# Patient Record
Sex: Male | Born: 1940 | Race: Black or African American | Hispanic: No | Marital: Single | Smoking: Former smoker
Health system: Southern US, Community
[De-identification: ages and names within clinical notes are randomized; demographics above are authoritative.]

## PROBLEM LIST (undated history)

## (undated) ENCOUNTER — Emergency Department (HOSPITAL_COMMUNITY): Disposition: A | Discharge status: Home / Self Care | Payer: Self-pay

## (undated) ENCOUNTER — Ambulatory Visit (HOSPITAL_COMMUNITY): Admission: EM | Payer: Medicare Other

## (undated) DIAGNOSIS — C61 Malignant neoplasm of prostate: Secondary | ICD-10-CM

## (undated) DIAGNOSIS — M1990 Unspecified osteoarthritis, unspecified site: Secondary | ICD-10-CM

## (undated) DIAGNOSIS — R3129 Other microscopic hematuria: Secondary | ICD-10-CM

## (undated) DIAGNOSIS — N182 Chronic kidney disease, stage 2 (mild): Secondary | ICD-10-CM

## (undated) DIAGNOSIS — K219 Gastro-esophageal reflux disease without esophagitis: Secondary | ICD-10-CM

## (undated) DIAGNOSIS — N400 Enlarged prostate without lower urinary tract symptoms: Secondary | ICD-10-CM

## (undated) DIAGNOSIS — I10 Essential (primary) hypertension: Secondary | ICD-10-CM

## (undated) DIAGNOSIS — J349 Unspecified disorder of nose and nasal sinuses: Secondary | ICD-10-CM

## (undated) DIAGNOSIS — E079 Disorder of thyroid, unspecified: Secondary | ICD-10-CM

## (undated) DIAGNOSIS — R399 Unspecified symptoms and signs involving the genitourinary system: Secondary | ICD-10-CM

## (undated) DIAGNOSIS — E039 Hypothyroidism, unspecified: Secondary | ICD-10-CM

## (undated) HISTORY — PX: CATARACT EXTRACTION W/ INTRAOCULAR LENS  IMPLANT, BILATERAL: SHX1307

---
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
## 2002-05-19 HISTORY — PX: COLONOSCOPY: SHX174

## 2003-02-06 ENCOUNTER — Ambulatory Visit (HOSPITAL_COMMUNITY): Admission: RE | Admit: 2003-02-06 | Discharge: 2003-02-06 | Payer: Self-pay | Admitting: Gastroenterology

## 2003-10-18 ENCOUNTER — Encounter: Admission: RE | Admit: 2003-10-18 | Discharge: 2003-10-18 | Payer: Self-pay | Admitting: Cardiology

## 2003-10-19 ENCOUNTER — Ambulatory Visit (HOSPITAL_COMMUNITY): Admission: RE | Admit: 2003-10-19 | Discharge: 2003-10-19 | Payer: Self-pay | Admitting: Cardiology

## 2003-12-31 ENCOUNTER — Emergency Department (HOSPITAL_COMMUNITY): Admission: EM | Admit: 2003-12-31 | Discharge: 2003-12-31 | Payer: Self-pay | Admitting: Emergency Medicine

## 2004-07-18 ENCOUNTER — Encounter: Admission: RE | Admit: 2004-07-18 | Discharge: 2004-07-18 | Payer: Self-pay | Admitting: Cardiology

## 2004-11-07 ENCOUNTER — Ambulatory Visit (HOSPITAL_COMMUNITY): Admission: RE | Admit: 2004-11-07 | Discharge: 2004-11-07 | Payer: Self-pay | Admitting: Cardiology

## 2006-05-19 ENCOUNTER — Emergency Department (HOSPITAL_COMMUNITY): Admission: EM | Admit: 2006-05-19 | Discharge: 2006-05-19 | Payer: Self-pay | Admitting: Emergency Medicine

## 2009-05-31 ENCOUNTER — Ambulatory Visit (HOSPITAL_BASED_OUTPATIENT_CLINIC_OR_DEPARTMENT_OTHER): Admission: RE | Admit: 2009-05-31 | Discharge: 2009-05-31 | Payer: Self-pay | Admitting: Cardiology

## 2009-06-09 ENCOUNTER — Ambulatory Visit: Payer: Self-pay | Admitting: Internal Medicine

## 2010-10-04 NOTE — Op Note (Signed)
NAME:  Adrian Ellis, Adrian Ellis                          ACCOUNT NO.:  [id number]   MEDICAL RECORD NO.:  [id number]                   PATIENT TYPE:  AMB   LOCATION:  ENDO                                 FACILITY:  [facility]   PHYSICIAN:  Anselmo Rod, M.D.               DATE OF BIRTH:  08/30/1940   DATE OF PROCEDURE:  02/06/2003  DATE OF DISCHARGE:                                 OPERATIVE REPORT   PROCEDURE PERFORMED:  Screening colonoscopy.   ENDOSCOPIST:  Anselmo Rod, M.D.   INSTRUMENT USED:  Olympus video colonoscope.   INDICATION FOR PROCEDURE:  A 70 year old African-American male undergoing  screening colonoscopy.  Rule out colonic polyps, masses, etc.   PREPROCEDURE PREPARATION:  Informed consent was procured from the patient.  The patient was fasted for eight hours prior to the procedure and prepped  with a bottle of magnesium citrate and a gallon of GoLYTELY the night prior  to the procedure.   PREPROCEDURE PHYSICAL:  VITAL SIGNS:  The patient had stable vital signs.  NECK:  Supple.  CHEST:  Clear to auscultation.  S1, S2 regular.  ABDOMEN:  Soft with normal bowel sounds.   DESCRIPTION OF PROCEDURE:  The patient was placed in the left lateral  decubitus position and sedated with 50 mg of Demerol and 7 mg of Versed  intravenously.  Once the patient was adequately sedate and maintained on low-  flow oxygen and continuous cardiac monitoring, the Olympus video colonoscope  was advanced from the rectum to the cecum with difficulty.  There was a  large amount of residual stool especially in  the cecum and right colon.  The cecal base was not adequately visualized; however, multiple washes were  done, the patient's position was changed from the left lateral to the supine  position, and gentle application of abdominal pressure to reach the cecal  base.  No masses, polyps, erosions, or diverticula were seen.  Small  internal hemorrhoids were seen on retroflexion in the rectum.  The  patient  tolerated the procedure well without complication.   IMPRESSION:  1. Essentially unrevealing colonoscopy except for small internal     hemorrhoids.  2. No masses, polyps, or diverticulosis noted.  3. Large amount of residual stool in the colon, small lesions could have     been missed.    RECOMMENDATIONS:  1. Repeat CRC screening is recommended in the next five years unless the     patient develops any abnormal symptoms in the interim.  2. Outpatient follow-up as need arises in the future.                                               Anselmo Rod, M.D.    JNM/MEDQ  D:  02/06/2003  T:  02/07/2003  Job:  [id number]   cc:   Gabriel Earing, M.D.  [address]  [place]  [place] [address]  Fax: [phone]   Osvaldo Shipper. Spruill, M.D.  [address] [address]  [place]  [place] [address]  Fax: [phone]

## 2012-01-17 ENCOUNTER — Emergency Department (HOSPITAL_COMMUNITY)
Admission: EM | Admit: 2012-01-17 | Discharge: 2012-01-17 | Disposition: A | Discharge status: Home / Self Care | Payer: Medicare Other | Source: Home / Self Care | Attending: Family Medicine | Admitting: Family Medicine

## 2012-01-17 ENCOUNTER — Encounter (HOSPITAL_COMMUNITY): Payer: Self-pay

## 2012-01-17 DIAGNOSIS — J309 Allergic rhinitis, unspecified: Secondary | ICD-10-CM

## 2012-01-17 DIAGNOSIS — N400 Enlarged prostate without lower urinary tract symptoms: Secondary | ICD-10-CM | POA: Insufficient documentation

## 2012-01-17 DIAGNOSIS — J302 Other seasonal allergic rhinitis: Secondary | ICD-10-CM

## 2012-01-17 HISTORY — DX: Disorder of thyroid, unspecified: E07.9

## 2012-01-17 HISTORY — DX: Essential (primary) hypertension: I10

## 2012-01-17 HISTORY — DX: Benign prostatic hyperplasia without lower urinary tract symptoms: N40.0

## 2012-01-17 MED ORDER — FEXOFENADINE HCL 180 MG PO TABS: 180.0000 mg | ORAL_TABLET | Freq: Every day | ORAL | Status: DC

## 2012-01-17 MED ORDER — AZELASTINE HCL 0.1 % NA SOLN: 1.0000 | Freq: Two times a day (BID) | NASAL | Status: DC

## 2012-01-17 NOTE — ED Provider Notes (Signed)
History     CSN: [id number]  Arrival date & time 01/17/12  1128   First MD Initiated Contact with Patient 01/17/12 1220      Chief Complaint  Patient presents with  . Nasal Congestion    nasal congestion started two days ago    (Consider location/radiation/quality/duration/timing/severity/associated sxs/prior treatment) Patient is a 71 y.o. male presenting with URI. The history is provided by the patient.  URI The primary symptoms include sore throat and cough. Primary symptoms do not include fever, nausea, vomiting, myalgias or rash. The current episode started 2 days ago. This is a new problem. The problem has not changed since onset. Symptoms associated with the illness include congestion and rhinorrhea. The illness is not associated with chills.    Past Medical History  Diagnosis Date  . Hypertension   . Thyroid disease   . Prostate enlargement     Past Surgical History  Procedure Date  . Tonsillectomy     No family history on file.  History  Substance Use Topics  . Smoking status: Never Smoker   . Smokeless tobacco: Not on file  . Alcohol Use: No      Review of Systems  Constitutional: Negative for fever and chills.  HENT: Positive for congestion, sore throat, rhinorrhea and postnasal drip.   Respiratory: Positive for cough.   Gastrointestinal: Negative for nausea and vomiting.  Musculoskeletal: Negative for myalgias.  Skin: Negative for rash.    Allergies  Review of patient's allergies indicates no known allergies.  Home Medications   Current Outpatient Rx  Name Route Sig Dispense Refill  . AMLODIPINE-OLMESARTAN 10-20 MG PO TABS Oral Take 1 tablet by mouth daily.    . ASPIRIN 81 MG PO TABS Oral Take 81 mg by mouth daily.    . DUTASTERIDE 0.5 MG PO CAPS Oral Take 0.5 mg by mouth daily.    Marland Kitchen LEVOTHYROXINE SODIUM 100 MCG PO TABS Oral Take 100 mcg by mouth daily.    . CENTRUM PO CHEW Oral Chew 1 tablet by mouth daily.    . AZELASTINE HCL 137  MCG/SPRAY NA SOLN Nasal Place 1 spray into the nose 2 (two) times daily. Use in each nostril as directed 30 mL 1  . FEXOFENADINE HCL 180 MG PO TABS Oral Take 1 tablet (180 mg total) by mouth daily. 30 tablet 1    BP 133/63  Pulse 70  Temp 98.1 F (36.7 C) (Oral)  Resp 20  SpO2 99%  Physical Exam  Nursing note and vitals reviewed. Constitutional: He is oriented to person, place, and time. He appears well-developed and well-nourished.  HENT:  Head: Normocephalic.  Right Ear: External ear normal.  Left Ear: External ear normal.  Nose: Mucosal edema and rhinorrhea present.  Mouth/Throat: Oropharynx is clear and moist.  Eyes: Pupils are equal, round, and reactive to light.  Neck: Normal range of motion. Neck supple.  Cardiovascular: Normal rate, regular rhythm, normal heart sounds and intact distal pulses.   Pulmonary/Chest: Effort normal and breath sounds normal.  Lymphadenopathy:    He has no cervical adenopathy.  Neurological: He is alert and oriented to person, place, and time.  Skin: Skin is warm and dry.  Psychiatric: He has a normal mood and affect.    ED Course  Procedures (including critical care time)  Labs Reviewed - No data to display No results found.   1. Seasonal allergies       MDM          Fayrene Fearing  Sallyanne Kuster, MD 01/17/12 1256

## 2012-01-17 NOTE — ED Notes (Signed)
Pt states he has a "stuffy"nose, with drainage which started a couple of days ago, denies fever/n/v. States throat was sore earlier.

## 2012-05-16 ENCOUNTER — Encounter (HOSPITAL_COMMUNITY): Payer: Self-pay | Admitting: Family Medicine

## 2012-05-16 ENCOUNTER — Emergency Department (HOSPITAL_COMMUNITY)
Admission: EM | Admit: 2012-05-16 | Discharge: 2012-05-16 | Disposition: A | Discharge status: Home / Self Care | Payer: Medicare Other | Attending: Emergency Medicine | Admitting: Emergency Medicine

## 2012-05-16 DIAGNOSIS — R059 Cough, unspecified: Secondary | ICD-10-CM | POA: Insufficient documentation

## 2012-05-16 DIAGNOSIS — I10 Essential (primary) hypertension: Secondary | ICD-10-CM | POA: Insufficient documentation

## 2012-05-16 DIAGNOSIS — Z79899 Other long term (current) drug therapy: Secondary | ICD-10-CM | POA: Insufficient documentation

## 2012-05-16 DIAGNOSIS — Z7982 Long term (current) use of aspirin: Secondary | ICD-10-CM | POA: Insufficient documentation

## 2012-05-16 DIAGNOSIS — E079 Disorder of thyroid, unspecified: Secondary | ICD-10-CM | POA: Insufficient documentation

## 2012-05-16 DIAGNOSIS — J069 Acute upper respiratory infection, unspecified: Secondary | ICD-10-CM | POA: Insufficient documentation

## 2012-05-16 DIAGNOSIS — R6883 Chills (without fever): Secondary | ICD-10-CM | POA: Insufficient documentation

## 2012-05-16 DIAGNOSIS — R05 Cough: Secondary | ICD-10-CM | POA: Insufficient documentation

## 2012-05-16 MED ORDER — GUAIFENESIN 100 MG/5ML PO SYRP: 100.0000 mg | ORAL_SOLUTION | ORAL | Status: DC | PRN

## 2012-05-16 NOTE — ED Provider Notes (Signed)
History    This chart was scribed for [employer]. Rubin Payor, MD, MD by Smitty Pluck, ED Scribe. The patient was seen in room TR06C/TR06C and the patient's care was started at 11:03AM.   CSN: [id number]  Arrival date & time 05/16/12  1308       Chief Complaint  Patient presents with  . Nasal Congestion    (Consider location/radiation/quality/duration/timing/severity/associated sxs/prior treatment) The history is provided by the patient. No language interpreter was used.   Adrian Ellis is a 70 y.o. male who presents to the Emergency Department complaining of constant, moderate nasal congestion onset 1 week ago. He reports having non-productive cough.  Pt denies fevers, SOB, nausea, vomiting, hx of COPD, hx of emphysema and any other symptoms.   Past Medical History  Diagnosis Date  . Hypertension   . Thyroid disease   . Prostate enlargement     Past Surgical History  Procedure Date  . Tonsillectomy     History reviewed. No pertinent family history.  History  Substance Use Topics  . Smoking status: Never Smoker   . Smokeless tobacco: Not on file  . Alcohol Use: No      Review of Systems  Constitutional: Positive for chills. Negative for fever.  HENT: Positive for congestion.   Respiratory: Positive for cough. Negative for shortness of breath.   Gastrointestinal: Negative for nausea and vomiting.  Neurological: Negative for weakness.  All other systems reviewed and are negative.    Allergies  Review of patient's allergies indicates no known allergies.  Home Medications   Current Outpatient Rx  Name  Route  Sig  Dispense  Refill  . AMLODIPINE-OLMESARTAN 10-20 MG PO TABS   Oral   Take 1 tablet by mouth daily.         . ASPIRIN 81 MG PO CHEW   Oral   Chew 81 mg by mouth daily.         . DUTASTERIDE 0.5 MG PO CAPS   Oral   Take 0.5 mg by mouth daily.         Marland Kitchen LEVOTHYROXINE SODIUM 112 MCG PO TABS   Oral   Take 112 mcg by mouth daily.         . ADULT MULTIVITAMIN W/MINERALS CH   Oral   Take 1 tablet by mouth daily.           BP 124/53  Pulse 80  Temp 98.3 F (36.8 C)  Resp 18  SpO2 100%  Physical Exam  Nursing note and vitals reviewed. Constitutional: He is oriented to person, place, and time. He appears well-developed and well-nourished. No distress.  HENT:  Head: Normocephalic and atraumatic.  Mouth/Throat: Oropharynx is clear and moist.  Eyes: EOM are normal.  Neck: Neck supple. No tracheal deviation present.  Cardiovascular: Normal rate.   Pulmonary/Chest: Effort normal. No respiratory distress. He has no wheezes. He has no rales.       Harsh breath sounds  Musculoskeletal: Normal range of motion.  Neurological: He is alert and oriented to person, place, and time.  Skin: Skin is warm and dry.  Psychiatric: He has a normal mood and affect. His behavior is normal.    ED Course  Procedures (including critical care time)   COORDINATION OF CARE: 11:05 AM Discussed ED treatment with pt     Labs Reviewed - No data to display No results found.   No diagnosis found.    MDM  URI symptoms. No focal findings on exam. Do  not believe it strep or influenza. Will discharge home with cough medicine  I personally performed the services described in this documentation, which was scribed in my presence. The recorded information has been reviewed and is accurate.         Juliet Rude. Rubin Payor, MD 05/16/12 [phone]

## 2012-05-16 NOTE — ED Notes (Signed)
Per pt nasal and chest congestion. sts dry cough

## 2012-09-04 ENCOUNTER — Emergency Department (HOSPITAL_COMMUNITY)
Admission: EM | Admit: 2012-09-04 | Discharge: 2012-09-04 | Disposition: A | Discharge status: Home / Self Care | Payer: Medicare Other | Attending: Emergency Medicine | Admitting: Emergency Medicine

## 2012-09-04 ENCOUNTER — Encounter (HOSPITAL_COMMUNITY): Payer: Self-pay | Admitting: Neurology

## 2012-09-04 DIAGNOSIS — Z79899 Other long term (current) drug therapy: Secondary | ICD-10-CM | POA: Insufficient documentation

## 2012-09-04 DIAGNOSIS — I10 Essential (primary) hypertension: Secondary | ICD-10-CM | POA: Insufficient documentation

## 2012-09-04 DIAGNOSIS — Z7982 Long term (current) use of aspirin: Secondary | ICD-10-CM | POA: Insufficient documentation

## 2012-09-04 DIAGNOSIS — E079 Disorder of thyroid, unspecified: Secondary | ICD-10-CM | POA: Insufficient documentation

## 2012-09-04 DIAGNOSIS — Z87448 Personal history of other diseases of urinary system: Secondary | ICD-10-CM | POA: Insufficient documentation

## 2012-09-04 DIAGNOSIS — L989 Disorder of the skin and subcutaneous tissue, unspecified: Secondary | ICD-10-CM

## 2012-09-04 NOTE — ED Notes (Signed)
Pt has small sore to right inner aspect of heel. States yesterday in shower started bleeding. Covered with band aid. A x 4.

## 2012-09-04 NOTE — ED Provider Notes (Signed)
History     CSN: [id number]  Arrival date & time 09/04/12  0917   First MD Initiated Contact with Patient 09/04/12 0930      Chief Complaint  Patient presents with  . Foot Pain    (Consider location/radiation/quality/duration/timing/severity/associated sxs/prior treatment) HPI 72 year old male presents with two skin lesion concerns. For several months if not years he has had a skin tag in his right axilla and for several months if not longer a small lesion that he does not remember what looks like it was on his right medial foot heal and fell off yesterday while he was in the shower and now he just has a small crater in his skin where the lesion was, he has no pain no surrounding redness no pus drainage no weakness no numbness no fever no other concerns whatsoever.No treatment PTA. Past Medical History  Diagnosis Date  . Hypertension   . Thyroid disease   . Prostate enlargement     Past Surgical History  Procedure Laterality Date  . Tonsillectomy      No family history on file.  History  Substance Use Topics  . Smoking status: Never Smoker   . Smokeless tobacco: Not on file  . Alcohol Use: No      Review of Systems See HPI. Allergies  Review of patient's allergies indicates no known allergies.  Home Medications   Current Outpatient Rx  Name  Route  Sig  Dispense  Refill  . amlodipine-olmesartan (AZOR) 10-20 MG per tablet   Oral   Take 1 tablet by mouth daily.         Marland Kitchen aspirin EC 81 MG tablet   Oral   Take 81 mg by mouth daily.         Marland Kitchen dutasteride (AVODART) 0.5 MG capsule   Oral   Take 0.5 mg by mouth daily.         Marland Kitchen levothyroxine (SYNTHROID, LEVOTHROID) 112 MCG tablet   Oral   Take 112 mcg by mouth daily.         . Multiple Vitamin (MULTIVITAMIN WITH MINERALS) TABS   Oral   Take 1 tablet by mouth daily.           BP 144/65  Pulse 80  Temp(Src) 98.2 F (36.8 C) (Oral)  Resp 20  SpO2 98%  Physical Exam  Nursing note and vitals  reviewed. Constitutional:  Awake, alert, nontoxic appearance.  HENT:  Head: Atraumatic.  Eyes: Right eye exhibits no discharge. Left eye exhibits no discharge.  Neck: Neck supple.  Cardiovascular: Normal rate and regular rhythm.   No murmur heard. Pulmonary/Chest: Effort normal and breath sounds normal. No respiratory distress. He has no wheezes. He has no rales. He exhibits no tenderness.  Abdominal: Soft. There is no tenderness. There is no rebound.  Musculoskeletal: He exhibits no tenderness.  Baseline ROM, no obvious new focal weakness.  Neurological: He is alert.  Mental status and motor strength appears baseline for patient and situation.  Skin: No rash noted.  Right axilla has a 1 cm diameter pedunculated well-rounded brown skin tag without erythema tenderness fluctuance or purulent drainage which appears to be low likelihood of a cancerous lesion; his right medial foot at the heel shows a 5 mm diameter well rounded circumscribed shallow ulcer with no erythema no purulence no fluctuance or skin lesion had sloughed spontaneously with residual lesion of uncertain etiology with dorsalis pedis pulse intact normal light touch Refill less than 2 seconds good movement of  his foot no tenderness and also I suspect low likelihood of malignancy however recommend patient follow up with a dermatologist of his choice  Psychiatric: He has a normal mood and affect.    ED Course  Procedures (including critical care time)  Labs Reviewed - No data to display No results found.   1. Foot lesion       MDM  Patient / Family / Caregiver informed of clinical course, understand medical decision-making process, and agree with plan.  I doubt any other EMC precluding discharge at this time including, but not necessarily limited to the following:SBI.         Hurman Horn, MD 09/10/12 [phone]

## 2013-04-23 ENCOUNTER — Observation Stay (HOSPITAL_COMMUNITY)
Admission: EM | Admit: 2013-04-23 | Discharge: 2013-04-24 | Disposition: A | Discharge status: Home / Self Care | Payer: Medicare Other | Attending: Internal Medicine | Admitting: Internal Medicine

## 2013-04-23 ENCOUNTER — Other Ambulatory Visit: Payer: Self-pay

## 2013-04-23 ENCOUNTER — Encounter (HOSPITAL_COMMUNITY): Payer: Self-pay | Admitting: Emergency Medicine

## 2013-04-23 DIAGNOSIS — Z87891 Personal history of nicotine dependence: Secondary | ICD-10-CM | POA: Insufficient documentation

## 2013-04-23 DIAGNOSIS — Z79899 Other long term (current) drug therapy: Secondary | ICD-10-CM | POA: Insufficient documentation

## 2013-04-23 DIAGNOSIS — N400 Enlarged prostate without lower urinary tract symptoms: Secondary | ICD-10-CM

## 2013-04-23 DIAGNOSIS — R55 Syncope and collapse: Principal | ICD-10-CM | POA: Insufficient documentation

## 2013-04-23 DIAGNOSIS — I10 Essential (primary) hypertension: Secondary | ICD-10-CM | POA: Insufficient documentation

## 2013-04-23 DIAGNOSIS — J329 Chronic sinusitis, unspecified: Secondary | ICD-10-CM | POA: Insufficient documentation

## 2013-04-23 DIAGNOSIS — R42 Dizziness and giddiness: Secondary | ICD-10-CM | POA: Insufficient documentation

## 2013-04-23 DIAGNOSIS — Z7982 Long term (current) use of aspirin: Secondary | ICD-10-CM | POA: Insufficient documentation

## 2013-04-23 DIAGNOSIS — E079 Disorder of thyroid, unspecified: Secondary | ICD-10-CM | POA: Insufficient documentation

## 2013-04-23 HISTORY — DX: Unspecified disorder of nose and nasal sinuses: J34.9

## 2013-04-23 LAB — CBC WITH DIFFERENTIAL/PLATELET
Basophils Relative: 0 % (ref 0–1)
Eosinophils Absolute: 0.2 10*3/uL (ref 0.0–0.7)
HCT: 40 % (ref 39.0–52.0)
Lymphocytes Relative: 15 % (ref 12–46)
MCV: 92 fL (ref 78.0–100.0)
Neutro Abs: 8.2 10*3/uL — ABNORMAL HIGH (ref 1.7–7.7)
Platelets: 216 10*3/uL (ref 150–400)

## 2013-04-23 LAB — CBC
HCT: 39.6 % (ref 39.0–52.0)
Hemoglobin: 13.4 g/dL (ref 13.0–17.0)
MCHC: 33.8 g/dL (ref 30.0–36.0)
RBC: 4.31 MIL/uL (ref 4.22–5.81)
WBC: 10 10*3/uL (ref 4.0–10.5)

## 2013-04-23 LAB — GLUCOSE, CAPILLARY: Glucose-Capillary: 136 mg/dL — ABNORMAL HIGH (ref 70–99)

## 2013-04-23 LAB — CREATININE, SERUM
Creatinine, Ser: 1.14 mg/dL (ref 0.50–1.35)
GFR calc Af Amer: 72 mL/min — ABNORMAL LOW (ref >90)
GFR calc non Af Amer: 62 mL/min — ABNORMAL LOW (ref >90)

## 2013-04-23 LAB — POCT I-STAT, CHEM 8
Calcium, Ion: 1.21 mmol/L (ref 1.13–1.30)
Chloride: 101 mEq/L (ref 96–112)
Creatinine, Ser: 1.4 mg/dL — ABNORMAL HIGH (ref 0.50–1.35)
Glucose, Bld: 124 mg/dL — ABNORMAL HIGH (ref 70–99)
HCT: 44 % (ref 39.0–52.0)
Hemoglobin: 15 g/dL (ref 13.0–17.0)
Potassium: 4.6 mEq/L (ref 3.5–5.1)
TCO2: 24 mmol/L (ref 0–100)

## 2013-04-23 LAB — TROPONIN I
Troponin I: <0.3 ng/mL (ref <0.30)
Troponin I: <0.3 ng/mL (ref <0.30)

## 2013-04-23 MED ORDER — ENOXAPARIN SODIUM 40 MG/0.4ML ~~LOC~~ SOLN
40.0000 mg | SUBCUTANEOUS | Status: DC
  Administered 2013-04-23: 40 mg via SUBCUTANEOUS
  Filled 2013-04-23 (×2): qty 0.4

## 2013-04-23 MED ORDER — TAMSULOSIN HCL 0.4 MG PO CAPS
0.4000 mg | ORAL_CAPSULE | Freq: Every day | ORAL | Status: DC
  Administered 2013-04-23 – 2013-04-24 (×2): 0.4 mg via ORAL
  Filled 2013-04-23 (×2): qty 1

## 2013-04-23 MED ORDER — IRBESARTAN 150 MG PO TABS
150.0000 mg | ORAL_TABLET | Freq: Every day | ORAL | Status: DC
  Administered 2013-04-24: 150 mg via ORAL
  Filled 2013-04-23: qty 1

## 2013-04-23 MED ORDER — SODIUM CHLORIDE 0.9 % IV SOLN: 250.0000 mL | INTRAVENOUS | Status: DC | PRN

## 2013-04-23 MED ORDER — ACETAMINOPHEN 325 MG PO TABS: 650.0000 mg | ORAL_TABLET | Freq: Four times a day (QID) | ORAL | Status: DC | PRN

## 2013-04-23 MED ORDER — AMLODIPINE-OLMESARTAN 10-20 MG PO TABS: 1.0000 | ORAL_TABLET | Freq: Every day | ORAL | Status: DC

## 2013-04-23 MED ORDER — AMLODIPINE BESYLATE 10 MG PO TABS
10.0000 mg | ORAL_TABLET | Freq: Every day | ORAL | Status: DC
  Administered 2013-04-24: 10 mg via ORAL
  Filled 2013-04-23: qty 1

## 2013-04-23 MED ORDER — ONDANSETRON HCL 4 MG/2ML IJ SOLN: 4.0000 mg | Freq: Four times a day (QID) | INTRAMUSCULAR | Status: DC | PRN

## 2013-04-23 MED ORDER — LEVOTHYROXINE SODIUM 112 MCG PO TABS
112.0000 ug | ORAL_TABLET | Freq: Every day | ORAL | Status: DC
  Administered 2013-04-24: 112 ug via ORAL
  Filled 2013-04-23 (×2): qty 1

## 2013-04-23 MED ORDER — SODIUM CHLORIDE 0.9 % IJ SOLN
3.0000 mL | Freq: Two times a day (BID) | INTRAMUSCULAR | Status: DC
  Administered 2013-04-23 – 2013-04-24 (×3): 3 mL via INTRAVENOUS

## 2013-04-23 MED ORDER — DUTASTERIDE 0.5 MG PO CAPS
0.5000 mg | ORAL_CAPSULE | Freq: Every day | ORAL | Status: DC
  Administered 2013-04-24: 0.5 mg via ORAL
  Filled 2013-04-23: qty 1

## 2013-04-23 MED ORDER — ONDANSETRON HCL 4 MG PO TABS: 4.0000 mg | ORAL_TABLET | Freq: Four times a day (QID) | ORAL | Status: DC | PRN

## 2013-04-23 MED ORDER — ACETAMINOPHEN 650 MG RE SUPP: 650.0000 mg | Freq: Four times a day (QID) | RECTAL | Status: DC | PRN

## 2013-04-23 MED ORDER — ASPIRIN EC 81 MG PO TBEC
81.0000 mg | DELAYED_RELEASE_TABLET | Freq: Every day | ORAL | Status: DC
  Administered 2013-04-24: 81 mg via ORAL
  Filled 2013-04-23: qty 1

## 2013-04-23 MED ORDER — SODIUM CHLORIDE 0.9 % IJ SOLN: 3.0000 mL | INTRAMUSCULAR | Status: DC | PRN

## 2013-04-23 NOTE — H&P (Addendum)
PCP:   Evlyn Courier, MD   Chief Complaint:  Passed out  HPI: 72 year old male with history of hypertension him a BPH who was [place]. Patient had become lightheaded immediately prior to passing out and lost consciousness for brief time. He denies seizure like activity. Does not have any cardiac history denies palpitations. He denies chest pain or shortness of breath. Patient also denies nausea vomiting or diarrhea. Patient has a history of BPH and was started on Flomax yesterday by his urologist,  he took first dose of Flomax last night.  Allergies:  No Known Allergies    Past Medical History  Diagnosis Date  . Hypertension   . Thyroid disease   . Prostate enlargement   . Sinus problem     Past Surgical History  Procedure Laterality Date  . Tonsillectomy      Prior to Admission medications   Medication Sig Start Date End Date Taking? Authorizing Provider  amlodipine-olmesartan (AZOR) 10-20 MG per tablet Take 1 tablet by mouth daily.   Yes Historical Provider, MD  aspirin EC 81 MG tablet Take 81 mg by mouth daily.   Yes Historical Provider, MD  dutasteride (AVODART) 0.5 MG capsule Take 0.5 mg by mouth daily.   Yes Historical Provider, MD  levothyroxine (SYNTHROID, LEVOTHROID) 112 MCG tablet Take 112 mcg by mouth daily.   Yes Historical Provider, MD  Multiple Vitamin (MULTIVITAMIN WITH MINERALS) TABS Take 1 tablet by mouth daily.   Yes Historical Provider, MD  tamsulosin (FLOMAX) 0.4 MG CAPS capsule Take 0.4 mg by mouth daily.   Yes Historical Provider, MD    Social History:  reports that he has quit smoking. He does not have any smokeless tobacco history on file. He reports that he does not drink alcohol or use illicit drugs.  No family history on file.   All the positives are listed in BOLD  Review of Systems:  HEENT: Headache, blurred vision, runny nose, sore throat Neck: Hypothyroidism,  hyperthyroidism,,lymphadenopathy Chest : Shortness of breath, history of COPD, Asthma Heart : Chest pain, history of coronary arterey disease GI:  Nausea, vomiting, diarrhea, constipation, GERD GU: Dysuria, urgency, frequency of urination, hematuria, BPH Neuro: Stroke, seizures, syncope Psych: Depression, anxiety, hallucinations   Physical Exam: Blood pressure 142/60, pulse 75, temperature 97.8 F (36.6 C), temperature source Oral, resp. rate 17, SpO2 98.00%. Constitutional:   Patient is a well-developed and well-nourished male in no acute distress and cooperative with exam. Head: Normocephalic and atraumatic Mouth: Mucus membranes moist Eyes: PERRL, EOMI, conjunctivae normal Neck: Supple, No Thyromegaly Cardiovascular: RRR, S1 normal, S2 normal Pulmonary/Chest: CTAB, no wheezes, rales, or rhonchi Abdominal: Soft. Non-tender, non-distended, bowel sounds are normal, no masses, organomegaly, or guarding present.  Neurological: A&O x3, Strenght is normal and symmetric bilaterally, cranial nerve II-XII are grossly intact, no focal motor deficit, sensory intact to light touch bilaterally.  Extremities : No Cyanosis, Clubbing or Edema   Labs on Admission:  Results for orders placed during the hospital encounter of 04/23/13 (from the past 48 hour(s))  GLUCOSE, CAPILLARY     Status: Abnormal   Collection Time    04/23/13  9:08 AM      Result Value Range   Glucose-Capillary 136 (*) 70 - 99 mg/dL  CBC WITH DIFFERENTIAL     Status: Abnormal   Collection Time    04/23/13 10:10 AM      Result Value Range  WBC 10.3  4.0 - 10.5 K/uL   RBC 4.35  4.22 - 5.81 MIL/uL   Hemoglobin 13.7  13.0 - 17.0 g/dL   HCT 16.1  09.6 - 04.5 %   MCV 92.0  78.0 - 100.0 fL   MCH 31.5  26.0 - 34.0 pg   MCHC 34.3  30.0 - 36.0 g/dL   RDW 40.9  81.1 - 91.4 %   Platelets 216  150 - 400 K/uL   Neutrophils Relative % 79 (*) 43 - 77 %   Neutro Abs 8.2 (*) 1.7 - 7.7 K/uL   Lymphocytes Relative 15  12 - 46 %    Lymphs Abs 1.5  0.7 - 4.0 K/uL   Monocytes Relative 4  3 - 12 %   Monocytes Absolute 0.4  0.1 - 1.0 K/uL   Eosinophils Relative 2  0 - 5 %   Eosinophils Absolute 0.2  0.0 - 0.7 K/uL   Basophils Relative 0  0 - 1 %   Basophils Absolute 0.0  0.0 - 0.1 K/uL  POCT I-STAT TROPONIN I     Status: None   Collection Time    04/23/13 10:20 AM      Result Value Range   Troponin i, poc 0.01  0.00 - 0.08 ng/mL   Comment 3            Comment: Due to the release kinetics of cTnI,     a negative result within the first hours     of the onset of symptoms does not rule out     myocardial infarction with certainty.     If myocardial infarction is still suspected,     repeat the test at appropriate intervals.  POCT I-STAT, CHEM 8     Status: Abnormal   Collection Time    04/23/13 10:23 AM      Result Value Range   Sodium 139  135 - 145 mEq/L   Potassium 4.6  3.5 - 5.1 mEq/L   Chloride 101  96 - 112 mEq/L   BUN 17  6 - 23 mg/dL   Creatinine, Ser 7.82 (*) 0.50 - 1.35 mg/dL   Glucose, Bld 956 (*) 70 - 99 mg/dL   Calcium, Ion 2.13  0.86 - 1.30 mmol/L   TCO2 24  0 - 100 mmol/L   Hemoglobin 15.0  13.0 - 17.0 g/dL   HCT 57.8  46.9 - 62.9 %    Radiological Exams on Admission: No results found.  Assessment/Plan Active Problems:   Syncope Hypertension BPH Hypothyroidism  72 year old male with history of hypertension, BPH will be admitted to telemetry for syncope. We'll obtain 3 sets of cardiac enzymes, 2-D echocardiogram. Patient may have had postural hypotension as he was started on Flomax last night. I will continue the Flomax at this time and also obtain orthostatics every shift. If patient is found to be orthostatic, Flomax should be discontinued. I will also continue antihypertensive regimen including amlodipine and Olmesartan. We'll continue Synthroid for hypothyroidism Will also continue Flomax and Avodart.  EKG: NSR, poor R wave progression  Code status: Presumed full code  Family  discussion: No family at bedside   Time Spent on Admission: 60 min  LAMA,GAGAN S [facility] Pager: [phone] 04/23/2013, 12:45 PM  If 7PM-7AM, please contact night-coverage  [email or web address]  Password [email or web address]

## 2013-04-23 NOTE — ED Notes (Signed)
Admitting MD at bedside.

## 2013-04-23 NOTE — ED Provider Notes (Signed)
CSN: [id number]     Arrival date & time 04/23/13  1308 History   First MD Initiated Contact with Patient 04/23/13 0902     Chief Complaint  Patient presents with  . Loss of Consciousness   (Consider location/radiation/quality/duration/timing/severity/associated sxs/prior Treatment) HPI Patient had syncopal event this morning. Lungs the costs unknown duration. Brought by EMS. He became lightheaded immediately prior to his syncopal event sat down in a chair and then had loss of consciousness. Patient started Flomax last night. He is presently asymptomatic. Denies having had any chest pain abdominal pain no diarrhea no other complaint. Past Medical History  Diagnosis Date  . Hypertension   . Thyroid disease   . Prostate enlargement   . Sinus problem    Past Surgical History  Procedure Laterality Date  . Tonsillectomy     No family history on file. History  Substance Use Topics  . Smoking status: Former Games developer  . Smokeless tobacco: Not on file  . Alcohol Use: No    Review of Systems  Constitutional: Negative.   HENT: Negative.   Respiratory: Negative.   Cardiovascular: Negative.   Gastrointestinal: Negative.   Musculoskeletal: Negative.   Skin: Negative.   Neurological: Positive for light-headedness.  Psychiatric/Behavioral: Negative.   All other systems reviewed and are negative.    Allergies  Review of patient's allergies indicates no known allergies.  Home Medications   Current Outpatient Rx  Name  Route  Sig  Dispense  Refill  . amlodipine-olmesartan (AZOR) 10-20 MG per tablet   Oral   Take 1 tablet by mouth daily.         Marland Kitchen aspirin EC 81 MG tablet   Oral   Take 81 mg by mouth daily.         Marland Kitchen dutasteride (AVODART) 0.5 MG capsule   Oral   Take 0.5 mg by mouth daily.         Marland Kitchen levothyroxine (SYNTHROID, LEVOTHROID) 112 MCG tablet   Oral   Take 112 mcg by mouth daily.         . Multiple Vitamin (MULTIVITAMIN WITH MINERALS) TABS   Oral   Take 1  tablet by mouth daily.         . tamsulosin (FLOMAX) 0.4 MG CAPS capsule   Oral   Take 0.4 mg by mouth daily.          BP 154/46  Pulse 72  Temp(Src) 97.8 F (36.6 C) (Oral)  Resp 16  SpO2 98% Physical Exam  Nursing note and vitals reviewed. Constitutional: He appears well-developed and well-nourished.  HENT:  Head: Normocephalic and atraumatic.  Eyes: Conjunctivae are normal. Pupils are equal, round, and reactive to light.  Neck: Neck supple. No tracheal deviation present. No thyromegaly present.  Cardiovascular: Normal rate and regular rhythm.   No murmur heard. Pulmonary/Chest: Effort normal and breath sounds normal.  Abdominal: Soft. Bowel sounds are normal. He exhibits no distension. There is no tenderness.  Musculoskeletal: Normal range of motion. He exhibits no edema and no tenderness.  Neurological: He is alert. Coordination normal.  Skin: Skin is warm and dry. No rash noted.  Psychiatric: He has a normal mood and affect.    ED Course  Procedures (including critical care time) Labs Review Labs Reviewed  GLUCOSE, CAPILLARY - Abnormal; Notable for the following:    Glucose-Capillary 136 (*)    All other components within normal limits  CBC WITH DIFFERENTIAL   Imaging Review No results found.  EKG Interpretation  None       Date: 04/23/2013  Rate: 80  Rhythm: normal sinus rhythm  QRS Axis: normal  Intervals: normal  ST/T Wave abnormalities: normal  Conduction Disutrbances:none  Narrative Interpretation:   Old EKG Reviewed: none available  Results for orders placed during the hospital encounter of 04/23/13  GLUCOSE, CAPILLARY      Result Value Range   Glucose-Capillary 136 (*) 70 - 99 mg/dL  CBC WITH DIFFERENTIAL      Result Value Range   WBC 10.3  4.0 - 10.5 K/uL   RBC 4.35  4.22 - 5.81 MIL/uL   Hemoglobin 13.7  13.0 - 17.0 g/dL   HCT 16.1  09.6 - 04.5 %   MCV 92.0  78.0 - 100.0 fL   MCH 31.5  26.0 - 34.0 pg   MCHC 34.3  30.0 - 36.0 g/dL    RDW 40.9  81.1 - 91.4 %   Platelets 216  150 - 400 K/uL   Neutrophils Relative % 79 (*) 43 - 77 %   Neutro Abs 8.2 (*) 1.7 - 7.7 K/uL   Lymphocytes Relative 15  12 - 46 %   Lymphs Abs 1.5  0.7 - 4.0 K/uL   Monocytes Relative 4  3 - 12 %   Monocytes Absolute 0.4  0.1 - 1.0 K/uL   Eosinophils Relative 2  0 - 5 %   Eosinophils Absolute 0.2  0.0 - 0.7 K/uL   Basophils Relative 0  0 - 1 %   Basophils Absolute 0.0  0.0 - 0.1 K/uL  POCT I-STAT, CHEM 8      Result Value Range   Sodium 139  135 - 145 mEq/L   Potassium 4.6  3.5 - 5.1 mEq/L   Chloride 101  96 - 112 mEq/L   BUN 17  6 - 23 mg/dL   Creatinine, Ser 7.82 (*) 0.50 - 1.35 mg/dL   Glucose, Bld 956 (*) 70 - 99 mg/dL   Calcium, Ion 2.13  0.86 - 1.30 mmol/L   TCO2 24  0 - 100 mmol/L   Hemoglobin 15.0  13.0 - 17.0 g/dL   HCT 57.8  46.9 - 62.9 %  POCT I-STAT TROPONIN I      Result Value Range   Troponin i, poc 0.01  0.00 - 0.08 ng/mL   Comment 3            No results found.  11:45 AM he is resting comfortably. Asymptomatic  MDM  No diagnosis found. Flomax may be a culprit as a cause of syncope. Spoke with Dr.Lama plan 23 hour observation telemetry Diagnosis #1syncope #2 renal insufficiency    Doug Sou, MD 04/23/13 1149

## 2013-04-23 NOTE — ED Notes (Signed)
Pt arrived from church and was helping cook when bystanders found pt slumped over on sink while standing. They put pt in chair and got pt awake. When they put pt in chair pt was diaphoretic. Pt stated that he started a new medication that makes him pee a lot. Pt currently A&Ox4. Denies any pain. BP-148/72 HR-75 CBG-162 O2sat-98%ra.

## 2013-04-24 DIAGNOSIS — I517 Cardiomegaly: Secondary | ICD-10-CM

## 2013-04-24 LAB — COMPREHENSIVE METABOLIC PANEL
ALT: 24 U/L (ref 0–53)
AST: 17 U/L (ref 0–37)
Albumin: 3.6 g/dL (ref 3.5–5.2)
Calcium: 8.9 mg/dL (ref 8.4–10.5)
Chloride: 102 mEq/L (ref 96–112)
GFR calc Af Amer: 73 mL/min — ABNORMAL LOW (ref >90)
Glucose, Bld: 126 mg/dL — ABNORMAL HIGH (ref 70–99)
Sodium: 139 mEq/L (ref 135–145)
Total Bilirubin: 0.3 mg/dL (ref 0.3–1.2)
Total Protein: 6.6 g/dL (ref 6.0–8.3)

## 2013-04-24 LAB — CBC
Hemoglobin: 13.2 g/dL (ref 13.0–17.0)
MCH: 30.8 pg (ref 26.0–34.0)
MCHC: 33.3 g/dL (ref 30.0–36.0)
Platelets: 229 10*3/uL (ref 150–400)
RDW: 13.6 % (ref 11.5–15.5)

## 2013-04-24 NOTE — Discharge Summary (Signed)
Physician Discharge Summary  Patient ID: Adrian Ellis MRN: [id number] DOB/AGE: 73/07/1940 72 y.o.  Admit date: 04/23/2013 Discharge date: 04/24/2013  Primary Care Physician:  Evlyn Courier, MD  Discharge Diagnoses:    . Syncope . Prostate enlargement Hypertension  Consults: None   Recommendations for Outpatient Follow-up:  If patient continues to have any further symptoms outpatient, Flomax can be discontinued and assess for improvement in his symptoms  Allergies:  No Known Allergies   Discharge Medications:   Medication List         amlodipine-olmesartan 10-20 MG per tablet  Commonly known as:  AZOR  Take 1 tablet by mouth daily.     aspirin EC 81 MG tablet  Take 81 mg by mouth daily.     dutasteride 0.5 MG capsule  Commonly known as:  AVODART  Take 0.5 mg by mouth daily.     levothyroxine 112 MCG tablet  Commonly known as:  SYNTHROID, LEVOTHROID  Take 112 mcg by mouth daily.     multivitamin with minerals Tabs tablet  Take 1 tablet by mouth daily.     tamsulosin 0.4 MG Caps capsule  Commonly known as:  FLOMAX  Take 0.4 mg by mouth daily.         Brief H and P: For complete details please refer to admission H and P, but in brief72 year old male with history of hypertension him a BPH who was brought by EMS who passed out at the church while he was sitting in the chair. Patient had become lightheaded immediately prior to passing out and lost consciousness for brief time. He denies seizure like activity. Does not have any cardiac history denies palpitations. He denied chest pain or shortness of breath. Patient also denied nausea vomiting or diarrhea. Patient has a history of BPH and was started on Flomax yesterday by his urologist, he took first dose of Flomax at night before admission.   Hospital Course:     Syncope possibly vasovagal versus orthostasis secondary to Flomax..  patient was admitted to telemetry floor, ruled out for acute ACS. Cardiac enzymes  remain negative, EKG showed no acute ST-T wave changes suggestive of  ischemia. Patient may have had postural hypotension as he was just started on Flomax the night before. He is already on Avodart, at this point will continue Flomax and follow up outpatient with urology if he continues to have any symptoms. Patient currently feels at baseline. 2-D echo was done which showed EF of 55-60%, normal wall motion, no aortic stenosis or regurgitation.     Prostate enlargement: For now continue Flomax and Avodart, followup with outpatient urology     Hypertension - Currently stable   Day of Discharge BP 138/60  Pulse 64  Temp(Src) 98.1 F (36.7 C) (Oral)  Resp 18  Ht 6' (1.829 m)  Wt 95.573 kg (210 lb 11.2 oz)  BMI 28.57 kg/m2  SpO2 97%  Physical Exam: General: Alert and awake oriented x3 not in any acute distress. CVS: S1-S2 clear no murmur rubs or gallops Chest: clear to auscultation bilaterally, no wheezing rales or rhonchi Abdomen: soft nontender, nondistended, normal bowel sounds Extremities: no cyanosis, clubbing or edema noted bilaterally Neuro: Cranial nerves II-XII intact, no focal neurological deficits   The results of significant diagnostics from this hospitalization (including imaging, microbiology, ancillary and laboratory) are listed below for reference.    LAB RESULTS: Basic Metabolic Panel:  Recent Labs Lab 04/23/13 1023 04/23/13 1523 04/24/13 0130  NA 139  --  139  K 4.6  --  3.9  CL 101  --  102  CO2  --   --  27  GLUCOSE 124*  --  126*  BUN 17  --  15  CREATININE 1.40* 1.14 1.13  CALCIUM  --   --  8.9   Liver Function Tests:  Recent Labs Lab 04/24/13 0130  AST 17  ALT 24  ALKPHOS 64  BILITOT 0.3  PROT 6.6  ALBUMIN 3.6   No results found for this basename: LIPASE, AMYLASE,  in the last 168 hours No results found for this basename: AMMONIA,  in the last 168 hours CBC:  Recent Labs Lab 04/23/13 1010  04/23/13 1523 04/24/13 0130  WBC 10.3  --   10.0 9.2  NEUTROABS 8.2*  --   --   --   HGB 13.7  < > 13.4 13.2  HCT 40.0  < > 39.6 39.6  MCV 92.0  --  91.9 92.3  PLT 216  --  217 229  < > = values in this interval not displayed. Cardiac Enzymes:  Recent Labs Lab 04/23/13 1938 04/24/13 0130  TROPONINI <0.30 <0.30   BNP: No components found with this basename: POCBNP,  CBG:  Recent Labs Lab 04/23/13 0908  GLUCAP 136*    Significant Diagnostic Studies:  No results found.  2D ECHO: Study Conclusions  Left ventricle: The cavity size was normal. Wall thickness was increased in a pattern of mild LVH. Systolic function was normal. The estimated ejection fraction was in the range of 55% to 60%. Wall motion was normal; there were no regional wall motion abnormalities. There was an increased relative contribution of atrial contraction to ventricular filling.    Disposition and Follow-up: Discharge Orders   Future Orders Complete By Expires   Diet - low sodium heart healthy  As directed    Increase activity slowly  As directed        DISPOSITION: Home  DIET:Heart healthy diet    DISCHARGE FOLLOW-UP Follow-up Information   Follow up with HILL,GERALD K, MD. Schedule an appointment as soon as possible for a visit in 10 days. (For hospital followup)    Specialty:  Family Medicine   Contact information:   [address] [place] [address] [phone]       Time spent on Discharge: 35 mins  Signed:   RAI,RIPUDEEP M.D. [facility] 04/24/2013, 11:32 AM Pager: [phone]

## 2013-04-24 NOTE — Progress Notes (Signed)
Pt provided with dc instructions and edcuation. Pt verbalized understanding. Pt has no needs at this time. VI removed with tip intact. Heart monitor cleaned and returned to front. Levonne Spiller, RN

## 2013-04-24 NOTE — Progress Notes (Signed)
Utilization Review Completed.Adrian Ellis T12/11/2012  

## 2013-04-24 NOTE — Progress Notes (Signed)
  Echocardiogram 2D Echocardiogram has been performed.  Adrian Ellis 04/24/2013, 9:55 AM

## 2014-02-23 DIAGNOSIS — C61 Malignant neoplasm of prostate: Secondary | ICD-10-CM

## 2014-02-23 HISTORY — PX: PROSTATE BIOPSY: SHX241

## 2014-02-23 HISTORY — DX: Malignant neoplasm of prostate: C61

## 2014-03-08 ENCOUNTER — Encounter: Payer: Self-pay | Admitting: Radiation Oncology

## 2014-03-08 NOTE — Progress Notes (Signed)
GU Location of Tumor / Histology: prostate adenocarcinoma  If Prostate Cancer, Gleason Score is (3 + 4) and PSA is (6.89 on 01/26/14) 2013 PSA 3.1  Adrian Ellis presented 1 month ago with signs/symptoms of: increasing PSA, urinary urgency, nocturia, weak urinary stream  Biopsies of prostate revealed:  02/23/14  Volume 25.98 cc   Past/Anticipated interventions by urology, if any: surveillance, biopsy  Past/Anticipated interventions by medical oncology, if any: no  Weight changes, if any: no  Bowel/Bladder complaints, if any:  IPSS 22, incomplete emptying, intermittency, urgency, weak stream, straining, nocturia x 3  Nausea/Vomiting, if any: no  Pain issues, if any:  no  SAFETY ISSUES:  Prior radiation? no  Pacemaker/ICD? no  Possible current pregnancy? na  Is the patient on methotrexate? no  Current Complaints / other details:  Widowed, works part time as Sports coach at [employer], lives alone, niece with him today Dr Janice Norrie does not think he is candidate for active surveillance or radical prostatectomy.

## 2014-03-09 ENCOUNTER — Ambulatory Visit
Admission: RE | Admit: 2014-03-09 | Discharge: 2014-03-09 | Disposition: A | Discharge status: Home / Self Care | Payer: Medicare Other | Source: Ambulatory Visit | Attending: Radiation Oncology | Admitting: Radiation Oncology

## 2014-03-09 ENCOUNTER — Encounter: Payer: Self-pay | Admitting: Radiation Oncology

## 2014-03-09 VITALS — BP 152/62 | HR 68 | Temp 97.6°F | Resp 20 | Ht 72.0 in | Wt 218.0 lb

## 2014-03-09 DIAGNOSIS — N529 Male erectile dysfunction, unspecified: Secondary | ICD-10-CM | POA: Insufficient documentation

## 2014-03-09 DIAGNOSIS — I10 Essential (primary) hypertension: Secondary | ICD-10-CM | POA: Insufficient documentation

## 2014-03-09 DIAGNOSIS — Z7982 Long term (current) use of aspirin: Secondary | ICD-10-CM | POA: Insufficient documentation

## 2014-03-09 DIAGNOSIS — E079 Disorder of thyroid, unspecified: Secondary | ICD-10-CM | POA: Insufficient documentation

## 2014-03-09 DIAGNOSIS — C61 Malignant neoplasm of prostate: Secondary | ICD-10-CM | POA: Diagnosis present

## 2014-03-09 DIAGNOSIS — Z87891 Personal history of nicotine dependence: Secondary | ICD-10-CM | POA: Diagnosis not present

## 2014-03-09 DIAGNOSIS — Z791 Long term (current) use of non-steroidal anti-inflammatories (NSAID): Secondary | ICD-10-CM | POA: Diagnosis not present

## 2014-03-09 HISTORY — DX: Other microscopic hematuria: R31.29

## 2014-03-09 HISTORY — DX: Unspecified symptoms and signs involving the genitourinary system: R39.9

## 2014-03-09 HISTORY — DX: Malignant neoplasm of prostate: C61

## 2014-03-09 NOTE — Progress Notes (Signed)
Please see the Nurse Progress Note in the MD Initial Consult Encounter for this patient. 

## 2014-03-09 NOTE — Progress Notes (Signed)
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 male who is seen today through the courtesy of Dr. Janice Norrie for consideration of radiation therapy in the management of his stageT1c intermediate to high-risk adenocarcinoma prostate. He presented with a PSA of 6.89. Dr. Janice Norrie performed ultrasound-guided biopsies on 02/23/2014. He was found to have Gleason 7 (3+4) in 7 of 12 biopsies. He had 5% involvement of one core from right lateral mid gland, 90% of one core from right lateral apex, 30% of one core from the right apex, 40% of one core from the left lateral base, 5% of one core from the left base, 5% of one core from left lateral apex and 10% of one core from left apex. His gland volume was approximately 26 cc. He does have moderate to severe obstructive symptomatology with an I PSS score of 22. He is been on Avodart continuously since 2008. He is on tamsulosin, but takes this intermittently. He has not taken tamsulosin for the past one to 2 weeks. He has erectile dysfunction. No GI difficulties. He seen today with his niece.   PREVIOUS RADIATION THERAPY: No   PAST MEDICAL HISTORY:  has a past medical history of Hypertension; Thyroid disease; Prostate enlargement; Sinus problem; Prostate cancer (02/23/14); Microhematuria; and Lower urinary tract symptoms (LUTS).     PAST SURGICAL HISTORY:  Past Surgical History  Procedure Laterality Date  . Tonsillectomy      age 35  . Prostate biopsy  02/23/14    Gleason 7, volume 25.98 cc     FAMILY HISTORY: family history includes Aneurysm in his mother; Cancer in his brother. Is father died from old age at 93 and his  mother died from a brain aneurysm and 16.   SOCIAL HISTORY:  reports that he has quit smoking. He does not have any smokeless tobacco history on file. He reports that he does not drink alcohol or use illicit drugs. widower for the past 12 years, no children. He worked as a Glass blower/designer for [employer]: Review of patient's allergies indicates no known allergies.   MEDICATIONS:  Current Outpatient Prescriptions  Medication Sig Dispense Refill  . amlodipine-olmesartan (AZOR) 10-20 MG per tablet Take 1 tablet by mouth daily.      Marland Kitchen aspirin EC 81 MG tablet Take 81 mg by mouth daily.      Marland Kitchen dutasteride (AVODART) 0.5 MG capsule Take 0.5 mg by mouth daily.      Marland Kitchen levothyroxine (SYNTHROID, LEVOTHROID) 112 MCG tablet Take 112 mcg by mouth daily.      . Multiple Vitamin (MULTIVITAMIN WITH MINERALS) TABS Take 1 tablet by mouth daily.      . naproxen sodium (ANAPROX) 220 MG tablet Take 220 mg by mouth 2 (two) times daily with a meal.      . tamsulosin (FLOMAX) 0.4 MG CAPS capsule Take 0.4 mg by mouth daily.       No current facility-administered medications for this encounter.     REVIEW OF SYSTEMS:  Pertinent items are noted in HPI.    PHYSICAL EXAM:  height is  6' (1.829 m) and weight is 218 lb (98.884 kg). His oral temperature is 97.6 F (36.4 C). His blood pressure is 152/62 and his pulse is 68. His respiration is 20.   Alert and oriented 73 year old African American male appearing his stated age. Head and neck examination: Grossly unremarkable. Nodes: Without palpable cervical or supraclavicular lymphadenopathy. Chest: Lungs clear. Back: Without spinal or CVA tenderness. Abdomen: Without masses organomegaly. Genitalia: Unremarkable to inspection. Rectal: The prostate gland is normal in size and is without focal induration or nodularity. Extremities: Trace ankle edema.   LABORATORY DATA:  Lab Results  Component Value Date   WBC 9.2 04/24/2013   HGB 13.2 04/24/2013    HCT 39.6 04/24/2013   MCV 92.3 04/24/2013   PLT 229 04/24/2013   Lab Results  Component Value Date   NA 139 04/24/2013   K 3.9 04/24/2013   CL 102 04/24/2013   CO2 27 04/24/2013   Lab Results  Component Value Date   ALT 24 04/24/2013   AST 17 04/24/2013   ALKPHOS 64 04/24/2013   BILITOT 0.3 04/24/2013   PSA 6.89 from 01/26/2014 (corrected 13.8)   IMPRESSION: Stage T1c intermediate to high-risk adenocarcinoma prostate. I explained to the patient and his niece that his prognosis is related to his stage, PSA level, and Gleason score. His stage is favorable. His corrected PSA is 13.8 which is of intermediate favorability and his Gleason score of 7 is also of intermediate favorability. Combined, he can be considered as having intermediate to high-risk disease. Management options include surgery versus close surveillance versus radiation therapy. Radiation therapy options include 5 weeks of external beam followed by seed implantation or 8 weeks of external beam/IMRT. He is not a candidate for seed implantation boost in view of his severe obstructive symptomatology. By default his best option is external beam/IMRT. I do recommend androgen deprivation therapy for his intermediate to high-risk disease. Although 2 years is felt to be the standard duration of androgen deprivation therapy, he can probably do well with 9 months to one year. We discussed the potential acute and late toxicities of radiation therapy and also androgen deprivation therapy. We discussed comfortable bladder filling to reduce urinary toxicity. I spoke with Dr. Janice Norrie to get him set up for his androgen deprivation therapy and also complete his staging workup with a CT scan of his abdomen/pelvis and bone scan. We will schedule him for placement of 3 gold seed markers through Dr. Janice Norrie within the next 2 months and then have him return to see me for a followup visit in 2 months. All of his questions were answered. PLAN: As discussed above.  I spent  60 minutes minutes face to face with the patient and more than 50% of that time was spent in counseling and/or coordination of care.

## 2014-03-10 ENCOUNTER — Other Ambulatory Visit (HOSPITAL_COMMUNITY): Payer: Self-pay | Admitting: Urology

## 2014-03-10 DIAGNOSIS — C61 Malignant neoplasm of prostate: Secondary | ICD-10-CM

## 2014-03-21 ENCOUNTER — Telehealth: Payer: Self-pay | Admitting: *Deleted

## 2014-03-21 ENCOUNTER — Encounter: Payer: Self-pay | Admitting: Radiation Oncology

## 2014-03-21 ENCOUNTER — Encounter (HOSPITAL_COMMUNITY)
Admission: RE | Admit: 2014-03-21 | Discharge: 2014-03-21 | Disposition: A | Discharge status: Home / Self Care | Payer: Medicare Other | Source: Ambulatory Visit | Attending: Urology | Admitting: Urology

## 2014-03-21 ENCOUNTER — Encounter (HOSPITAL_COMMUNITY): Payer: Medicare Other

## 2014-03-21 ENCOUNTER — Other Ambulatory Visit (HOSPITAL_COMMUNITY): Payer: Self-pay | Admitting: Urology

## 2014-03-21 DIAGNOSIS — C61 Malignant neoplasm of prostate: Secondary | ICD-10-CM | POA: Diagnosis not present

## 2014-03-21 MED ORDER — TECHNETIUM TC 99M MEDRONATE IV KIT
26.0000 | PACK | Freq: Once | INTRAVENOUS | Status: AC | PRN
  Administered 2014-03-21: 26 via INTRAVENOUS

## 2014-03-21 NOTE — Telephone Encounter (Signed)
CALLED PATIENT TO INFORM OF GOLD SEED PLACEMENT ON 04-28-14 - ARRIVAL TIME - 3:15 PM @ DR. Janice Norrie' OFFICE AND HIS [place] VISIT WITH DR. Valere Dross ON 05-02-14, LVM FOR A RETURN CALL

## 2014-03-22 ENCOUNTER — Encounter: Payer: Self-pay | Admitting: Radiation Oncology

## 2014-03-22 NOTE — Progress Notes (Signed)
Mailed [facility] letter and other paperwork requested by Dr. Valere Dross on 03/22/14

## 2014-05-01 NOTE — Progress Notes (Addendum)
GU Location of Tumor / Histology: prostate adenocarcinoma  If Prostate Cancer, Gleason Score is (3 + 4) and PSA is (6.89 on 01/26/14) 2013 PSA 3.1  Martina Sinner presented 1 month ago with signs/symptoms of: increasing PSA, urinary urgency, nocturia, weak urinary stream  Biopsies of prostate revealed:  02/23/14 Volume 25.98 cc   Past/Anticipated interventions by urology, if any: surveillance, biopsy  Past/Anticipated interventions by medical oncology, if any: no  Weight changes, if any: no  Bowel/Bladder complaints, if any: IPSS 10, nocturia x 3-4, intermittent stream, urgency Nausea/Vomiting, if any: no  Pain issues, if any: no  SAFETY ISSUES:  Prior radiation? no  Pacemaker/ICD? no  Possible current pregnancy? na  Is the patient on methotrexate? no  Current Complaints / other details: Widowed, works part time as Sports coach at [employer], lives alone Dr Janice Norrie does not think he is candidate for active surveillance or radical prostatectomy.  04/18/14 Received Mills Koller 03/14/14, received Lupron injection 30 mg, next dose in 4 mos per Dr Sammie Bench RN 04/28/14 placement of 3 gold seed fiducial markers, Dr Janice Norrie  Patient states ever since his seed marker placement he has had dark stools. He denies frank bleeding/blood and history of dark stools, last colonoscopy in 2003.

## 2014-05-02 ENCOUNTER — Ambulatory Visit
Admission: RE | Admit: 2014-05-02 | Discharge: 2014-05-02 | Disposition: A | Discharge status: Home / Self Care | Payer: Medicare Other | Source: Ambulatory Visit | Attending: Radiation Oncology | Admitting: Radiation Oncology

## 2014-05-02 ENCOUNTER — Telehealth: Payer: Self-pay | Admitting: *Deleted

## 2014-05-02 ENCOUNTER — Encounter: Payer: Self-pay | Admitting: Radiation Oncology

## 2014-05-02 VITALS — BP 150/65 | HR 70 | Temp 97.6°F | Resp 20 | Wt 220.2 lb

## 2014-05-02 DIAGNOSIS — C61 Malignant neoplasm of prostate: Secondary | ICD-10-CM

## 2014-05-02 NOTE — Progress Notes (Signed)
CC: Dr. Lowella Bandy, Dr. Iona Beard  Follow-up note:  Diagnosis: Stage TIc intermediate to high risk adenocarcinoma prostate  History: Mr. Cabello returns today for review and scheduling of his radiation therapy in the management of his stage TIc intermediate to high risk adenocarcinoma prostate.  He presented with a PSA of 6.89. Dr. Janice Norrie performed ultrasound-guided biopsies on 02/23/2014. He was found to have Gleason 7 (3+4) in 7 of 12 biopsies. He had 5% involvement of one core from right lateral mid gland, 90% of one core from right lateral apex, 30% of one core from the right apex, 40% of one core from the left lateral base, 5% of one core from the left base, 5% of one core from left lateral apex and 10% of one core from left apex. His gland volume was approximately 26 cc. He has moderate to severe obstructive symptomatology with a presenting I PSS score of 22.  He elected for androgen deprivation therapy for one to 2 years with external beam/IMRT.  He was started on androgen deprivation therapy with Firmagon on 03/14/2014.  Dr. Janice Norrie was kind enough to place gold markers for image guidance on December 11.  After placement of his gold markers, he tells me that he needs to sit down when urinating because of a sense of rectal urgency.  He tells me that he has been having dark stools for the past month which predated placement of his gold seed markers.  He tells me that his last colonoscopy was in 2003.  He does admit to hot flashes as expected.  He tells me he will see Dr. Berdine Addison for a follow-up visit next week.  Physical examination: Alert and oriented. Filed Vitals:   05/02/14 0832  BP: 150/65  Pulse: 70  Temp: 97.6 F (36.4 C)  Resp: 20   Rectal examination: The prostate gland is normal in size and is without focal induration or nodularity.  Impression: Stage TIc intermediate to high risk adenocarcinoma prostate.  I reviewed with Mr. Veiga once again the potential acute and late toxicities of  radiation therapy.  We also discussed bladder filling to minimize urinary toxicity.  Consent is signed today.  We will have him return for CT simulation/treatment planning in mid January.  Knowing that he has difficulty reading, I made diagrams to explain his radiation therapy, treatment planning process, and bladder filling instructions.  I told Mr. Belue up with copy my note to Dr. Berdine Addison to see if he needs to undergo GI evaluation if he indeed has GI bleeding.  The start of radiation therapy can certainly be delayed.  Plan: Visit with Dr. Berdine Addison next week, and if everything is satisfactory, Mr. Tigert to return here for CT simulation in mid January.  30 minutes was spent face-to-face with the patient, primarily counseling patient and coordinating his care.

## 2014-05-02 NOTE — Telephone Encounter (Signed)
Returned vm from patient. He questioned if this nurse called him. This RN assured pt she did not call him. Patient had recently called, spoke with Verdell Carmine, medical secretary and was transferred to ct sim to speak with Alm Bustard, RT. He states [place] said "she was busy and would call him back". Assured Mr Longton that Creed Copper will call him back. He verbalized understanding.

## 2014-05-22 DIAGNOSIS — K921 Melena: Secondary | ICD-10-CM | POA: Diagnosis not present

## 2014-05-29 ENCOUNTER — Ambulatory Visit
Admission: RE | Admit: 2014-05-29 | Discharge: 2014-05-29 | Disposition: A | Discharge status: Home / Self Care | Payer: Medicare Other | Source: Ambulatory Visit | Attending: Radiation Oncology | Admitting: Radiation Oncology

## 2014-05-29 DIAGNOSIS — C61 Malignant neoplasm of prostate: Secondary | ICD-10-CM | POA: Insufficient documentation

## 2014-05-29 DIAGNOSIS — Z510 Encounter for antineoplastic radiation therapy: Secondary | ICD-10-CM | POA: Insufficient documentation

## 2014-05-29 NOTE — Progress Notes (Signed)
Complex simulation/treatment planning note: The patient was taken to the CT simulator.  A Vac lock immobilization device was constructed.  A red rubber tube was placed within the rectal vault.  He was then catheterized and contrast instilled into the bladder/urethra.  The CT data set was sent to the  MIM system where I contoured his prostate, proximal seminal vesicles, bladder, rectum, and rectosigmoid colon.  I'm prescribing 7800 cGy in 40 sessions to his prostate PTV which represents the prostate +0.8 cm except for 0.5 cm along the rectum.  I prescribing 5600 cGy in 40 sessions to his seminal vesicle PTV which represents the seminal vesicles +0.5 cm.  He is now ready for IMRT simulation/treatment planning.

## 2014-05-30 ENCOUNTER — Encounter: Payer: Self-pay | Admitting: Radiation Oncology

## 2014-05-30 DIAGNOSIS — C61 Malignant neoplasm of prostate: Secondary | ICD-10-CM | POA: Diagnosis not present

## 2014-05-30 DIAGNOSIS — Z510 Encounter for antineoplastic radiation therapy: Secondary | ICD-10-CM | POA: Diagnosis not present

## 2014-05-30 NOTE — Progress Notes (Signed)
MRT simulation/treatment planning note: The patient completed IMRT simulation/treatment planning in the management of his carcinoma the prostate.  IMRT was chosen to decrease the risk for both acute and late bladder and rectal toxicity compared to 3-D conformal or conventional radiation therapy.  Dose volume histograms were obtained for the target structures including the prostate PTV which represents the prostate +0.8 cm except for 0.5 cm along the rectum.  We met our departmental goals.  Dose volume histograms were also obtained for the avoidance structures including the bladder, rectum, and femoral heads.  We also met our departmental goals.  I prescribing 7800 cGy in 40 sessions to his prostate PTV and 5600 cGy in 40 sessions to his seminal vesicle PTV which represents the seminal vesicles +0.5 cm.

## 2014-05-31 DIAGNOSIS — C61 Malignant neoplasm of prostate: Secondary | ICD-10-CM | POA: Diagnosis not present

## 2014-05-31 DIAGNOSIS — Z510 Encounter for antineoplastic radiation therapy: Secondary | ICD-10-CM | POA: Diagnosis not present

## 2014-06-07 ENCOUNTER — Ambulatory Visit
Admission: RE | Admit: 2014-06-07 | Discharge: 2014-06-07 | Disposition: A | Discharge status: Home / Self Care | Payer: Medicare Other | Source: Ambulatory Visit | Attending: Radiation Oncology | Admitting: Radiation Oncology

## 2014-06-07 DIAGNOSIS — C61 Malignant neoplasm of prostate: Secondary | ICD-10-CM

## 2014-06-07 DIAGNOSIS — Z510 Encounter for antineoplastic radiation therapy: Secondary | ICD-10-CM | POA: Diagnosis not present

## 2014-06-07 NOTE — Progress Notes (Signed)
Chart note: The patient began his VMAT IMRT today in the management of his carcinoma the prostate.  He is being treated with dual ARC VMAT with 2 dynamic sets of MLCs corresponding to one IMRT treatment devices (90903).

## 2014-06-08 ENCOUNTER — Ambulatory Visit
Admission: RE | Admit: 2014-06-08 | Discharge: 2014-06-08 | Disposition: A | Discharge status: Home / Self Care | Payer: Medicare Other | Source: Ambulatory Visit | Attending: Radiation Oncology | Admitting: Radiation Oncology

## 2014-06-08 DIAGNOSIS — C61 Malignant neoplasm of prostate: Secondary | ICD-10-CM | POA: Diagnosis not present

## 2014-06-08 DIAGNOSIS — Z510 Encounter for antineoplastic radiation therapy: Secondary | ICD-10-CM | POA: Diagnosis not present

## 2014-06-09 ENCOUNTER — Ambulatory Visit: Payer: Medicare Other

## 2014-06-12 ENCOUNTER — Encounter: Payer: Self-pay | Admitting: Radiation Oncology

## 2014-06-12 ENCOUNTER — Ambulatory Visit
Admission: RE | Admit: 2014-06-12 | Discharge: 2014-06-12 | Disposition: A | Discharge status: Home / Self Care | Payer: Medicare Other | Source: Ambulatory Visit | Attending: Radiation Oncology | Admitting: Radiation Oncology

## 2014-06-12 VITALS — BP 132/62 | HR 73 | Temp 97.5°F | Resp 12 | Wt 224.0 lb

## 2014-06-12 DIAGNOSIS — C61 Malignant neoplasm of prostate: Secondary | ICD-10-CM | POA: Diagnosis not present

## 2014-06-12 DIAGNOSIS — Z510 Encounter for antineoplastic radiation therapy: Secondary | ICD-10-CM | POA: Diagnosis not present

## 2014-06-12 NOTE — Progress Notes (Signed)
He is currently in no pain. Pt complains of loss of sleep Pt reports urinary frequency, urgency and hot flashes. Pt states they urinate 2 - 3 times per night.  Pt reports Diarrhea over the weekend. BP 132/62 mmHg  Pulse 73  Temp(Src) 97.5 F (36.4 C) (Oral)  Resp 12  Wt 224 lb (101.606 kg)  SpO2 100%

## 2014-06-12 NOTE — Progress Notes (Signed)
Weekly Management Note:  Site: Prostate Current Dose:  585  cGy Projected Dose: 7800  cGy  Narrative: The patient is seen today for routine under treatment assessment. CBCT/MVCT images/port films were reviewed. The chart was reviewed.   Bladder filling is satisfactory.  No new GU or GI difficulties.  Physical Examination:  Filed Vitals:   06/12/14 1501  BP: 132/62  Pulse: 73  Temp: 97.5 F (36.4 C)  Resp: 12  .  Weight: 224 lb (101.606 kg).  No change.  Impression: Tolerating radiation therapy well.  Plan: Continue radiation therapy as planned.

## 2014-06-13 ENCOUNTER — Ambulatory Visit
Admission: RE | Admit: 2014-06-13 | Discharge: 2014-06-13 | Disposition: A | Discharge status: Home / Self Care | Payer: Medicare Other | Source: Ambulatory Visit | Attending: Radiation Oncology | Admitting: Radiation Oncology

## 2014-06-13 DIAGNOSIS — C61 Malignant neoplasm of prostate: Secondary | ICD-10-CM | POA: Diagnosis not present

## 2014-06-13 DIAGNOSIS — Z510 Encounter for antineoplastic radiation therapy: Secondary | ICD-10-CM | POA: Diagnosis not present

## 2014-06-14 ENCOUNTER — Ambulatory Visit
Admission: RE | Admit: 2014-06-14 | Discharge: 2014-06-14 | Disposition: A | Discharge status: Home / Self Care | Payer: Medicare Other | Source: Ambulatory Visit | Attending: Radiation Oncology | Admitting: Radiation Oncology

## 2014-06-14 DIAGNOSIS — Z510 Encounter for antineoplastic radiation therapy: Secondary | ICD-10-CM | POA: Diagnosis not present

## 2014-06-14 DIAGNOSIS — C61 Malignant neoplasm of prostate: Secondary | ICD-10-CM | POA: Diagnosis not present

## 2014-06-15 ENCOUNTER — Ambulatory Visit
Admission: RE | Admit: 2014-06-15 | Discharge: 2014-06-15 | Disposition: A | Discharge status: Home / Self Care | Payer: Medicare Other | Source: Ambulatory Visit | Attending: Radiation Oncology | Admitting: Radiation Oncology

## 2014-06-15 DIAGNOSIS — Z510 Encounter for antineoplastic radiation therapy: Secondary | ICD-10-CM | POA: Diagnosis not present

## 2014-06-15 DIAGNOSIS — C61 Malignant neoplasm of prostate: Secondary | ICD-10-CM | POA: Diagnosis not present

## 2014-06-16 ENCOUNTER — Ambulatory Visit
Admission: RE | Admit: 2014-06-16 | Discharge: 2014-06-16 | Disposition: A | Discharge status: Home / Self Care | Payer: Medicare Other | Source: Ambulatory Visit | Attending: Radiation Oncology | Admitting: Radiation Oncology

## 2014-06-16 DIAGNOSIS — Z510 Encounter for antineoplastic radiation therapy: Secondary | ICD-10-CM | POA: Diagnosis not present

## 2014-06-16 DIAGNOSIS — C61 Malignant neoplasm of prostate: Secondary | ICD-10-CM | POA: Diagnosis not present

## 2014-06-19 ENCOUNTER — Encounter: Payer: Self-pay | Admitting: Radiation Oncology

## 2014-06-19 ENCOUNTER — Ambulatory Visit
Admission: RE | Admit: 2014-06-19 | Discharge: 2014-06-19 | Disposition: A | Discharge status: Home / Self Care | Payer: Medicare Other | Source: Ambulatory Visit | Attending: Radiation Oncology | Admitting: Radiation Oncology

## 2014-06-19 VITALS — BP 143/67 | HR 73 | Temp 97.7°F | Resp 12 | Wt 227.7 lb

## 2014-06-19 DIAGNOSIS — Z510 Encounter for antineoplastic radiation therapy: Secondary | ICD-10-CM | POA: Diagnosis not present

## 2014-06-19 DIAGNOSIS — C61 Malignant neoplasm of prostate: Secondary | ICD-10-CM | POA: Diagnosis not present

## 2014-06-19 NOTE — Progress Notes (Signed)
Weekly Management Note:  Site: Prostate Current Dose:  1560  cGy Projected Dose: 7800  cGy  Narrative: The patient is seen today for routine under treatment assessment. CBCT/MVCT images/port films were reviewed. The chart was reviewed.   Bladder filling is excellent.  No new GU or GI difficulties.  He tells that he does have nocturia 3-4 which is not unusual.  He is on tamsulosin.  He decreased to my attention a localized erythematous rash along his right calf.  This is not pruritic.  The rash does not extend up his leg.  Physical Examination:  Filed Vitals:   06/19/14 1553  BP: 143/67  Pulse: 73  Temp: 97.7 F (36.5 C)  Resp: 12  .  Weight: 227 lb 11.2 oz (103.284 kg).  No change.  There is a 4-5 cm erythematous rash along his right calf.  I'm not sure what this is.  There is no dermatome distribution, and it does not appear to be shingles.  Impression: Tolerating radiation therapy well.  He will see Dr. Berdine Addison later this week for evaluation of his rash at the time of a routine office follow-up visit.  Plan: Continue radiation therapy as planned.

## 2014-06-19 NOTE — Progress Notes (Signed)
He is currently in no pain.  Pt complains of fatigue Pt reports urinary frequency, urgency and hot flashes. Pt states they urinate 3 - 4 times per night.  Pt reports soft bowel movement everyday BP 143/67 mmHg  Pulse 73  Temp(Src) 97.7 F (36.5 C) (Oral)  Resp 12  Wt 227 lb 11.2 oz (103.284 kg)  SpO2 100%

## 2014-06-20 ENCOUNTER — Ambulatory Visit
Admission: RE | Admit: 2014-06-20 | Discharge: 2014-06-20 | Disposition: A | Discharge status: Home / Self Care | Payer: Medicare Other | Source: Ambulatory Visit | Attending: Radiation Oncology | Admitting: Radiation Oncology

## 2014-06-20 DIAGNOSIS — C61 Malignant neoplasm of prostate: Secondary | ICD-10-CM | POA: Diagnosis not present

## 2014-06-20 DIAGNOSIS — Z510 Encounter for antineoplastic radiation therapy: Secondary | ICD-10-CM | POA: Diagnosis not present

## 2014-06-21 ENCOUNTER — Ambulatory Visit
Admission: RE | Admit: 2014-06-21 | Discharge: 2014-06-21 | Disposition: A | Discharge status: Home / Self Care | Payer: Medicare Other | Source: Ambulatory Visit | Attending: Radiation Oncology | Admitting: Radiation Oncology

## 2014-06-21 DIAGNOSIS — C61 Malignant neoplasm of prostate: Secondary | ICD-10-CM | POA: Diagnosis not present

## 2014-06-21 DIAGNOSIS — Z0000 Encounter for general adult medical examination without abnormal findings: Secondary | ICD-10-CM | POA: Diagnosis not present

## 2014-06-21 DIAGNOSIS — Z510 Encounter for antineoplastic radiation therapy: Secondary | ICD-10-CM | POA: Diagnosis not present

## 2014-06-22 ENCOUNTER — Ambulatory Visit
Admission: RE | Admit: 2014-06-22 | Discharge: 2014-06-22 | Disposition: A | Discharge status: Home / Self Care | Payer: Medicare Other | Source: Ambulatory Visit | Attending: Radiation Oncology | Admitting: Radiation Oncology

## 2014-06-22 DIAGNOSIS — C61 Malignant neoplasm of prostate: Secondary | ICD-10-CM | POA: Diagnosis not present

## 2014-06-22 DIAGNOSIS — Z510 Encounter for antineoplastic radiation therapy: Secondary | ICD-10-CM | POA: Diagnosis not present

## 2014-06-23 ENCOUNTER — Ambulatory Visit: Admission: RE | Admit: 2014-06-23 | Payer: Medicare Other | Source: Ambulatory Visit

## 2014-06-23 ENCOUNTER — Ambulatory Visit
Admission: RE | Admit: 2014-06-23 | Discharge: 2014-06-23 | Disposition: A | Discharge status: Home / Self Care | Payer: Medicare Other | Source: Ambulatory Visit | Attending: Radiation Oncology | Admitting: Radiation Oncology

## 2014-06-23 DIAGNOSIS — C61 Malignant neoplasm of prostate: Secondary | ICD-10-CM | POA: Diagnosis not present

## 2014-06-26 ENCOUNTER — Ambulatory Visit
Admission: RE | Admit: 2014-06-26 | Discharge: 2014-06-26 | Disposition: A | Discharge status: Home / Self Care | Payer: Medicare Other | Source: Ambulatory Visit | Attending: Radiation Oncology | Admitting: Radiation Oncology

## 2014-06-26 ENCOUNTER — Encounter: Payer: Self-pay | Admitting: Radiation Oncology

## 2014-06-26 VITALS — BP 144/69 | HR 76 | Temp 98.2°F | Ht 72.0 in | Wt 222.6 lb

## 2014-06-26 DIAGNOSIS — C61 Malignant neoplasm of prostate: Secondary | ICD-10-CM

## 2014-06-26 DIAGNOSIS — Z510 Encounter for antineoplastic radiation therapy: Secondary | ICD-10-CM | POA: Diagnosis not present

## 2014-06-26 NOTE — Progress Notes (Signed)
Weekly Management Note:  Site: Prostate Current Dose:  2535  cGy Projected Dose: 7800  cGy  Narrative: The patient is seen today for routine under treatment assessment. CBCT/MVCT images/port films were reviewed. The chart was reviewed.   Bladder filling is satisfactory.  His most posterior seed along the rectum appears to have migrated somewhat.  No new GU or GI difficulties.  His weight is down 7 pounds over the past week, but his weight last week may have been an error.  We reviewed his diet.  Physical Examination:  Filed Vitals:   06/26/14 1529  BP: 144/69  Pulse: 76  Temp: 98.2 F (36.8 C)  .  Weight: 220 lb 11.2 oz (100.109 kg).  No change.  Impression: Tolerating radiation therapy well.  Plan: Continue radiation therapy as planned.

## 2014-06-26 NOTE — Progress Notes (Signed)
Adrian Ellis has received 13 fractions to his pelvis for prostate cancer. He reports that he voids 3-4 times nightly which is his norm and that , at times, he dribbles and has urgency.  He has lost 7 lbs since 06/19/14 because he is eating mostly vegetables.  Explained the need to increase protein in his diet to decrease weight loss and to prevent fatigue.   Given suggestions on protein rich foods. Family member present.

## 2014-06-27 ENCOUNTER — Ambulatory Visit
Admission: RE | Admit: 2014-06-27 | Discharge: 2014-06-27 | Disposition: A | Discharge status: Home / Self Care | Payer: Medicare Other | Source: Ambulatory Visit | Attending: Radiation Oncology | Admitting: Radiation Oncology

## 2014-06-27 DIAGNOSIS — Z510 Encounter for antineoplastic radiation therapy: Secondary | ICD-10-CM | POA: Diagnosis not present

## 2014-06-27 DIAGNOSIS — C61 Malignant neoplasm of prostate: Secondary | ICD-10-CM | POA: Diagnosis not present

## 2014-06-28 ENCOUNTER — Ambulatory Visit
Admission: RE | Admit: 2014-06-28 | Discharge: 2014-06-28 | Disposition: A | Discharge status: Home / Self Care | Payer: Medicare Other | Source: Ambulatory Visit | Attending: Radiation Oncology | Admitting: Radiation Oncology

## 2014-06-28 DIAGNOSIS — C61 Malignant neoplasm of prostate: Secondary | ICD-10-CM | POA: Diagnosis not present

## 2014-06-28 DIAGNOSIS — Z510 Encounter for antineoplastic radiation therapy: Secondary | ICD-10-CM | POA: Diagnosis not present

## 2014-06-29 ENCOUNTER — Ambulatory Visit
Admission: RE | Admit: 2014-06-29 | Discharge: 2014-06-29 | Disposition: A | Discharge status: Home / Self Care | Payer: Medicare Other | Source: Ambulatory Visit | Attending: Radiation Oncology | Admitting: Radiation Oncology

## 2014-06-29 DIAGNOSIS — Z510 Encounter for antineoplastic radiation therapy: Secondary | ICD-10-CM | POA: Diagnosis not present

## 2014-06-29 DIAGNOSIS — C61 Malignant neoplasm of prostate: Secondary | ICD-10-CM | POA: Diagnosis not present

## 2014-06-29 NOTE — Addendum Note (Signed)
Encounter addended by: Jenene Slicker, RN on: 06/29/2014  3:34 PM<BR>     Documentation filed: Vitals Section

## 2014-06-30 ENCOUNTER — Ambulatory Visit
Admission: RE | Admit: 2014-06-30 | Discharge: 2014-06-30 | Disposition: A | Discharge status: Home / Self Care | Payer: Medicare Other | Source: Ambulatory Visit | Attending: Radiation Oncology | Admitting: Radiation Oncology

## 2014-06-30 DIAGNOSIS — Z510 Encounter for antineoplastic radiation therapy: Secondary | ICD-10-CM | POA: Diagnosis not present

## 2014-06-30 DIAGNOSIS — C61 Malignant neoplasm of prostate: Secondary | ICD-10-CM | POA: Diagnosis not present

## 2014-07-03 ENCOUNTER — Ambulatory Visit
Admission: RE | Admit: 2014-07-03 | Discharge: 2014-07-03 | Disposition: A | Discharge status: Home / Self Care | Payer: Medicare Other | Source: Ambulatory Visit | Attending: Radiation Oncology | Admitting: Radiation Oncology

## 2014-07-03 ENCOUNTER — Encounter: Payer: Self-pay | Admitting: Radiation Oncology

## 2014-07-03 VITALS — BP 137/58 | HR 73 | Temp 98.8°F | Resp 12 | Wt 222.5 lb

## 2014-07-03 DIAGNOSIS — Z510 Encounter for antineoplastic radiation therapy: Secondary | ICD-10-CM | POA: Diagnosis not present

## 2014-07-03 DIAGNOSIS — C61 Malignant neoplasm of prostate: Secondary | ICD-10-CM

## 2014-07-03 NOTE — Progress Notes (Signed)
He is currently in no pain.  Pt complains of fatigue Pt reports urinary frequency, hot flashes and dribbing. Pt states they urinate 2 - 3 times per night.  Pt reports a soft bowel movement everyday/everyother day. BP 137/58 mmHg  Pulse 73  Temp(Src) 98.8 F (37.1 C) (Oral)  Resp 12  Wt 222 lb 8 oz (100.925 kg)  SpO2 100%

## 2014-07-03 NOTE — Progress Notes (Signed)
Weekly Management Note:  Site: Prostate Current Dose:  3315  cGy Projected Dose: 7800  cGy  Narrative: The patient is seen today for routine under treatment assessment. CBCT/MVCT images/port films were reviewed. The chart was reviewed.   Bladder filling is satisfactory.  No new GU or GI difficulties.  He does have nocturia 2-3.  Physical Examination:  Filed Vitals:   07/03/14 1505  BP: 137/58  Pulse: 73  Temp: 98.8 F (37.1 C)  Resp: 12  .  Weight: 222 lb 8 oz (100.925 kg).  No change.  Impression: Tolerating radiation therapy well.  Plan: Continue radiation therapy as planned.

## 2014-07-04 ENCOUNTER — Ambulatory Visit
Admission: RE | Admit: 2014-07-04 | Discharge: 2014-07-04 | Disposition: A | Discharge status: Home / Self Care | Payer: Medicare Other | Source: Ambulatory Visit | Attending: Radiation Oncology | Admitting: Radiation Oncology

## 2014-07-04 DIAGNOSIS — C61 Malignant neoplasm of prostate: Secondary | ICD-10-CM | POA: Diagnosis not present

## 2014-07-04 DIAGNOSIS — Z510 Encounter for antineoplastic radiation therapy: Secondary | ICD-10-CM | POA: Diagnosis not present

## 2014-07-05 ENCOUNTER — Ambulatory Visit
Admission: RE | Admit: 2014-07-05 | Discharge: 2014-07-05 | Disposition: A | Discharge status: Home / Self Care | Payer: Medicare Other | Source: Ambulatory Visit | Attending: Radiation Oncology | Admitting: Radiation Oncology

## 2014-07-05 DIAGNOSIS — Z510 Encounter for antineoplastic radiation therapy: Secondary | ICD-10-CM | POA: Diagnosis not present

## 2014-07-05 DIAGNOSIS — C61 Malignant neoplasm of prostate: Secondary | ICD-10-CM | POA: Diagnosis not present

## 2014-07-06 ENCOUNTER — Ambulatory Visit
Admission: RE | Admit: 2014-07-06 | Discharge: 2014-07-06 | Disposition: A | Discharge status: Home / Self Care | Payer: Medicare Other | Source: Ambulatory Visit | Attending: Radiation Oncology | Admitting: Radiation Oncology

## 2014-07-06 DIAGNOSIS — Z510 Encounter for antineoplastic radiation therapy: Secondary | ICD-10-CM | POA: Diagnosis not present

## 2014-07-06 DIAGNOSIS — C61 Malignant neoplasm of prostate: Secondary | ICD-10-CM | POA: Diagnosis not present

## 2014-07-07 ENCOUNTER — Ambulatory Visit
Admission: RE | Admit: 2014-07-07 | Discharge: 2014-07-07 | Disposition: A | Discharge status: Home / Self Care | Payer: Medicare Other | Source: Ambulatory Visit | Attending: Radiation Oncology | Admitting: Radiation Oncology

## 2014-07-07 DIAGNOSIS — C61 Malignant neoplasm of prostate: Secondary | ICD-10-CM | POA: Diagnosis not present

## 2014-07-07 DIAGNOSIS — Z510 Encounter for antineoplastic radiation therapy: Secondary | ICD-10-CM | POA: Diagnosis not present

## 2014-07-10 ENCOUNTER — Encounter: Payer: Self-pay | Admitting: Radiation Oncology

## 2014-07-10 ENCOUNTER — Ambulatory Visit
Admission: RE | Admit: 2014-07-10 | Discharge: 2014-07-10 | Disposition: A | Discharge status: Home / Self Care | Payer: Medicare Other | Source: Ambulatory Visit | Attending: Radiation Oncology | Admitting: Radiation Oncology

## 2014-07-10 VITALS — BP 125/59 | HR 73 | Temp 98.0°F | Ht 72.0 in | Wt 223.4 lb

## 2014-07-10 DIAGNOSIS — C61 Malignant neoplasm of prostate: Secondary | ICD-10-CM | POA: Diagnosis not present

## 2014-07-10 DIAGNOSIS — Z510 Encounter for antineoplastic radiation therapy: Secondary | ICD-10-CM | POA: Diagnosis not present

## 2014-07-10 NOTE — Progress Notes (Signed)
Weekly Management Note:  Site: Prostate Current Dose:  4290  cGy Projected Dose: 7800  cGy  Narrative: The patient is seen today for routine under treatment assessment. CBCT/MVCT images/port films were reviewed. The chart was reviewed.   Bladder filling is satisfactory, but he still has a fair amount of rectosigmoid gas.  We try to extract some gas on the treatment unit today but we were unsuccessful.  No GI complaints.  His wife tells me that he eats lots of beans.  Physical Examination:  Filed Vitals:   07/10/14 1604  BP: 125/59  Pulse: 73  Temp: 98 F (36.7 C)  .  Weight: 223 lb 6.4 oz (101.334 kg).  No change.  Impression: Tolerating radiation therapy well.  We talked about modifying his diet to control his gas.  I also suggested he take simethicone.  Plan: Continue radiation therapy as planned.

## 2014-07-10 NOTE — Progress Notes (Signed)
Adrian Ellis has received 22 fractions to his pelvis. He states he voids 1-2 times nightly and he reports that he empties completely. He denies any loose or diarrheal stools and no rectal irritation.

## 2014-07-11 ENCOUNTER — Ambulatory Visit
Admission: RE | Admit: 2014-07-11 | Discharge: 2014-07-11 | Disposition: A | Discharge status: Home / Self Care | Payer: Medicare Other | Source: Ambulatory Visit | Attending: Radiation Oncology | Admitting: Radiation Oncology

## 2014-07-11 DIAGNOSIS — C61 Malignant neoplasm of prostate: Secondary | ICD-10-CM | POA: Diagnosis not present

## 2014-07-11 DIAGNOSIS — Z510 Encounter for antineoplastic radiation therapy: Secondary | ICD-10-CM | POA: Diagnosis not present

## 2014-07-12 ENCOUNTER — Ambulatory Visit
Admission: RE | Admit: 2014-07-12 | Discharge: 2014-07-12 | Disposition: A | Discharge status: Home / Self Care | Payer: Medicare Other | Source: Ambulatory Visit | Attending: Radiation Oncology | Admitting: Radiation Oncology

## 2014-07-12 DIAGNOSIS — Z510 Encounter for antineoplastic radiation therapy: Secondary | ICD-10-CM | POA: Diagnosis not present

## 2014-07-12 DIAGNOSIS — C61 Malignant neoplasm of prostate: Secondary | ICD-10-CM | POA: Diagnosis not present

## 2014-07-13 ENCOUNTER — Ambulatory Visit
Admission: RE | Admit: 2014-07-13 | Discharge: 2014-07-13 | Disposition: A | Discharge status: Home / Self Care | Payer: Medicare Other | Source: Ambulatory Visit | Attending: Radiation Oncology | Admitting: Radiation Oncology

## 2014-07-13 DIAGNOSIS — C61 Malignant neoplasm of prostate: Secondary | ICD-10-CM | POA: Diagnosis not present

## 2014-07-13 DIAGNOSIS — Z510 Encounter for antineoplastic radiation therapy: Secondary | ICD-10-CM | POA: Diagnosis not present

## 2014-07-14 ENCOUNTER — Ambulatory Visit
Admission: RE | Admit: 2014-07-14 | Discharge: 2014-07-14 | Disposition: A | Discharge status: Home / Self Care | Payer: Medicare Other | Source: Ambulatory Visit | Attending: Radiation Oncology | Admitting: Radiation Oncology

## 2014-07-14 DIAGNOSIS — Z510 Encounter for antineoplastic radiation therapy: Secondary | ICD-10-CM | POA: Diagnosis not present

## 2014-07-14 DIAGNOSIS — C61 Malignant neoplasm of prostate: Secondary | ICD-10-CM | POA: Diagnosis not present

## 2014-07-17 ENCOUNTER — Ambulatory Visit
Admission: RE | Admit: 2014-07-17 | Discharge: 2014-07-17 | Disposition: A | Discharge status: Home / Self Care | Payer: Medicare Other | Source: Ambulatory Visit | Attending: Radiation Oncology | Admitting: Radiation Oncology

## 2014-07-17 ENCOUNTER — Encounter: Payer: Self-pay | Admitting: Radiation Oncology

## 2014-07-17 VITALS — BP 119/55 | HR 78 | Temp 98.4°F | Resp 12 | Wt 220.1 lb

## 2014-07-17 DIAGNOSIS — C61 Malignant neoplasm of prostate: Secondary | ICD-10-CM

## 2014-07-17 DIAGNOSIS — Z510 Encounter for antineoplastic radiation therapy: Secondary | ICD-10-CM | POA: Diagnosis not present

## 2014-07-17 NOTE — Progress Notes (Signed)
He rates his pain as a 4 on a scale of 0-10. constant, aching and "swollen" over bilateral groin.  Pt complains of fatigue and poor appetite Pt reports urinary frequency, hesistency, hot flashes and dribbling. Pt states they urinate 2 - 3 times per night.  Pt reports a soft bowel movement everyday/everyother day. Reports taking Simethicone for gas which is helping greatly. BP 119/55 mmHg  Pulse 78  Temp(Src) 98.4 F (36.9 C) (Oral)  Resp 12  Wt 220 lb 1.6 oz (99.837 kg)  SpO2 100%

## 2014-07-17 NOTE — Progress Notes (Signed)
Weekly Management Note:  Site: Prostate Current Dose:  5265  cGy Projected Dose: 7800  cGy  Narrative: The patient is seen today for routine under treatment assessment. CBCT/MVCT images/port films were reviewed. The chart was reviewed.   Bladder filling today is less than ideal is satisfactory.  He does omit to some urinary frequency and hesitancy, but this is unchanged.  He has been taking simethicone for gas which has been beneficial.  He continues with his tamsulosin.  Physical Examination:  Filed Vitals:   07/17/14 1511  BP: 119/55  Pulse: 78  Temp: 98.4 F (36.9 C)  Resp: 12  .  Weight: 220 lb 1.6 oz (99.837 kg).  No change.  Impression: Tolerating radiation therapy well.  I encouraged him to improve his bladder filling.  Plan: Continue radiation therapy as planned.

## 2014-07-18 ENCOUNTER — Ambulatory Visit
Admission: RE | Admit: 2014-07-18 | Discharge: 2014-07-18 | Disposition: A | Discharge status: Home / Self Care | Payer: Medicare Other | Source: Ambulatory Visit | Attending: Radiation Oncology | Admitting: Radiation Oncology

## 2014-07-18 DIAGNOSIS — Z510 Encounter for antineoplastic radiation therapy: Secondary | ICD-10-CM | POA: Diagnosis not present

## 2014-07-18 DIAGNOSIS — C61 Malignant neoplasm of prostate: Secondary | ICD-10-CM | POA: Diagnosis not present

## 2014-07-19 ENCOUNTER — Ambulatory Visit
Admission: RE | Admit: 2014-07-19 | Discharge: 2014-07-19 | Disposition: A | Discharge status: Home / Self Care | Payer: Medicare Other | Source: Ambulatory Visit | Attending: Radiation Oncology | Admitting: Radiation Oncology

## 2014-07-19 DIAGNOSIS — Z510 Encounter for antineoplastic radiation therapy: Secondary | ICD-10-CM | POA: Diagnosis not present

## 2014-07-19 DIAGNOSIS — C61 Malignant neoplasm of prostate: Secondary | ICD-10-CM | POA: Diagnosis not present

## 2014-07-20 ENCOUNTER — Ambulatory Visit
Admission: RE | Admit: 2014-07-20 | Discharge: 2014-07-20 | Disposition: A | Discharge status: Home / Self Care | Payer: Medicare Other | Source: Ambulatory Visit | Attending: Radiation Oncology | Admitting: Radiation Oncology

## 2014-07-20 DIAGNOSIS — C61 Malignant neoplasm of prostate: Secondary | ICD-10-CM | POA: Diagnosis not present

## 2014-07-20 DIAGNOSIS — Z510 Encounter for antineoplastic radiation therapy: Secondary | ICD-10-CM | POA: Diagnosis not present

## 2014-07-21 ENCOUNTER — Ambulatory Visit
Admission: RE | Admit: 2014-07-21 | Discharge: 2014-07-21 | Disposition: A | Discharge status: Home / Self Care | Payer: Medicare Other | Source: Ambulatory Visit | Attending: Radiation Oncology | Admitting: Radiation Oncology

## 2014-07-21 DIAGNOSIS — C61 Malignant neoplasm of prostate: Secondary | ICD-10-CM | POA: Diagnosis not present

## 2014-07-21 DIAGNOSIS — Z510 Encounter for antineoplastic radiation therapy: Secondary | ICD-10-CM | POA: Diagnosis not present

## 2014-07-24 ENCOUNTER — Ambulatory Visit: Admission: RE | Admit: 2014-07-24 | Payer: Medicare Other | Source: Ambulatory Visit

## 2014-07-24 ENCOUNTER — Ambulatory Visit
Admission: RE | Admit: 2014-07-24 | Discharge: 2014-07-24 | Disposition: A | Discharge status: Home / Self Care | Payer: Medicare Other | Source: Ambulatory Visit | Attending: Radiation Oncology | Admitting: Radiation Oncology

## 2014-07-24 ENCOUNTER — Ambulatory Visit: Admission: RE | Admit: 2014-07-24 | Payer: Medicare Other | Source: Ambulatory Visit | Admitting: Radiation Oncology

## 2014-07-25 ENCOUNTER — Ambulatory Visit
Admission: RE | Admit: 2014-07-25 | Discharge: 2014-07-25 | Disposition: A | Discharge status: Home / Self Care | Payer: Medicare Other | Source: Ambulatory Visit | Attending: Radiation Oncology | Admitting: Radiation Oncology

## 2014-07-25 VITALS — BP 129/57 | HR 67 | Temp 98.4°F | Ht 72.0 in | Wt 223.2 lb

## 2014-07-25 DIAGNOSIS — C61 Malignant neoplasm of prostate: Secondary | ICD-10-CM | POA: Diagnosis not present

## 2014-07-25 DIAGNOSIS — Z510 Encounter for antineoplastic radiation therapy: Secondary | ICD-10-CM | POA: Diagnosis not present

## 2014-07-25 NOTE — Progress Notes (Signed)
Weekly Management Note:  Site: Prostate Current Dose:  6240  cGy Projected Dose: 7800  cGy  Narrative: The patient is seen today for routine under treatment assessment. CBCT/MVCT images/port films were reviewed. The chart was reviewed.   Bladder filling is satisfactory.  No new GU or GI difficulties.  He has nocturia 3-4.  He continues with tamsulosin.  Physical Examination:  Filed Vitals:   07/25/14 1552  BP: 129/57  Pulse: 67  Temp: 98.4 F (36.9 C)  .  Weight: 223 lb 3.2 oz (101.243 kg).  No change.  Impression: Tolerating radiation therapy well.  Plan: Continue radiation therapy as planned.

## 2014-07-25 NOTE — Progress Notes (Addendum)
Mr. Adrian Ellis has received 32 fractions to his pelvis for prostate cancer. He reports straining to void, Nocturia 3-4 times, softner, more frequent stools x 2 today and "some fatigue". He denies any dysuria, nor proctitis

## 2014-07-26 ENCOUNTER — Ambulatory Visit
Admission: RE | Admit: 2014-07-26 | Discharge: 2014-07-26 | Disposition: A | Discharge status: Home / Self Care | Payer: Medicare Other | Source: Ambulatory Visit | Attending: Radiation Oncology | Admitting: Radiation Oncology

## 2014-07-26 DIAGNOSIS — Z510 Encounter for antineoplastic radiation therapy: Secondary | ICD-10-CM | POA: Diagnosis not present

## 2014-07-26 DIAGNOSIS — C61 Malignant neoplasm of prostate: Secondary | ICD-10-CM | POA: Diagnosis not present

## 2014-07-27 ENCOUNTER — Ambulatory Visit
Admission: RE | Admit: 2014-07-27 | Discharge: 2014-07-27 | Disposition: A | Discharge status: Home / Self Care | Payer: Medicare Other | Source: Ambulatory Visit | Attending: Radiation Oncology | Admitting: Radiation Oncology

## 2014-07-27 DIAGNOSIS — C61 Malignant neoplasm of prostate: Secondary | ICD-10-CM | POA: Diagnosis not present

## 2014-07-27 DIAGNOSIS — Z510 Encounter for antineoplastic radiation therapy: Secondary | ICD-10-CM | POA: Diagnosis not present

## 2014-07-28 ENCOUNTER — Ambulatory Visit
Admission: RE | Admit: 2014-07-28 | Discharge: 2014-07-28 | Disposition: A | Discharge status: Home / Self Care | Payer: Medicare Other | Source: Ambulatory Visit | Attending: Radiation Oncology | Admitting: Radiation Oncology

## 2014-07-28 DIAGNOSIS — Z510 Encounter for antineoplastic radiation therapy: Secondary | ICD-10-CM | POA: Diagnosis not present

## 2014-07-28 DIAGNOSIS — C61 Malignant neoplasm of prostate: Secondary | ICD-10-CM | POA: Diagnosis not present

## 2014-07-31 ENCOUNTER — Ambulatory Visit
Admission: RE | Admit: 2014-07-31 | Discharge: 2014-07-31 | Disposition: A | Discharge status: Home / Self Care | Payer: Medicare Other | Source: Ambulatory Visit | Attending: Radiation Oncology | Admitting: Radiation Oncology

## 2014-07-31 ENCOUNTER — Encounter: Payer: Self-pay | Admitting: Radiation Oncology

## 2014-07-31 DIAGNOSIS — Z510 Encounter for antineoplastic radiation therapy: Secondary | ICD-10-CM | POA: Diagnosis not present

## 2014-07-31 DIAGNOSIS — C61 Malignant neoplasm of prostate: Secondary | ICD-10-CM | POA: Diagnosis not present

## 2014-08-01 ENCOUNTER — Ambulatory Visit: Payer: Medicare Other

## 2014-08-01 ENCOUNTER — Ambulatory Visit
Admission: RE | Admit: 2014-08-01 | Discharge: 2014-08-01 | Disposition: A | Discharge status: Home / Self Care | Payer: Medicare Other | Source: Ambulatory Visit | Attending: Radiation Oncology | Admitting: Radiation Oncology

## 2014-08-01 ENCOUNTER — Encounter: Payer: Self-pay | Admitting: Radiation Oncology

## 2014-08-01 VITALS — BP 139/58 | HR 67 | Temp 97.5°F | Resp 20 | Wt 221.8 lb

## 2014-08-01 DIAGNOSIS — C61 Malignant neoplasm of prostate: Secondary | ICD-10-CM

## 2014-08-01 DIAGNOSIS — Z510 Encounter for antineoplastic radiation therapy: Secondary | ICD-10-CM | POA: Diagnosis not present

## 2014-08-01 NOTE — Progress Notes (Signed)
Patient reports nocturia x 3, denies other urinary issues, denies bowel issues, loss of appetite. He states he has become somewhat fatigued.  BP 139/58 mmHg  Pulse 67  Temp(Src) 97.5 F (36.4 C) (Oral)  Resp 20  Wt 221 lb 12.8 oz (100.608 kg)

## 2014-08-01 NOTE — Progress Notes (Signed)
Weekly Management Note:  Site: Prostate  Current Dose:  7215  cGy Projected Dose: 7800  cGy  Narrative: The patient is seen today for routine under treatment assessment. CBCT/MVCT images/port films were reviewed. The chart was reviewed.   Bladder filling is satisfactory.  No new GU or GI difficulties.  He finishes his radiation therapy this Friday.  Physical Examination:  Filed Vitals:   08/01/14 1539  BP: 139/58  Pulse: 67  Temp: 97.5 F (36.4 C)  Resp: 20  .  Weight: 221 lb 12.8 oz (100.608 kg).  No change.  Impression: Tolerating radiation therapy well.  Plan: Continue radiation therapy as planned.  One-month follow-up visit after completion of radiation therapy this Friday.

## 2014-08-02 ENCOUNTER — Ambulatory Visit: Payer: Medicare Other

## 2014-08-02 ENCOUNTER — Ambulatory Visit
Admission: RE | Admit: 2014-08-02 | Discharge: 2014-08-02 | Disposition: A | Discharge status: Home / Self Care | Payer: Medicare Other | Source: Ambulatory Visit | Attending: Radiation Oncology | Admitting: Radiation Oncology

## 2014-08-02 DIAGNOSIS — Z510 Encounter for antineoplastic radiation therapy: Secondary | ICD-10-CM | POA: Diagnosis not present

## 2014-08-02 DIAGNOSIS — C61 Malignant neoplasm of prostate: Secondary | ICD-10-CM | POA: Diagnosis not present

## 2014-08-03 ENCOUNTER — Ambulatory Visit
Admission: RE | Admit: 2014-08-03 | Discharge: 2014-08-03 | Disposition: A | Discharge status: Home / Self Care | Payer: Medicare Other | Source: Ambulatory Visit | Attending: Radiation Oncology | Admitting: Radiation Oncology

## 2014-08-03 ENCOUNTER — Ambulatory Visit: Payer: Medicare Other

## 2014-08-03 DIAGNOSIS — Z510 Encounter for antineoplastic radiation therapy: Secondary | ICD-10-CM | POA: Diagnosis not present

## 2014-08-03 DIAGNOSIS — C61 Malignant neoplasm of prostate: Secondary | ICD-10-CM | POA: Diagnosis not present

## 2014-08-04 ENCOUNTER — Ambulatory Visit: Payer: Medicare Other

## 2014-08-04 ENCOUNTER — Ambulatory Visit
Admission: RE | Admit: 2014-08-04 | Discharge: 2014-08-04 | Disposition: A | Discharge status: Home / Self Care | Payer: Medicare Other | Source: Ambulatory Visit | Attending: Radiation Oncology | Admitting: Radiation Oncology

## 2014-08-04 DIAGNOSIS — Z510 Encounter for antineoplastic radiation therapy: Secondary | ICD-10-CM | POA: Diagnosis not present

## 2014-08-04 DIAGNOSIS — C61 Malignant neoplasm of prostate: Secondary | ICD-10-CM | POA: Diagnosis not present

## 2014-08-06 ENCOUNTER — Encounter: Payer: Self-pay | Admitting: Radiation Oncology

## 2014-08-06 NOTE — Progress Notes (Signed)
[facility] End of Treatment Note  Name:Adrian Ellis  Date: 08/06/2014 YBO:[id number] DOB:[birth year]   Status:outpatient    CC: Maggie Font, MD  Dr. Lowella Bandy  REFERRING PHYSICIAN:   Dr. 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 tolerated his treatment well with no significant GU or GI toxicity by completion of therapy.  He remains on tamsulosin.  As we discussed earlier, he should continue with androgen deprivation therapy for a minimum of 9-12 months for his intermediate to high-risk disease.                      PLAN: Routine followup in one month. Patient instructed to call if questions or worsening complaints in interim.

## 2014-08-08 DIAGNOSIS — H25813 Combined forms of age-related cataract, bilateral: Secondary | ICD-10-CM | POA: Diagnosis not present

## 2014-08-10 ENCOUNTER — Ambulatory Visit: Payer: Medicare Other

## 2014-08-15 DIAGNOSIS — I10 Essential (primary) hypertension: Secondary | ICD-10-CM | POA: Diagnosis not present

## 2014-08-15 DIAGNOSIS — E039 Hypothyroidism, unspecified: Secondary | ICD-10-CM | POA: Diagnosis not present

## 2014-08-18 DIAGNOSIS — C61 Malignant neoplasm of prostate: Secondary | ICD-10-CM | POA: Diagnosis not present

## 2014-08-18 DIAGNOSIS — Z5111 Encounter for antineoplastic chemotherapy: Secondary | ICD-10-CM | POA: Diagnosis not present

## 2014-08-19 DIAGNOSIS — R42 Dizziness and giddiness: Secondary | ICD-10-CM | POA: Diagnosis not present

## 2014-09-12 DIAGNOSIS — I10 Essential (primary) hypertension: Secondary | ICD-10-CM | POA: Diagnosis not present

## 2014-10-30 DIAGNOSIS — C61 Malignant neoplasm of prostate: Secondary | ICD-10-CM | POA: Diagnosis not present

## 2014-11-03 DIAGNOSIS — C61 Malignant neoplasm of prostate: Secondary | ICD-10-CM | POA: Diagnosis not present

## 2014-12-05 DIAGNOSIS — I10 Essential (primary) hypertension: Secondary | ICD-10-CM | POA: Diagnosis not present

## 2014-12-05 DIAGNOSIS — E039 Hypothyroidism, unspecified: Secondary | ICD-10-CM | POA: Diagnosis not present

## 2014-12-07 DIAGNOSIS — I10 Essential (primary) hypertension: Secondary | ICD-10-CM | POA: Diagnosis not present

## 2015-01-10 DIAGNOSIS — K625 Hemorrhage of anus and rectum: Secondary | ICD-10-CM | POA: Diagnosis not present

## 2015-01-15 DIAGNOSIS — R195 Other fecal abnormalities: Secondary | ICD-10-CM | POA: Diagnosis not present

## 2015-01-15 DIAGNOSIS — I10 Essential (primary) hypertension: Secondary | ICD-10-CM | POA: Diagnosis not present

## 2015-01-15 DIAGNOSIS — E039 Hypothyroidism, unspecified: Secondary | ICD-10-CM | POA: Diagnosis not present

## 2015-01-16 DIAGNOSIS — E119 Type 2 diabetes mellitus without complications: Secondary | ICD-10-CM | POA: Diagnosis not present

## 2015-01-16 DIAGNOSIS — K625 Hemorrhage of anus and rectum: Secondary | ICD-10-CM | POA: Diagnosis not present

## 2015-01-29 DIAGNOSIS — C61 Malignant neoplasm of prostate: Secondary | ICD-10-CM | POA: Diagnosis not present

## 2015-02-01 DIAGNOSIS — D125 Benign neoplasm of sigmoid colon: Secondary | ICD-10-CM | POA: Diagnosis not present

## 2015-02-01 DIAGNOSIS — D124 Benign neoplasm of descending colon: Secondary | ICD-10-CM | POA: Diagnosis not present

## 2015-02-01 DIAGNOSIS — K635 Polyp of colon: Secondary | ICD-10-CM | POA: Diagnosis not present

## 2015-02-01 DIAGNOSIS — R195 Other fecal abnormalities: Secondary | ICD-10-CM | POA: Diagnosis not present

## 2015-02-05 DIAGNOSIS — C61 Malignant neoplasm of prostate: Secondary | ICD-10-CM | POA: Diagnosis not present

## 2015-02-08 DIAGNOSIS — H25813 Combined forms of age-related cataract, bilateral: Secondary | ICD-10-CM | POA: Diagnosis not present

## 2015-02-14 DIAGNOSIS — K625 Hemorrhage of anus and rectum: Secondary | ICD-10-CM | POA: Diagnosis not present

## 2015-02-21 DIAGNOSIS — K625 Hemorrhage of anus and rectum: Secondary | ICD-10-CM | POA: Diagnosis not present

## 2015-02-25 ENCOUNTER — Inpatient Hospital Stay (HOSPITAL_COMMUNITY)
Admission: EM | Admit: 2015-02-25 | Discharge: 2015-02-27 | DRG: 378 | Disposition: A | Discharge status: Home / Self Care | Payer: Medicare Other | Attending: Internal Medicine | Admitting: Internal Medicine

## 2015-02-25 ENCOUNTER — Encounter (HOSPITAL_COMMUNITY): Payer: Self-pay | Admitting: Emergency Medicine

## 2015-02-25 DIAGNOSIS — E039 Hypothyroidism, unspecified: Secondary | ICD-10-CM | POA: Diagnosis not present

## 2015-02-25 DIAGNOSIS — Z87891 Personal history of nicotine dependence: Secondary | ICD-10-CM | POA: Diagnosis not present

## 2015-02-25 DIAGNOSIS — N189 Chronic kidney disease, unspecified: Secondary | ICD-10-CM

## 2015-02-25 DIAGNOSIS — Z8546 Personal history of malignant neoplasm of prostate: Secondary | ICD-10-CM

## 2015-02-25 DIAGNOSIS — N179 Acute kidney failure, unspecified: Secondary | ICD-10-CM | POA: Diagnosis not present

## 2015-02-25 DIAGNOSIS — E860 Dehydration: Secondary | ICD-10-CM | POA: Diagnosis not present

## 2015-02-25 DIAGNOSIS — Z923 Personal history of irradiation: Secondary | ICD-10-CM | POA: Diagnosis not present

## 2015-02-25 DIAGNOSIS — Z79899 Other long term (current) drug therapy: Secondary | ICD-10-CM

## 2015-02-25 DIAGNOSIS — K922 Gastrointestinal hemorrhage, unspecified: Principal | ICD-10-CM | POA: Diagnosis present

## 2015-02-25 DIAGNOSIS — C61 Malignant neoplasm of prostate: Secondary | ICD-10-CM | POA: Diagnosis present

## 2015-02-25 DIAGNOSIS — K921 Melena: Secondary | ICD-10-CM | POA: Diagnosis present

## 2015-02-25 DIAGNOSIS — N182 Chronic kidney disease, stage 2 (mild): Secondary | ICD-10-CM | POA: Diagnosis present

## 2015-02-25 DIAGNOSIS — F329 Major depressive disorder, single episode, unspecified: Secondary | ICD-10-CM | POA: Diagnosis not present

## 2015-02-25 DIAGNOSIS — I129 Hypertensive chronic kidney disease with stage 1 through stage 4 chronic kidney disease, or unspecified chronic kidney disease: Secondary | ICD-10-CM | POA: Diagnosis present

## 2015-02-25 DIAGNOSIS — K625 Hemorrhage of anus and rectum: Secondary | ICD-10-CM | POA: Diagnosis not present

## 2015-02-25 DIAGNOSIS — I10 Essential (primary) hypertension: Secondary | ICD-10-CM | POA: Diagnosis not present

## 2015-02-25 DIAGNOSIS — K2970 Gastritis, unspecified, without bleeding: Secondary | ICD-10-CM | POA: Diagnosis not present

## 2015-02-25 HISTORY — DX: Hypothyroidism, unspecified: E03.9

## 2015-02-25 HISTORY — DX: Chronic kidney disease, stage 2 (mild): N18.2

## 2015-02-25 LAB — CBC
HCT: 33.5 % — ABNORMAL LOW (ref 39.0–52.0)
HCT: 37.1 % — ABNORMAL LOW (ref 39.0–52.0)
HEMOGLOBIN: 11.2 g/dL — AB (ref 13.0–17.0)
HEMOGLOBIN: 12.3 g/dL — AB (ref 13.0–17.0)
MCH: 30.7 pg (ref 26.0–34.0)
MCH: 30.6 pg (ref 26.0–34.0)
MCHC: 33.4 g/dL (ref 30.0–36.0)
MCHC: 33.2 g/dL (ref 30.0–36.0)
MCV: 91.8 fL (ref 78.0–100.0)
MCV: 92.3 fL (ref 78.0–100.0)
PLATELETS: 246 10*3/uL (ref 150–400)
Platelets: 262 10*3/uL (ref 150–400)
RBC: 3.65 MIL/uL — ABNORMAL LOW (ref 4.22–5.81)
RBC: 4.02 MIL/uL — ABNORMAL LOW (ref 4.22–5.81)
RDW: 13.9 % (ref 11.5–15.5)
RDW: 13.9 % (ref 11.5–15.5)
WBC: 6.6 10*3/uL (ref 4.0–10.5)
WBC: 8.3 10*3/uL (ref 4.0–10.5)

## 2015-02-25 LAB — POC OCCULT BLOOD, ED: Fecal Occult Bld: POSITIVE — AB

## 2015-02-25 LAB — COMPREHENSIVE METABOLIC PANEL
ALBUMIN: 3.7 g/dL (ref 3.5–5.0)
ALK PHOS: 65 U/L (ref 38–126)
ALT: 28 U/L (ref 17–63)
AST: 32 U/L (ref 15–41)
Anion gap: 10 (ref 5–15)
BILIRUBIN TOTAL: 0.5 mg/dL (ref 0.3–1.2)
BUN: 15 mg/dL (ref 6–20)
CALCIUM: 9.5 mg/dL (ref 8.9–10.3)
CO2: 25 mmol/L (ref 22–32)
Chloride: 102 mmol/L (ref 101–111)
Creatinine, Ser: 1.28 mg/dL — ABNORMAL HIGH (ref 0.61–1.24)
GFR calc Af Amer: >60 mL/min (ref >60)
GFR calc non Af Amer: 53 mL/min — ABNORMAL LOW (ref >60)
Glucose, Bld: 158 mg/dL — ABNORMAL HIGH (ref 65–99)
Potassium: 3.9 mmol/L (ref 3.5–5.1)
SODIUM: 137 mmol/L (ref 135–145)
Total Protein: 6.5 g/dL (ref 6.5–8.1)

## 2015-02-25 LAB — PROTIME-INR
INR: 1.03 (ref 0.00–1.49)
PROTHROMBIN TIME: 13.7 s (ref 11.6–15.2)

## 2015-02-25 LAB — TYPE AND SCREEN
ABO/RH(D): B POS
Antibody Screen: NEGATIVE

## 2015-02-25 LAB — APTT: aPTT: 30 seconds (ref 24–37)

## 2015-02-25 LAB — CREATININE, URINE, RANDOM: CREATININE, URINE: 64.58 mg/dL

## 2015-02-25 LAB — ABO/RH: ABO/RH(D): B POS

## 2015-02-25 LAB — SODIUM, URINE, RANDOM: SODIUM UR: 99 mmol/L

## 2015-02-25 MED ORDER — HYDRALAZINE HCL 20 MG/ML IJ SOLN: 5.0000 mg | INTRAMUSCULAR | Status: DC | PRN

## 2015-02-25 MED ORDER — TAMSULOSIN HCL 0.4 MG PO CAPS
0.4000 mg | ORAL_CAPSULE | Freq: Every day | ORAL | Status: DC
  Administered 2015-02-26 – 2015-02-27 (×2): 0.4 mg via ORAL
  Filled 2015-02-25 (×2): qty 1

## 2015-02-25 MED ORDER — ONDANSETRON HCL 4 MG PO TABS: 4.0000 mg | ORAL_TABLET | Freq: Four times a day (QID) | ORAL | Status: DC | PRN

## 2015-02-25 MED ORDER — PANTOPRAZOLE SODIUM 40 MG IV SOLR
40.0000 mg | Freq: Two times a day (BID) | INTRAVENOUS | Status: DC
  Administered 2015-02-25 – 2015-02-27 (×4): 40 mg via INTRAVENOUS
  Filled 2015-02-25 (×4): qty 40

## 2015-02-25 MED ORDER — ADULT MULTIVITAMIN W/MINERALS CH: 1.0000 | ORAL_TABLET | Freq: Every day | ORAL | Status: DC

## 2015-02-25 MED ORDER — SODIUM CHLORIDE 0.9 % IV BOLUS (SEPSIS)
1000.0000 mL | Freq: Once | INTRAVENOUS | Status: AC
  Administered 2015-02-25: 1000 mL via INTRAVENOUS

## 2015-02-25 MED ORDER — ADULT MULTIVITAMIN W/MINERALS CH
1.0000 | ORAL_TABLET | Freq: Every day | ORAL | Status: DC
  Administered 2015-02-26 – 2015-02-27 (×2): 1 via ORAL
  Filled 2015-02-25 (×2): qty 1

## 2015-02-25 MED ORDER — DUTASTERIDE 0.5 MG PO CAPS
0.5000 mg | ORAL_CAPSULE | Freq: Every day | ORAL | Status: DC
  Administered 2015-02-26 – 2015-02-27 (×2): 0.5 mg via ORAL
  Filled 2015-02-25 (×2): qty 1

## 2015-02-25 MED ORDER — ACETAMINOPHEN 325 MG PO TABS: 650.0000 mg | ORAL_TABLET | Freq: Four times a day (QID) | ORAL | Status: DC | PRN

## 2015-02-25 MED ORDER — SODIUM CHLORIDE 0.9 % IJ SOLN
3.0000 mL | Freq: Two times a day (BID) | INTRAMUSCULAR | Status: DC
  Administered 2015-02-26 – 2015-02-27 (×3): 3 mL via INTRAVENOUS

## 2015-02-25 MED ORDER — ONDANSETRON HCL 4 MG/2ML IJ SOLN: 4.0000 mg | Freq: Four times a day (QID) | INTRAMUSCULAR | Status: DC | PRN

## 2015-02-25 MED ORDER — TAMSULOSIN HCL 0.4 MG PO CAPS: 0.4000 mg | ORAL_CAPSULE | Freq: Every day | ORAL | Status: DC

## 2015-02-25 MED ORDER — ACETAMINOPHEN 650 MG RE SUPP: 650.0000 mg | Freq: Four times a day (QID) | RECTAL | Status: DC | PRN

## 2015-02-25 MED ORDER — LEVOTHYROXINE SODIUM 112 MCG PO TABS
112.0000 ug | ORAL_TABLET | Freq: Every day | ORAL | Status: DC
  Administered 2015-02-26 – 2015-02-27 (×2): 112 ug via ORAL
  Filled 2015-02-25 (×2): qty 1

## 2015-02-25 MED ORDER — SODIUM CHLORIDE 0.9 % IV SOLN
INTRAVENOUS | Status: DC
  Administered 2015-02-25 – 2015-02-27 (×3): via INTRAVENOUS

## 2015-02-25 NOTE — ED Provider Notes (Signed)
CSN: [id number]     Arrival date & time 02/25/15  1514 History   First MD Initiated Contact with Patient 02/25/15 1554     Chief Complaint  Patient presents with  . Rectal Bleeding     (Consider location/radiation/quality/duration/timing/severity/associated sxs/prior Treatment) HPI Comments: Patient is a 74 year old male with a past medical history of hypertension, prostate cancer, and hypothyroidism who presents with rectal bleeding that acutely worsened this morning. Patient reports having a 1 month history of scant amount of rectal bleeding with bowel movements but today he had 2 episodes where he "gushed" blood from his rectum. Patient reports he "soiled his underclothes" and had to throw them away. He denies abdominal pain or any other symptoms. No aggravating/alleviaiting factors. Patient reports having a colonoscopy 2 weeks ago by Dr. Benson Norway where they found and removed 4 polyps. He does not take blood thinners.    Past Medical History  Diagnosis Date  . Hypertension   . Thyroid disease   . Prostate enlargement   . Sinus problem   . Prostate cancer ([place]) 02/23/14    gleason 3+4=7, volume 25.98 cc  . Microhematuria     history of, work-up in 2009 negative  . Lower urinary tract symptoms (LUTS)    Past Surgical History  Procedure Laterality Date  . Tonsillectomy      age 88  . Prostate biopsy  02/23/14    Gleason 7, volume 25.98 cc   Family History  Problem Relation Age of Onset  . Aneurysm Mother   . Cancer Brother    Social History  Substance Use Topics  . Smoking status: Former Smoker -- 0.25 packs/day  . Smokeless tobacco: None     Comment: quit many years ago  . Alcohol Use: No    Review of Systems  Gastrointestinal: Positive for rectal pain.  All other systems reviewed and are negative.     Allergies  Review of patient's allergies indicates no known allergies.  Home Medications   Prior to Admission medications   Medication Sig Start Date End Date  Taking? Authorizing Provider  Multiple Vitamins-Minerals (ALIVE MENS ENERGY PO) Take 0.4 mg by mouth.   Yes Historical Provider, MD  amlodipine-olmesartan (AZOR) 10-20 MG per tablet Take 1 tablet by mouth daily.    Historical Provider, MD  aspirin EC 81 MG tablet Take 81 mg by mouth daily.    Historical Provider, MD  dutasteride (AVODART) 0.5 MG capsule Take 0.5 mg by mouth daily.    Historical Provider, MD  levothyroxine (SYNTHROID, LEVOTHROID) 112 MCG tablet Take 112 mcg by mouth daily.    Historical Provider, MD  Multiple Vitamin (MULTIVITAMIN WITH MINERALS) TABS Take 1 tablet by mouth daily.    Historical Provider, MD  naproxen sodium (ANAPROX) 220 MG tablet Take 220 mg by mouth 2 (two) times daily with a meal.    Historical Provider, MD  simethicone (MYLICON) 80 MG chewable tablet Chew 80 mg by mouth once.    Historical Provider, MD  tamsulosin (FLOMAX) 0.4 MG CAPS capsule Take 0.4 mg by mouth daily.    Historical Provider, MD   BP 150/58 mmHg  Pulse 78  Temp(Src) 98.2 F (36.8 C) (Oral)  Resp 20  Ht 6' (1.829 m)  Wt 217 lb 8 oz (98.657 kg)  BMI 29.49 kg/m2  SpO2 98% Physical Exam  Constitutional: He is oriented to person, place, and time. He appears well-developed and well-nourished. No distress.  HENT:  Head: Normocephalic and atraumatic.  Eyes: Conjunctivae  and EOM are normal.  Neck: Normal range of motion.  Cardiovascular: Normal rate and regular rhythm.  Exam reveals no gallop and no friction rub.   No murmur heard. Pulmonary/Chest: Effort normal and breath sounds normal. He has no wheezes. He has no rales. He exhibits no tenderness.  Abdominal: Soft. There is no tenderness.  Genitourinary: Rectum normal.  No frank blood per rectum.   Musculoskeletal: Normal range of motion.  Neurological: He is alert and oriented to person, place, and time. Coordination normal.  Speech is goal-oriented. Moves limbs without ataxia.   Skin: Skin is warm and dry.  Psychiatric: He has a  normal mood and affect. His behavior is normal.  Nursing note and vitals reviewed.   ED Course  Procedures (including critical care time) Labs Review Labs Reviewed  COMPREHENSIVE METABOLIC PANEL - Abnormal; Notable for the following:    Glucose, Bld 158 (*)    Creatinine, Ser 1.28 (*)    GFR calc non Af Amer 53 (*)    All other components within normal limits  CBC - Abnormal; Notable for the following:    RBC 3.65 (*)    Hemoglobin 11.2 (*)    HCT 33.5 (*)    All other components within normal limits  CBC - Abnormal; Notable for the following:    RBC 4.02 (*)    Hemoglobin 12.3 (*)    HCT 37.1 (*)    All other components within normal limits  POC OCCULT BLOOD, ED - Abnormal; Notable for the following:    Fecal Occult Bld POSITIVE (*)    All other components within normal limits  CREATININE, URINE, RANDOM  SODIUM, URINE, RANDOM  PROTIME-INR  APTT  CBC  CBC  BASIC METABOLIC PANEL  TYPE AND SCREEN  ABO/RH    Imaging Review No results found. I have personally reviewed and evaluated these images and lab results as part of my medical decision-making.   EKG Interpretation None      MDM   Final diagnoses:  Gastrointestinal hemorrhage, unspecified gastritis, unspecified gastrointestinal hemorrhage type  AKI (acute kidney injury) ([place])   4:20 PM Labs and occult stool card pending. Vitals stable and patient afebrile.   Occult stool card positive. Hemoglobin mildly decreased. Patient will be admitted for GI bleed workup.   Alvina Chou, PA-C 02/25/15 [place], MD 02/27/15 1530

## 2015-02-25 NOTE — ED Notes (Signed)
Pt states he was seen by his GI MD for a colonoscopy 2 weeks ago. Has had some spotting ever since. Increase in bleeding starting today. States it started "gushing out today."

## 2015-02-25 NOTE — ED Notes (Signed)
Attempted to call report

## 2015-02-25 NOTE — H&P (Signed)
[facility] History and Physical  Rob Mciver CHY:[id number] DOB: 01/11/1941 DOA: 02/25/2015  Referring physician: ED physician PCP: Maggie Font, MD  Specialists:   Chief Complaint: Rectal bleeding  HPI: Adrian Ellis is a 74 y.o. male with PMH of prostate cancer (s/p of radiation therapy), hypertension, hypothyroidism, depression, CKD-II colon polyps, s/p of polypectomy, who presents with rectal bleeding.  Patient reports that he has intermittent rectal bleeding for nearly a month. He was seen by GI, Dr. Benson Norway 2 weeks ago, and had polypectomy. He has been doing well until today when he started having rectal bleeding again. He had 2 episode of rectal bleeding today. Patient does not have dizziness, lightheadedness, abdominal pain, nausea, vomiting, chest pain, shortness of breath. No diarrhea, symptoms of UTI or unilateral weakness..  In ED, patient was found to have positive FOBT, hemoglobin dropped from 13.2 on 04/24/13 to 11.2, WBC 6.6, temperature normal, no tachycardia, worsening renal function. Patient is admitted to inpatient for further evaluation and treatment. GI is consulted by ED.  Where does patient live?   At home  Can patient participate in ADLs?  Yes     Review of Systems:   General: no fevers, chills, no changes in body weight, has fatigue HEENT: no blurry vision, hearing changes or sore throat Pulm: no dyspnea, coughing, wheezing CV: no chest pain, palpitations Abd: no nausea, vomiting, abdominal pain, diarrhea, constipation. Has rectal bleeding. GU: no dysuria, burning on urination, increased urinary frequency, hematuria  Ext: no leg edema Neuro: no unilateral weakness, numbness, or tingling, no vision change or hearing loss Skin: no rash MSK: No muscle spasm, no deformity, no limitation of range of movement in spin Heme: No easy bruising.  Travel history: No recent long distant travel.  Allergy: No Known Allergies  Past Medical History  Diagnosis Date   . Hypertension   . Thyroid disease   . Prostate enlargement   . Sinus problem   . Prostate cancer ([place]) 02/23/14    gleason 3+4=7, volume 25.98 cc  . Microhematuria     history of, work-up in 2009 negative  . Lower urinary tract symptoms (LUTS)   . Hypothyroidism   . CKD (chronic kidney disease), stage II     Past Surgical History  Procedure Laterality Date  . Tonsillectomy      age 52  . Prostate biopsy  02/23/14    Gleason 7, volume 25.98 cc    Social History:  reports that he has quit smoking. He does not have any smokeless tobacco history on file. He reports that he does not drink alcohol or use illicit drugs.  Family History:  Family History  Problem Relation Age of Onset  . Aneurysm Mother   . Cancer Brother      Prior to Admission medications   Medication Sig Start Date End Date Taking? Authorizing Provider  amlodipine-olmesartan (AZOR) 10-20 MG per tablet Take 1 tablet by mouth daily.   Yes Historical Provider, MD  aspirin EC 81 MG tablet Take 81 mg by mouth daily.   Yes Historical Provider, MD  dutasteride (AVODART) 0.5 MG capsule Take 0.5 mg by mouth daily.   Yes Historical Provider, MD  levothyroxine (SYNTHROID, LEVOTHROID) 112 MCG tablet Take 112 mcg by mouth daily.   Yes Historical Provider, MD  Multiple Vitamin (MULTIVITAMIN WITH MINERALS) TABS Take 1 tablet by mouth daily.   Yes Historical Provider, MD  tamsulosin (FLOMAX) 0.4 MG CAPS capsule Take 0.4 mg by mouth daily.   Yes Historical Provider, MD  Physical Exam: Filed Vitals:   02/25/15 1900 02/25/15 1930 02/25/15 2000 02/25/15 2103  BP: 160/65 156/69 156/64 164/80  Pulse: 72 76 72 70  Temp:    98.2 F (36.8 C)  TempSrc:    Oral  Resp: 18 16 15 16   Height:    6' (1.829 m)  Weight:    97.1 kg (214 lb 1.1 oz)  SpO2: 98% 98% 99% 99%   General: Not in acute distress HEENT:       Eyes: PERRL, EOMI, no scleral icterus.       ENT: No discharge from the ears and nose, no pharynx injection, no  tonsillar enlargement.        Neck: No JVD, no bruit, no mass felt. Heme: No neck lymph node enlargement. Cardiac: S1/S2, RRR, No murmurs, No gallops or rubs. Pulm:  No rales, wheezing, rhonchi or rubs. Abd: Soft, nondistended, nontender, no rebound pain, no organomegaly, BS present. Ext: No pitting leg edema bilaterally. 2+DP/PT pulse bilaterally. Musculoskeletal: No joint deformities, No joint redness or warmth, no limitation of ROM in spin. Skin: No rashes.  Neuro: Alert, oriented X3, cranial nerves II-XII grossly intact, muscle strength 5/5 in all extremities, sensation to light touch intact.  Psych: Patient is not psychotic, no suicidal or hemocidal ideation.  Labs on Admission:  Basic Metabolic Panel:  Recent Labs Lab 02/25/15 1612  NA 137  K 3.9  CL 102  CO2 25  GLUCOSE 158*  BUN 15  CREATININE 1.28*  CALCIUM 9.5   Liver Function Tests:  Recent Labs Lab 02/25/15 1612  AST 32  ALT 28  ALKPHOS 65  BILITOT 0.5  PROT 6.5  ALBUMIN 3.7   No results for input(s): LIPASE, AMYLASE in the last 168 hours. No results for input(s): AMMONIA in the last 168 hours. CBC:  Recent Labs Lab 02/25/15 1612 02/25/15 2133 02/26/15 0130  WBC 6.6 8.3 7.2  HGB 11.2* 12.3* 11.8*  HCT 33.5* 37.1* 35.8*  MCV 91.8 92.3 91.3  PLT 246 262 254   Cardiac Enzymes: No results for input(s): CKTOTAL, CKMB, CKMBINDEX, TROPONINI in the last 168 hours.  BNP (last 3 results) No results for input(s): BNP in the last 8760 hours.  ProBNP (last 3 results) No results for input(s): PROBNP in the last 8760 hours.  CBG: No results for input(s): GLUCAP in the last 168 hours.  Radiological Exams on Admission: No results found.  EKG: Not done in ED, will get one.   Assessment/Plan Principal Problem:   Rectal bleeding Active Problems:   Hypertension   Malignant neoplasm of prostate (HCC)   Hypothyroidism   Acute on chronic kidney failure (HCC)  Rectal bleeding: Etiology is not clear,  potential differential diagnoses include bleeding from colon polyps and possible radiation-induced proctitis. GI was consulted by ED. currently patient is hemodynamically stable. Hemoglobin dropped from 13.2-->11.2.  - will admit to tele bed - GI consulted by Ed, will follow up recommendations - NPO for possible EGD - IVF: 1L NS and then 100 mL/hr - Start IV pantoprazole 40 mg bib - Zofran IV for nausea - Avoid NSAIDs and SQ heparin - Monitor closely and follow q6h cbc, transfuse as necessary. - LaB: INR, PTT  Hypertension: - Switch Zaor to amlodipine due to AKI - IV hydralazine when necessary  Malignant neoplasm of prostate [facility]): Has been treated by oncologist, Dr. Percell Miller. Completed radiotherapy on 07/2014. -on Flomax  Acute on chronic kidney failure-II [facility]): Likely due to prerenal secondary to dehydration and continuation  of ARB - IVF as above - Check FeNa - US-renal - Follow up renal function by BMP - Switched Zaor to amlodipine due to AKI  Hypothyroidism: Last TSH was not on record. -Continue home Synthroid -Check TSH  DVT ppx: SCD Code Status: Full code Family Communication:  Yes, patient's two nieces  at bed side Disposition Plan: Admit to inpatient   Date of Service 02/26/2015    Ivor Costa [facility] Pager [phone]  If 7PM-7AM, please contact night-coverage [email or web address] Password [email or web address] 02/26/2015, 2:56 AM

## 2015-02-25 NOTE — ED Notes (Signed)
Pt c/o rectal bleeding onset today. Pt reports that he has been bleeding for some time but increased today.

## 2015-02-26 ENCOUNTER — Encounter (HOSPITAL_COMMUNITY): Payer: Self-pay

## 2015-02-26 ENCOUNTER — Inpatient Hospital Stay (HOSPITAL_COMMUNITY): Payer: Medicare Other

## 2015-02-26 DIAGNOSIS — K625 Hemorrhage of anus and rectum: Secondary | ICD-10-CM

## 2015-02-26 DIAGNOSIS — K922 Gastrointestinal hemorrhage, unspecified: Secondary | ICD-10-CM | POA: Insufficient documentation

## 2015-02-26 DIAGNOSIS — N189 Chronic kidney disease, unspecified: Secondary | ICD-10-CM

## 2015-02-26 DIAGNOSIS — N179 Acute kidney failure, unspecified: Secondary | ICD-10-CM | POA: Insufficient documentation

## 2015-02-26 LAB — CBC
HCT: 35.8 % — ABNORMAL LOW (ref 39.0–52.0)
HCT: 38.7 % — ABNORMAL LOW (ref 39.0–52.0)
HCT: 35.9 % — ABNORMAL LOW (ref 39.0–52.0)
HEMATOCRIT: 35.7 % — AB (ref 39.0–52.0)
HEMOGLOBIN: 11.5 g/dL — AB (ref 13.0–17.0)
HEMOGLOBIN: 11.7 g/dL — AB (ref 13.0–17.0)
Hemoglobin: 11.8 g/dL — ABNORMAL LOW (ref 13.0–17.0)
Hemoglobin: 12.4 g/dL — ABNORMAL LOW (ref 13.0–17.0)
MCH: 30.1 pg (ref 26.0–34.0)
MCH: 29.5 pg (ref 26.0–34.0)
MCH: 29.6 pg (ref 26.0–34.0)
MCH: 30 pg (ref 26.0–34.0)
MCHC: 33 g/dL (ref 30.0–36.0)
MCHC: 32.2 g/dL (ref 30.0–36.0)
MCHC: 32 g/dL (ref 30.0–36.0)
MCHC: 32.6 g/dL (ref 30.0–36.0)
MCV: 91.3 fL (ref 78.0–100.0)
MCV: 91.5 fL (ref 78.0–100.0)
MCV: 92.4 fL (ref 78.0–100.0)
MCV: 92.1 fL (ref 78.0–100.0)
PLATELETS: 254 10*3/uL (ref 150–400)
PLATELETS: 268 10*3/uL (ref 150–400)
PLATELETS: 254 10*3/uL (ref 150–400)
Platelets: 249 10*3/uL (ref 150–400)
RBC: 3.92 MIL/uL — AB (ref 4.22–5.81)
RBC: 3.9 MIL/uL — AB (ref 4.22–5.81)
RBC: 4.19 MIL/uL — ABNORMAL LOW (ref 4.22–5.81)
RBC: 3.9 MIL/uL — AB (ref 4.22–5.81)
RDW: 14 % (ref 11.5–15.5)
RDW: 14.1 % (ref 11.5–15.5)
RDW: 13.9 % (ref 11.5–15.5)
RDW: 14 % (ref 11.5–15.5)
WBC: 7.2 10*3/uL (ref 4.0–10.5)
WBC: 7 10*3/uL (ref 4.0–10.5)
WBC: 9 10*3/uL (ref 4.0–10.5)
WBC: 7.8 10*3/uL (ref 4.0–10.5)

## 2015-02-26 LAB — BASIC METABOLIC PANEL
Anion gap: 6 (ref 5–15)
BUN: 11 mg/dL (ref 6–20)
CALCIUM: 9.4 mg/dL (ref 8.9–10.3)
CO2: 29 mmol/L (ref 22–32)
CREATININE: 1.01 mg/dL (ref 0.61–1.24)
Chloride: 106 mmol/L (ref 101–111)
GFR calc Af Amer: >60 mL/min (ref >60)
GLUCOSE: 124 mg/dL — AB (ref 65–99)
Potassium: 4.1 mmol/L (ref 3.5–5.1)
SODIUM: 141 mmol/L (ref 135–145)

## 2015-02-26 LAB — GLUCOSE, CAPILLARY: GLUCOSE-CAPILLARY: 108 mg/dL — AB (ref 65–99)

## 2015-02-26 MED ORDER — AMLODIPINE BESYLATE 10 MG PO TABS
10.0000 mg | ORAL_TABLET | Freq: Every day | ORAL | Status: DC
  Administered 2015-02-26 – 2015-02-27 (×2): 10 mg via ORAL
  Filled 2015-02-26 (×2): qty 1

## 2015-02-26 MED ORDER — ESOMEPRAZOLE MAGNESIUM 40 MG PO PACK: 40.0000 mg | PACK | Freq: Every day | ORAL | Status: DC

## 2015-02-26 MED ORDER — AMLODIPINE BESYLATE 10 MG PO TABS: 10.0000 mg | ORAL_TABLET | Freq: Every day | ORAL | Status: DC

## 2015-02-26 NOTE — Progress Notes (Signed)
OT Cancellation Note  Patient Details Name: Adrian Ellis MRN: [id number] DOB: 16-Sep-1940   Cancelled Treatment:    Reason Eval/Treat Not Completed: OT screened, no needs identified, will sign off. Pt reports he was independent with ADLs PTA, currently pt is at baseline. Pt was observed walking in the hallway with PT with supervision. Pt reports he has no questions or concerns for OT at this time. Signing off for OT at this time, please re-consult if change in medical status occurs. Thank you for this referral.   Binnie Kand M.S., OTR/L Pager: [phone]  02/26/2015, 10:02 AM

## 2015-02-26 NOTE — Progress Notes (Signed)
Pt did not feel comfortable being discharged without having a bowel movement.  Made MD aware.  MD fine with pt staying one more night.

## 2015-02-26 NOTE — Evaluation (Signed)
Physical Therapy Evaluation Patient Details Name: Adrian Ellis MRN: [id number] DOB: 08/15/1940 Today's Date: 02/26/2015   History of Present Illness  Pt is a 74 y.o. male with PMH of prostate cancer (s/p of radiation therapy), hypertension, hypothyroidism, depression, CKD-II colon polyps, s/p of polypectomy, who presents with rectal bleeding.  Clinical Impression  PT eval complete. Pt is independent with all functional mobility, ambulating in hallway without A.D. 750 feet. PT signing off.    Follow Up Recommendations No PT follow up    Equipment Recommendations  None recommended by PT    Recommendations for Other Services       Precautions / Restrictions Precautions Precautions: None      Mobility  Bed Mobility Overal bed mobility: Independent                Transfers Overall transfer level: Independent                  Ambulation/Gait Ambulation/Gait assistance: Independent Ambulation Distance (Feet): 750 Feet Assistive device: None Gait Pattern/deviations: WFL(Within Functional Limits) Gait velocity: WFL, moderate pace Gait velocity interpretation: >2.62 ft/sec, indicative of independent [employer] information [employer] manager [place] (Stroke Patients Only)       Balance                                             Pertinent Vitals/Pain Pain Assessment: No/denies pain    Home Living Family/patient expects to be discharged to:: Private residence Living Arrangements: Alone Available Help at Discharge: Family;Available PRN/intermittently Type of Home: House Home Access: Level entry     Home Layout: Two level Home Equipment: None      Prior Function Level of Independence: Independent               Hand Dominance        Extremity/Trunk Assessment   Upper Extremity Assessment: Overall WFL for tasks assessed           Lower Extremity Assessment: Overall WFL for  tasks assessed      Cervical / Trunk Assessment: Normal  Communication   Communication: No difficulties  Cognition Arousal/Alertness: Awake/alert Behavior During Therapy: WFL for tasks assessed/performed Overall Cognitive Status: Within Functional Limits for tasks assessed                      General Comments      Exercises        Assessment/Plan    PT Assessment Patent does not need any further PT services  PT Diagnosis Difficulty walking   PT Problem List    PT Treatment Interventions     PT Goals (Current goals can be found in the Care Plan section) Acute Rehab PT Goals Patient Stated Goal: home PT Goal Formulation: All assessment and education complete, DC therapy    Frequency     Barriers to discharge        Co-evaluation               End of Session Equipment Utilized During Treatment: Gait belt Activity Tolerance: Patient tolerated treatment well Patient left: in chair;with call bell/phone within reach;Other (comment) (with OT in room) Nurse Communication: Mobility status         Time: 8115-7262 PT Time Calculation (min) (  ACUTE ONLY): 12 min   Charges:   PT Evaluation $Initial PT Evaluation Tier I: 1 Procedure     PT G Codes:        Lorriane Shire 02/26/2015, 10:02 AM

## 2015-02-26 NOTE — Discharge Summary (Signed)
Physician Discharge Summary  Adrian Ellis MRN: [id number] DOB/AGE: 75/07/1940 75 y.o.  PCP: Maggie Font, MD   Admit date: 02/25/2015 Discharge date: 02/26/2015  Discharge Diagnoses:     Principal Problem:   Rectal bleeding Active Problems:   Hypertension   Malignant neoplasm of prostate ([place])   Hypothyroidism   Acute on chronic kidney failure (HCC)   AKI (acute kidney injury) ([place])   Bleeding gastrointestinal    Follow-up recommendations Follow-up with PCP in 3-5 days , including all  additional recommended appointments as below Follow-up CBC, CMP in 3-5 days Dr Benson Norway on 10/12     Medication List    STOP taking these medications        amlodipine-olmesartan 10-20 MG tablet  Commonly known as:  AZOR     aspirin EC 81 MG tablet      TAKE these medications        amLODipine 10 MG tablet  Commonly known as:  NORVASC  Take 1 tablet (10 mg total) by mouth daily.     dutasteride 0.5 MG capsule  Commonly known as:  AVODART  Take 0.5 mg by mouth daily.     esomeprazole 40 MG packet  Commonly known as:  NEXIUM  Take 40 mg by mouth daily before breakfast.     levothyroxine 112 MCG tablet  Commonly known as:  SYNTHROID, LEVOTHROID  Take 112 mcg by mouth daily.     multivitamin with minerals Tabs tablet  Take 1 tablet by mouth daily.     tamsulosin 0.4 MG Caps capsule  Commonly known as:  FLOMAX  Take 0.4 mg by mouth daily.         Discharge Condition:  Stable   Disposition: 01-Home or Self Care   Consults:  None    Significant Diagnostic Studies:  US Renal  02/26/2015   CLINICAL DATA:  Acute renal insufficiency  EXAM: RENAL / URINARY TRACT ULTRASOUND COMPLETE  COMPARISON:  CT abdomen and pelvis Sep 27, 2007  FINDINGS: Right Kidney:  Length: 11.1 cm. Echogenicity and renal cortical thickness are within normal limits. No mass, perinephric fluid, or hydronephrosis visualized. No sonographically demonstrable calculus or ureterectasis.  Left Kidney:   Length: 10.5 cm. Echogenicity and renal cortical thickness are within normal limits. No mass, perinephric fluid, or hydronephrosis visualized. No sonographically demonstrable calculus or ureterectasis.  Bladder:  Appears normal for degree of bladder distention.  IMPRESSION: Study within normal limits.   Electronically Signed   By: Lowella Grip III M.D.   On: 02/26/2015 09:00        Filed Weights   02/25/15 1520 02/25/15 2103  Weight: 98.657 kg (217 lb 8 oz) 97.1 kg (214 lb 1.1 oz)     Microbiology: No results found for this or any previous visit (from the past 240 hour(s)).     Blood Culture No results found for: SDES, [place], CULT, REPTSTATUS    Labs: Results for orders placed or performed during the hospital encounter of 02/25/15 (from the past 48 hour(s))  Type and screen     Status: None   Collection Time: 02/25/15  3:52 PM  Result Value Ref Range   ABO/RH(D) B POS    Antibody Screen NEG    Sample Expiration 02/28/2015   ABO/Rh     Status: None   Collection Time: 02/25/15  3:52 PM  Result Value Ref Range   ABO/RH(D) B POS   Comprehensive metabolic panel     Status: Abnormal   Collection Time: 02/25/15  4:12 PM  Result Value Ref Range   Sodium 137 135 - 145 mmol/L   Potassium 3.9 3.5 - 5.1 mmol/L   Chloride 102 101 - 111 mmol/L   CO2 25 22 - 32 mmol/L   Glucose, Bld 158 (H) 65 - 99 mg/dL   BUN 15 6 - 20 mg/dL   Creatinine, Ser 1.28 (H) 0.61 - 1.24 mg/dL   Calcium 9.5 8.9 - 10.3 mg/dL   Total Protein 6.5 6.5 - 8.1 g/dL   Albumin 3.7 3.5 - 5.0 g/dL   AST 32 15 - 41 U/L   ALT 28 17 - 63 U/L   Alkaline Phosphatase 65 38 - 126 U/L   Total Bilirubin 0.5 0.3 - 1.2 mg/dL   GFR calc non Af Amer 53 (L) >60 mL/min   GFR calc Af Amer >60 >60 mL/min    Comment: (NOTE) The eGFR has been calculated using the CKD EPI equation. This calculation has not been validated in all clinical situations. eGFR's persistently <60 mL/min signify possible Chronic  Kidney Disease.    Anion gap 10 5 - 15  CBC     Status: Abnormal   Collection Time: 02/25/15  4:12 PM  Result Value Ref Range   WBC 6.6 4.0 - 10.5 K/uL   RBC 3.65 (L) 4.22 - 5.81 MIL/uL   Hemoglobin 11.2 (L) 13.0 - 17.0 g/dL   HCT 33.5 (L) 39.0 - 52.0 %   MCV 91.8 78.0 - 100.0 fL   MCH 30.7 26.0 - 34.0 pg   MCHC 33.4 30.0 - 36.0 g/dL   RDW 13.9 11.5 - 15.5 %   Platelets 246 150 - 400 K/uL  POC occult blood, ED     Status: Abnormal   Collection Time: 02/25/15  4:24 PM  Result Value Ref Range   Fecal Occult Bld POSITIVE (A) NEGATIVE  Creatinine, urine, random     Status: None   Collection Time: 02/25/15  8:22 PM  Result Value Ref Range   Creatinine, Urine 64.58 mg/dL  Sodium, urine, random     Status: None   Collection Time: 02/25/15  8:22 PM  Result Value Ref Range   Sodium, Ur 99 mmol/L  CBC     Status: Abnormal   Collection Time: 02/25/15  9:33 PM  Result Value Ref Range   WBC 8.3 4.0 - 10.5 K/uL   RBC 4.02 (L) 4.22 - 5.81 MIL/uL   Hemoglobin 12.3 (L) 13.0 - 17.0 g/dL   HCT 37.1 (L) 39.0 - 52.0 %   MCV 92.3 78.0 - 100.0 fL   MCH 30.6 26.0 - 34.0 pg   MCHC 33.2 30.0 - 36.0 g/dL   RDW 13.9 11.5 - 15.5 %   Platelets 262 150 - 400 K/uL  Protime-INR     Status: None   Collection Time: 02/25/15  9:33 PM  Result Value Ref Range   Prothrombin Time 13.7 11.6 - 15.2 seconds   INR 1.03 0.00 - 1.49  APTT     Status: None   Collection Time: 02/25/15  9:33 PM  Result Value Ref Range   aPTT 30 24 - 37 seconds  CBC     Status: Abnormal   Collection Time: 02/26/15  1:30 AM  Result Value Ref Range   WBC 7.2 4.0 - 10.5 K/uL   RBC 3.92 (L) 4.22 - 5.81 MIL/uL   Hemoglobin 11.8 (L) 13.0 - 17.0 g/dL   HCT 35.8 (L) 39.0 - 52.0 %   MCV 91.3 78.0 -  100.0 fL   MCH 30.1 26.0 - 34.0 pg   MCHC 33.0 30.0 - 36.0 g/dL   RDW 14.0 11.5 - 15.5 %   Platelets 254 150 - 400 K/uL  CBC     Status: Abnormal   Collection Time: 02/26/15  7:16 AM  Result Value Ref Range   WBC 7.0 4.0 - 10.5 K/uL    RBC 3.90 (L) 4.22 - 5.81 MIL/uL   Hemoglobin 11.5 (L) 13.0 - 17.0 g/dL   HCT 35.7 (L) 39.0 - 52.0 %   MCV 91.5 78.0 - 100.0 fL   MCH 29.5 26.0 - 34.0 pg   MCHC 32.2 30.0 - 36.0 g/dL   RDW 14.1 11.5 - 15.5 %   Platelets 249 150 - 400 K/uL  Basic metabolic panel     Status: Abnormal   Collection Time: 02/26/15  7:16 AM  Result Value Ref Range   Sodium 141 135 - 145 mmol/L   Potassium 4.1 3.5 - 5.1 mmol/L   Chloride 106 101 - 111 mmol/L   CO2 29 22 - 32 mmol/L   Glucose, Bld 124 (H) 65 - 99 mg/dL   BUN 11 6 - 20 mg/dL   Creatinine, Ser 1.01 0.61 - 1.24 mg/dL   Calcium 9.4 8.9 - 10.3 mg/dL   GFR calc non Af Amer >60 >60 mL/min   GFR calc Af Amer >60 >60 mL/min    Comment: (NOTE) The eGFR has been calculated using the CKD EPI equation. This calculation has not been validated in all clinical situations. eGFR's persistently <60 mL/min signify possible Chronic Kidney Disease.    Anion gap 6 5 - 15  Glucose, capillary     Status: Abnormal   Collection Time: 02/26/15  7:33 AM  Result Value Ref Range   Glucose-Capillary 108 (H) 65 - 99 mg/dL  CBC     Status: Abnormal   Collection Time: 02/26/15  1:30 PM  Result Value Ref Range   WBC 9.0 4.0 - 10.5 K/uL   RBC 4.19 (L) 4.22 - 5.81 MIL/uL   Hemoglobin 12.4 (L) 13.0 - 17.0 g/dL   HCT 38.7 (L) 39.0 - 52.0 %   MCV 92.4 78.0 - 100.0 fL   MCH 29.6 26.0 - 34.0 pg   MCHC 32.0 30.0 - 36.0 g/dL   RDW 13.9 11.5 - 15.5 %   Platelets 268 150 - 400 K/uL     Lipid Panel  No results found for: CHOL, TRIG, HDL, CHOLHDL, VLDL, LDLCALC, LDLDIRECT   No results found for: HGBA1C   Lab Results  Component Value Date   CREATININE 1.01 02/26/2015     HPI :  74 y.o. Ellis with PMH of prostate cancer (s/p of radiation therapy), hypertension, hypothyroidism, depression, CKD-II colon polyps, s/p of polypectomy, who presents with rectal bleeding.  Patient reports that he has intermittent rectal bleeding for nearly a month. He was seen by GI,  Dr. Benson Norway 2 weeks ago, and had polypectomy. He has been doing well until today when he started having rectal bleeding again. He had 2 episode of rectal bleeding today. Patient does not have dizziness, lightheadedness, abdominal pain, nausea, vomiting, chest pain, shortness of breath. No diarrhea, symptoms of UTI or unilateral weakness..  In ED, patient was found to have positive FOBT, hemoglobin dropped from 13.2 on 04/24/13 to 11.2, WBC 6.6, temperature normal, no tachycardia, worsening renal function. Patient is admitted to inpatient for further evaluation and treatment.    HOSPITAL COURSE:   Rectal bleeding: Etiology  is not clear, potential differential diagnoses include bleeding from colon polyps and possible radiation-induced proctitis. Discussed with Dr Benson Norway , no bleeding overnight . patient stable for 24 hrs , will let patient go home and follow up with DR Benson Norway on 02/28/15.  Marland Kitchen Hemoglobin dropped from 13.2-->12.4.   Hypertension: - Switch Zaor to amlodipine due to AKI    Malignant neoplasm of prostate [facility]): Has been treated by oncologist, Dr. Percell Miller. Completed radiotherapy on 07/2014. -on Flomax  Acute on chronic kidney failure-II [facility]): Likely due to prerenal secondary to dehydration and continuation of ARB, 1.28> 1.01    Hypothyroidism: Last TSH was not on record. -Continue home Synthroid    Discharge Exam:    Blood pressure 114/42, pulse 73, temperature 97.8 F (36.6 C), temperature source Oral, resp. rate 16, height 6' (1.829 m), weight 97.1 kg (214 lb 1.1 oz), SpO2 99 %. General: Not in acute distress HEENT:  Eyes: PERRL, EOMI, no scleral icterus.  ENT: No discharge from the ears and nose, no pharynx injection, no tonsillar enlargement.   Neck: No JVD, no bruit, no mass felt. Heme: No neck lymph node enlargement. Cardiac: S1/S2, RRR, No murmurs, No gallops or rubs. Pulm: No rales, wheezing, rhonchi or rubs. Abd: Soft, nondistended, nontender, no rebound  pain, no organomegaly, BS present. Ext: No pitting leg edema bilaterally. 2+DP/PT pulse bilaterally. Musculoskeletal: No joint deformities, No joint redness or warmth, no limitation of ROM in spin. Skin: No rashes.  Neuro: Alert, oriented X3, cranial nerves II-XII grossly intact, muscle strength 5/5 in all extremities, sensation to light touch intact.  Psych: Patient is not psychotic, no suicidal or hemocidal ideation.        Discharge Instructions    Diet - low sodium heart healthy    Complete by:  As directed      Increase activity slowly    Complete by:  As directed            Follow-up Information    Follow up with HILL,GERALD K, MD. Schedule an appointment as soon as possible for a visit in 3 days.   Specialty:  Family Medicine   Contact information:   [address] [place] [address] [phone]       Follow up with HUNG,PATRICK D, MD. Schedule an appointment as soon as possible for a visit on 02/28/2015.   Specialty:  Gastroenterology   Why:  Please call Dr. Carol Ada before your appointment   Contact information:   [address], [place] [address] [phone]       Signed: Reyne Dumas 02/26/2015, 3:02 PM        Time spent >45 mins

## 2015-02-27 LAB — CBC
HCT: 33.2 % — ABNORMAL LOW (ref 39.0–52.0)
HEMATOCRIT: 35.3 % — AB (ref 39.0–52.0)
HEMOGLOBIN: 11.3 g/dL — AB (ref 13.0–17.0)
Hemoglobin: 11.4 g/dL — ABNORMAL LOW (ref 13.0–17.0)
MCH: 31.4 pg (ref 26.0–34.0)
MCH: 30.1 pg (ref 26.0–34.0)
MCHC: 34 g/dL (ref 30.0–36.0)
MCHC: 32.3 g/dL (ref 30.0–36.0)
MCV: 92.2 fL (ref 78.0–100.0)
MCV: 93.1 fL (ref 78.0–100.0)
PLATELETS: 229 10*3/uL (ref 150–400)
PLATELETS: 218 10*3/uL (ref 150–400)
RBC: 3.6 MIL/uL — ABNORMAL LOW (ref 4.22–5.81)
RBC: 3.79 MIL/uL — ABNORMAL LOW (ref 4.22–5.81)
RDW: 14 % (ref 11.5–15.5)
RDW: 14 % (ref 11.5–15.5)
WBC: 7.6 10*3/uL (ref 4.0–10.5)
WBC: 6.5 10*3/uL (ref 4.0–10.5)

## 2015-02-27 LAB — GLUCOSE, CAPILLARY: GLUCOSE-CAPILLARY: 104 mg/dL — AB (ref 65–99)

## 2015-02-27 MED ORDER — DOCUSATE SODIUM 100 MG PO CAPS
200.0000 mg | ORAL_CAPSULE | Freq: Two times a day (BID) | ORAL | Status: DC
  Administered 2015-02-27: 200 mg via ORAL
  Filled 2015-02-27: qty 2

## 2015-02-27 MED ORDER — POLYETHYLENE GLYCOL 3350 17 G PO PACK: 17.0000 g | PACK | Freq: Every day | ORAL | Status: DC

## 2015-02-27 MED ORDER — BISACODYL 10 MG RE SUPP
10.0000 mg | Freq: Once | RECTAL | Status: DC
  Filled 2015-02-27: qty 1

## 2015-02-27 MED ORDER — POLYETHYLENE GLYCOL 3350 17 G PO PACK
17.0000 g | PACK | Freq: Two times a day (BID) | ORAL | Status: DC
  Filled 2015-02-27: qty 1

## 2015-02-27 NOTE — Progress Notes (Signed)
AVS given to patient.  IV removed. Understanding of discharge instructions demonstrated by patient verbally.  Belongings packed and transportation arranged by patient.

## 2015-02-27 NOTE — Discharge Summary (Signed)
Physician Discharge Summary  Adrian Ellis MRN: [id number] DOB/AGE: 75-Dec-1942 75 y.o.  PCP: Maggie Font, MD   Admit date: 02/25/2015 Discharge date: 02/27/2015  Discharge Diagnoses:     Principal Problem:   Rectal bleeding Active Problems:   Hypertension   Malignant neoplasm of prostate ([place])   Hypothyroidism   Acute on chronic kidney failure (HCC)   AKI (acute kidney injury) ([place])   Bleeding gastrointestinal    Follow-up recommendations Follow-up with PCP in 3-5 days , including all  additional recommended appointments as below Follow-up CBC, CMP in 3-5 days Dr Benson Norway on 10/12     Medication List    STOP taking these medications        amlodipine-olmesartan 10-20 MG tablet  Commonly known as:  AZOR     aspirin EC 81 MG tablet      TAKE these medications        amLODipine 10 MG tablet  Commonly known as:  NORVASC  Take 1 tablet (10 mg total) by mouth daily.     dutasteride 0.5 MG capsule  Commonly known as:  AVODART  Take 0.5 mg by mouth daily.     esomeprazole 40 MG packet  Commonly known as:  NEXIUM  Take 40 mg by mouth daily before breakfast.     levothyroxine 112 MCG tablet  Commonly known as:  SYNTHROID, LEVOTHROID  Take 112 mcg by mouth daily.     multivitamin with minerals Tabs tablet  Take 1 tablet by mouth daily.     tamsulosin 0.4 MG Caps capsule  Commonly known as:  FLOMAX  Take 0.4 mg by mouth daily.         Discharge Condition:  Stable   Disposition: 01-Home or Self Care   Consults:  None    Significant Diagnostic Studies:  US Renal  02/26/2015   CLINICAL DATA:  Acute renal insufficiency  EXAM: RENAL / URINARY TRACT ULTRASOUND COMPLETE  COMPARISON:  CT abdomen and pelvis Sep 27, 2007  FINDINGS: Right Kidney:  Length: 11.1 cm. Echogenicity and renal cortical thickness are within normal limits. No mass, perinephric fluid, or hydronephrosis visualized. No sonographically demonstrable calculus or ureterectasis.  Left Kidney:   Length: 10.5 cm. Echogenicity and renal cortical thickness are within normal limits. No mass, perinephric fluid, or hydronephrosis visualized. No sonographically demonstrable calculus or ureterectasis.  Bladder:  Appears normal for degree of bladder distention.  IMPRESSION: Study within normal limits.   Electronically Signed   By: Lowella Grip III M.D.   On: 02/26/2015 09:00        Filed Weights   02/25/15 1520 02/25/15 2103  Weight: 98.657 kg (217 lb 8 oz) 97.1 kg (214 lb 1.1 oz)     Microbiology: No results found for this or any previous visit (from the past 240 hour(s)).     Blood Culture No results found for: SDES, [place], CULT, REPTSTATUS    Labs: Results for orders placed or performed during the hospital encounter of 02/25/15 (from the past 48 hour(s))  Type and screen     Status: None   Collection Time: 02/25/15  3:52 PM  Result Value Ref Range   ABO/RH(D) B POS    Antibody Screen NEG    Sample Expiration 02/28/2015   ABO/Rh     Status: None   Collection Time: 02/25/15  3:52 PM  Result Value Ref Range   ABO/RH(D) B POS   Comprehensive metabolic panel     Status: Abnormal   Collection Time: 02/25/15  4:12 PM  Result Value Ref Range   Sodium 137 135 - 145 mmol/L   Potassium 3.9 3.5 - 5.1 mmol/L   Chloride 102 101 - 111 mmol/L   CO2 25 22 - 32 mmol/L   Glucose, Bld 158 (H) 65 - 99 mg/dL   BUN 15 6 - 20 mg/dL   Creatinine, Ser 1.28 (H) 0.61 - 1.24 mg/dL   Calcium 9.5 8.9 - 10.3 mg/dL   Total Protein 6.5 6.5 - 8.1 g/dL   Albumin 3.7 3.5 - 5.0 g/dL   AST 32 15 - 41 U/L   ALT 28 17 - 63 U/L   Alkaline Phosphatase 65 38 - 126 U/L   Total Bilirubin 0.5 0.3 - 1.2 mg/dL   GFR calc non Af Amer 53 (L) >60 mL/min   GFR calc Af Amer >60 >60 mL/min    Comment: (NOTE) The eGFR has been calculated using the CKD EPI equation. This calculation has not been validated in all clinical situations. eGFR's persistently <60 mL/min signify possible Chronic  Kidney Disease.    Anion gap 10 5 - 15  CBC     Status: Abnormal   Collection Time: 02/25/15  4:12 PM  Result Value Ref Range   WBC 6.6 4.0 - 10.5 K/uL   RBC 3.65 (L) 4.22 - 5.81 MIL/uL   Hemoglobin 11.2 (L) 13.0 - 17.0 g/dL   HCT 33.5 (L) 39.0 - 52.0 %   MCV 91.8 78.0 - 100.0 fL   MCH 30.7 26.0 - 34.0 pg   MCHC 33.4 30.0 - 36.0 g/dL   RDW 13.9 11.5 - 15.5 %   Platelets 246 150 - 400 K/uL  POC occult blood, ED     Status: Abnormal   Collection Time: 02/25/15  4:24 PM  Result Value Ref Range   Fecal Occult Bld POSITIVE (A) NEGATIVE  Creatinine, urine, random     Status: None   Collection Time: 02/25/15  8:22 PM  Result Value Ref Range   Creatinine, Urine 64.58 mg/dL  Sodium, urine, random     Status: None   Collection Time: 02/25/15  8:22 PM  Result Value Ref Range   Sodium, Ur 99 mmol/L  CBC     Status: Abnormal   Collection Time: 02/25/15  9:33 PM  Result Value Ref Range   WBC 8.3 4.0 - 10.5 K/uL   RBC 4.02 (L) 4.22 - 5.81 MIL/uL   Hemoglobin 12.3 (L) 13.0 - 17.0 g/dL   HCT 37.1 (L) 39.0 - 52.0 %   MCV 92.3 78.0 - 100.0 fL   MCH 30.6 26.0 - 34.0 pg   MCHC 33.2 30.0 - 36.0 g/dL   RDW 13.9 11.5 - 15.5 %   Platelets 262 150 - 400 K/uL  Protime-INR     Status: None   Collection Time: 02/25/15  9:33 PM  Result Value Ref Range   Prothrombin Time 13.7 11.6 - 15.2 seconds   INR 1.03 0.00 - 1.49  APTT     Status: None   Collection Time: 02/25/15  9:33 PM  Result Value Ref Range   aPTT 30 24 - 37 seconds  CBC     Status: Abnormal   Collection Time: 02/26/15  1:30 AM  Result Value Ref Range   WBC 7.2 4.0 - 10.5 K/uL   RBC 3.92 (L) 4.22 - 5.81 MIL/uL   Hemoglobin 11.8 (L) 13.0 - 17.0 g/dL   HCT 35.8 (L) 39.0 - 52.0 %   MCV 91.3 78.0 -  100.0 fL   MCH 30.1 26.0 - 34.0 pg   MCHC 33.0 30.0 - 36.0 g/dL   RDW 14.0 11.5 - 15.5 %   Platelets 254 150 - 400 K/uL  CBC     Status: Abnormal   Collection Time: 02/26/15  7:16 AM  Result Value Ref Range   WBC 7.0 4.0 - 10.5 K/uL    RBC 3.90 (L) 4.22 - 5.81 MIL/uL   Hemoglobin 11.5 (L) 13.0 - 17.0 g/dL   HCT 35.7 (L) 39.0 - 52.0 %   MCV 91.5 78.0 - 100.0 fL   MCH 29.5 26.0 - 34.0 pg   MCHC 32.2 30.0 - 36.0 g/dL   RDW 14.1 11.5 - 15.5 %   Platelets 249 150 - 400 K/uL  Basic metabolic panel     Status: Abnormal   Collection Time: 02/26/15  7:16 AM  Result Value Ref Range   Sodium 141 135 - 145 mmol/L   Potassium 4.1 3.5 - 5.1 mmol/L   Chloride 106 101 - 111 mmol/L   CO2 29 22 - 32 mmol/L   Glucose, Bld 124 (H) 65 - 99 mg/dL   BUN 11 6 - 20 mg/dL   Creatinine, Ser 1.01 0.61 - 1.24 mg/dL   Calcium 9.4 8.9 - 10.3 mg/dL   GFR calc non Af Amer >60 >60 mL/min   GFR calc Af Amer >60 >60 mL/min    Comment: (NOTE) The eGFR has been calculated using the CKD EPI equation. This calculation has not been validated in all clinical situations. eGFR's persistently <60 mL/min signify possible Chronic Kidney Disease.    Anion gap 6 5 - 15  Glucose, capillary     Status: Abnormal   Collection Time: 02/26/15  7:33 AM  Result Value Ref Range   Glucose-Capillary 108 (H) 65 - 99 mg/dL  CBC     Status: Abnormal   Collection Time: 02/26/15  1:30 PM  Result Value Ref Range   WBC 9.0 4.0 - 10.5 K/uL   RBC 4.19 (L) 4.22 - 5.81 MIL/uL   Hemoglobin 12.4 (L) 13.0 - 17.0 g/dL   HCT 38.7 (L) 39.0 - 52.0 %   MCV 92.4 78.0 - 100.0 fL   MCH 29.6 26.0 - 34.0 pg   MCHC 32.0 30.0 - 36.0 g/dL   RDW 13.9 11.5 - 15.5 %   Platelets 268 150 - 400 K/uL  CBC     Status: Abnormal   Collection Time: 02/26/15  7:03 PM  Result Value Ref Range   WBC 7.8 4.0 - 10.5 K/uL   RBC 3.90 (L) 4.22 - 5.81 MIL/uL   Hemoglobin 11.7 (L) 13.0 - 17.0 g/dL   HCT 35.9 (L) 39.0 - 52.0 %   MCV 92.1 78.0 - 100.0 fL   MCH 30.0 26.0 - 34.0 pg   MCHC 32.6 30.0 - 36.0 g/dL   RDW 14.0 11.5 - 15.5 %   Platelets 254 150 - 400 K/uL  CBC     Status: Abnormal   Collection Time: 02/27/15  1:15 AM  Result Value Ref Range   WBC 7.6 4.0 - 10.5 K/uL   RBC 3.60 (L) 4.22  - 5.81 MIL/uL   Hemoglobin 11.3 (L) 13.0 - 17.0 g/dL   HCT 33.2 (L) 39.0 - 52.0 %   MCV 92.2 78.0 - 100.0 fL   MCH 31.4 26.0 - 34.0 pg   MCHC 34.0 30.0 - 36.0 g/dL   RDW 14.0 11.5 - 15.5 %   Platelets 229 150 -  400 K/uL  Glucose, capillary     Status: Abnormal   Collection Time: 02/27/15  7:24 AM  Result Value Ref Range   Glucose-Capillary 104 (H) 65 - 99 mg/dL     Lipid Panel  No results found for: CHOL, TRIG, HDL, CHOLHDL, VLDL, LDLCALC, LDLDIRECT   No results found for: HGBA1C   Lab Results  Component Value Date   CREATININE 1.01 02/26/2015     HPI :  75 y.o. male with PMH of prostate cancer (s/p of radiation therapy), hypertension, hypothyroidism, depression, CKD-II colon polyps, s/p of polypectomy, who presents with rectal bleeding.  Patient reports that he has intermittent rectal bleeding for nearly a month. He was seen by GI, Dr. Benson Norway 2 weeks ago, and had polypectomy. He has been doing well until today when he started having rectal bleeding again. He had 2 episode of rectal bleeding today. Patient does not have dizziness, lightheadedness, abdominal pain, nausea, vomiting, chest pain, shortness of breath. No diarrhea, symptoms of UTI or unilateral weakness..  In ED, patient was found to have positive FOBT, hemoglobin dropped from 13.2 on 04/24/13 to 11.2, WBC 6.6, temperature normal, no tachycardia, worsening renal function. Patient is admitted to inpatient for further evaluation and treatment.    HOSPITAL COURSE:   Rectal bleeding: Etiology is not clear, potential differential diagnoses include bleeding from colon polyps and possible radiation-induced proctitis. Discussed with Dr Benson Norway , no bleeding overnight . patient stable for 24 hrs , will let patient go home and follow up with DR Benson Norway on 02/28/15.  Marland Kitchen Hemoglobin dropped from 13.2-->12.4.   Hypertension: - Switch Zaor to amlodipine due to AKI    Malignant neoplasm of prostate [facility]): Has been treated by oncologist,  Dr. Percell Miller. Completed radiotherapy on 07/2014. -on Flomax  Acute on chronic kidney failure-II [facility]): Likely due to prerenal secondary to dehydration and continuation of ARB, 1.28> 1.01    Hypothyroidism: Last TSH was not on record. -Continue home Synthroid    Discharge Exam:    Blood pressure 150/54, pulse 62, temperature 97.8 F (36.6 C), temperature source Oral, resp. rate 16, height 6' (1.829 m), weight 97.1 kg (214 lb 1.1 oz), SpO2 100 %. General: Not in acute distress HEENT:  Eyes: PERRL, EOMI, no scleral icterus.  ENT: No discharge from the ears and nose, no pharynx injection, no tonsillar enlargement.   Neck: No JVD, no bruit, no mass felt. Heme: No neck lymph node enlargement. Cardiac: S1/S2, RRR, No murmurs, No gallops or rubs. Pulm: No rales, wheezing, rhonchi or rubs. Abd: Soft, nondistended, nontender, no rebound pain, no organomegaly, BS present. Ext: No pitting leg edema bilaterally. 2+DP/PT pulse bilaterally. Musculoskeletal: No joint deformities, No joint redness or warmth, no limitation of ROM in spin. Skin: No rashes.  Neuro: Alert, oriented X3, cranial nerves II-XII grossly intact, muscle strength 5/5 in all extremities, sensation to light touch intact.  Psych: Patient is not psychotic, no suicidal or hemocidal ideation.    Discharge Instructions    Diet - low sodium heart healthy    Complete by:  As directed      Increase activity slowly    Complete by:  As directed            Follow-up Information    Follow up with [facility], MD. Schedule an appointment as soon as possible for a visit on 02/28/2015.   Specialty:  Family Medicine   Why:  @ 11:45 A.M.    Contact information:   [address] [place] [address] [phone]       Follow up with Beryle Beams, MD. Schedule an appointment as soon as possible for a visit on 02/28/2015.   Specialty:  Gastroenterology   Why:  @ 2:45 P.M.   Contact information:   [place], [place] [address] [phone]       Signed: Reyne Dumas 02/27/2015, 10:29 AM        Time spent >45 mins

## 2015-02-28 DIAGNOSIS — Z6830 Body mass index (BMI) 30.0-30.9, adult: Secondary | ICD-10-CM | POA: Diagnosis not present

## 2015-02-28 DIAGNOSIS — K625 Hemorrhage of anus and rectum: Secondary | ICD-10-CM | POA: Diagnosis not present

## 2015-03-08 ENCOUNTER — Other Ambulatory Visit: Payer: Self-pay | Admitting: Gastroenterology

## 2015-03-08 DIAGNOSIS — K627 Radiation proctitis: Secondary | ICD-10-CM | POA: Diagnosis not present

## 2015-03-08 DIAGNOSIS — K921 Melena: Secondary | ICD-10-CM | POA: Diagnosis not present

## 2015-03-08 DIAGNOSIS — K625 Hemorrhage of anus and rectum: Secondary | ICD-10-CM | POA: Diagnosis not present

## 2015-03-09 ENCOUNTER — Ambulatory Visit (HOSPITAL_COMMUNITY)
Admission: RE | Admit: 2015-03-09 | Discharge: 2015-03-09 | Disposition: A | Discharge status: Home / Self Care | Payer: Medicare Other | Source: Ambulatory Visit | Attending: Gastroenterology | Admitting: Gastroenterology

## 2015-03-09 ENCOUNTER — Encounter (HOSPITAL_COMMUNITY)
Admission: RE | Disposition: A | Discharge status: Home / Self Care | Payer: Self-pay | Source: Ambulatory Visit | Attending: Gastroenterology

## 2015-03-09 ENCOUNTER — Encounter (HOSPITAL_COMMUNITY): Payer: Self-pay | Admitting: *Deleted

## 2015-03-09 DIAGNOSIS — Z87891 Personal history of nicotine dependence: Secondary | ICD-10-CM | POA: Insufficient documentation

## 2015-03-09 DIAGNOSIS — Z8546 Personal history of malignant neoplasm of prostate: Secondary | ICD-10-CM | POA: Insufficient documentation

## 2015-03-09 DIAGNOSIS — I129 Hypertensive chronic kidney disease with stage 1 through stage 4 chronic kidney disease, or unspecified chronic kidney disease: Secondary | ICD-10-CM | POA: Insufficient documentation

## 2015-03-09 DIAGNOSIS — N400 Enlarged prostate without lower urinary tract symptoms: Secondary | ICD-10-CM | POA: Insufficient documentation

## 2015-03-09 DIAGNOSIS — N182 Chronic kidney disease, stage 2 (mild): Secondary | ICD-10-CM | POA: Insufficient documentation

## 2015-03-09 DIAGNOSIS — E039 Hypothyroidism, unspecified: Secondary | ICD-10-CM | POA: Insufficient documentation

## 2015-03-09 DIAGNOSIS — K627 Radiation proctitis: Secondary | ICD-10-CM | POA: Diagnosis not present

## 2015-03-09 DIAGNOSIS — K625 Hemorrhage of anus and rectum: Secondary | ICD-10-CM | POA: Diagnosis not present

## 2015-03-09 HISTORY — PX: FLEXIBLE SIGMOIDOSCOPY: SHX5431

## 2015-03-09 SURGERY — SIGMOIDOSCOPY, FLEXIBLE

## 2015-03-09 NOTE — H&P (Signed)
  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  He complained of hematochezia after his colonoscopy in September.  Initially it was thought that he had some hemorrhoidal bleeding as the bleeding was not significant.  However, he was hospitalized for a couple of days a couple of weeks later.  His HGB was stable.  The decision was made to perform a FFS yesterday and a bleeding radiation proctitis was identified.  This was not apparent during the colonoscopy.  Past Medical History  Diagnosis Date  . Hypertension   . Thyroid disease   . Prostate enlargement   . Sinus problem   . Prostate cancer ([place]) 02/23/14    gleason 3+4=7, volume 25.98 cc  . Microhematuria     history of, work-up in 2009 negative  . Lower urinary tract symptoms (LUTS)   . Hypothyroidism   . CKD (chronic kidney disease), stage II     Past Surgical History  Procedure Laterality Date  . Tonsillectomy      age 23  . Prostate biopsy  02/23/14    Gleason 7, volume 25.98 cc    Family History  Problem Relation Age of Onset  . Aneurysm Mother   . Cancer Brother     Social History:  reports that he has quit smoking. He does not have any smokeless tobacco history on file. He reports that he does not drink alcohol or use illicit drugs.  Allergies: No Known Allergies  Medications: Scheduled: Continuous:  No results found for this or any previous visit (from the past 24 hour(s)).   No results found.  ROS:  As stated above in the HPI otherwise negative.  Blood pressure 125/43, pulse 77, temperature 97.8 F (36.6 C), temperature source Oral, resp. rate 15, height 6' (1.829 m), weight 97.07 kg (214 lb), SpO2 99 %.    PE: Gen: NAD, Alert and Oriented HEENT:  [employer]/AT, EOMI Neck: Supple, no LAD Lungs: CTA Bilaterally CV: RRR without M/G/R ABM: Soft, NTND, +BS Ext: No C/C/E  Assessment/Plan: 1) Radiation proctitis - FFS with APC.  Haeden Hudock D 03/09/2015, 7:44 AM

## 2015-03-09 NOTE — Op Note (Signed)
[facility] [place], [address]   FLEXIBLE SIGMOIDOSCOPY PROCEDURE REPORT  PATIENT: Gurkirat, Basher  MR#: [id number] BIRTHDATE: 09/16/1940 , 65  yrs. old GENDER: male ENDOSCOPIST: Carol Ada, MD REFERRED BY: PROCEDURE DATE:  03/09/2015 PROCEDURE:   Sigmoidoscopy with ablation therapy and control of bleeding ASA CLASS:   Class II INDICATIONS: Bleeding radiation procritis MEDICATIONS: None  DESCRIPTION OF PROCEDURE:   After the risks benefits and alternatives of the procedure were thoroughly explained, informed consent was obtained.  Digital exam revealed no abnormalities of the rectum. The     endoscope was introduced through the anus  and advanced to the rectum , The exam was Without limitations.    The quality of the prep was The overall prep quality was adequate. . Estimated blood loss is zero unless otherwise noted in this procedure report. The instrument was then slowly withdrawn as the mucosa was fully examined.       FINDINGS: Bleeding was noted upon entry into the rectum.  The radiation proctitis was again identified.  In order to prevent any potential for colonic gaseous ignition the distal colon was insufflated with CO2 and suctioned several cycles.  APC was then applied to the radiation proctitis and the vessels were ablated. No further bleeding was noted.          The scope was then withdrawn from the patient and the procedure terminated.  COMPLICATIONS: There were no immediate complications.  ENDOSCOPIC IMPRESSION: 1) Bleeding radiation proctitis s/p APC.  RECOMMENDATIONS: 1) Follow up in one month.  REPEAT EXAM:  eSigned:  Carol Ada, MD 03/09/2015 8:21 AM   CC:

## 2015-03-09 NOTE — Discharge Instructions (Signed)
Flexible Sigmoidoscopy, Care After  Refer to this sheet in the next few weeks. These instructions provide you with information on caring for yourself after your procedure. Your health care provider may also give you more specific instructions. Your treatment has been planned according to current medical practices, but problems sometimes occur. Call your health care provider if you have any problems or questions after your procedure.  WHAT TO EXPECT AFTER THE PROCEDURE  After your procedure, it is typical to have the following:   · Abdominal cramps.  · Bloating.  · A small amount of rectal bleeding if you had a biopsy.  HOME CARE INSTRUCTIONS  · Only take over-the-counter or prescription medicines for pain, fever, or discomfort as directed by your health care provider.  · Resume your normal diet and activities as directed by your health care provider.  SEEK MEDICAL CARE IF:  · You have abdominal pain or cramping that lasts longer than 1 hour after the procedure.  · You continue to have small amounts of rectal bleeding after 24 hours.  · You have nausea or vomiting.  · You feel weak or dizzy.  SEEK IMMEDIATE MEDICAL CARE IF:   · You have a fever.  · You pass large blood clots or see a large amount of blood in the toilet after having a bowel movement. This may also occur 10-14 days after the procedure. It is more likely if you had a biopsy.  · You develop abdominal pain that is not relieved with medicine or your abdominal pain gets worse.  · You have nausea or vomiting for more than 24 hours after the procedure.     This information is not intended to replace advice given to you by your health care provider. Make sure you discuss any questions you have with your health care provider.     Document Released: 05/10/2013 Document Reviewed: 05/10/2013  Elsevier Interactive Patient Education ©2016 [employer]

## 2015-03-12 ENCOUNTER — Encounter (HOSPITAL_COMMUNITY): Payer: Self-pay | Admitting: Gastroenterology

## 2015-03-19 DIAGNOSIS — K625 Hemorrhage of anus and rectum: Secondary | ICD-10-CM | POA: Diagnosis not present

## 2015-03-29 DIAGNOSIS — H2512 Age-related nuclear cataract, left eye: Secondary | ICD-10-CM | POA: Diagnosis not present

## 2015-03-29 DIAGNOSIS — H269 Unspecified cataract: Secondary | ICD-10-CM | POA: Diagnosis not present

## 2015-04-04 DIAGNOSIS — K625 Hemorrhage of anus and rectum: Secondary | ICD-10-CM | POA: Diagnosis not present

## 2015-04-04 DIAGNOSIS — K627 Radiation proctitis: Secondary | ICD-10-CM | POA: Diagnosis not present

## 2015-04-30 DIAGNOSIS — S80811A Abrasion, right lower leg, initial encounter: Secondary | ICD-10-CM | POA: Diagnosis not present

## 2015-05-01 DIAGNOSIS — E039 Hypothyroidism, unspecified: Secondary | ICD-10-CM | POA: Diagnosis not present

## 2015-05-01 DIAGNOSIS — I10 Essential (primary) hypertension: Secondary | ICD-10-CM | POA: Diagnosis not present

## 2015-06-13 DIAGNOSIS — N139 Obstructive and reflux uropathy, unspecified: Secondary | ICD-10-CM | POA: Diagnosis not present

## 2015-06-13 DIAGNOSIS — Z0000 Encounter for general adult medical examination without abnormal findings: Secondary | ICD-10-CM | POA: Diagnosis not present

## 2015-06-13 DIAGNOSIS — N401 Enlarged prostate with lower urinary tract symptoms: Secondary | ICD-10-CM | POA: Diagnosis not present

## 2015-06-13 DIAGNOSIS — C61 Malignant neoplasm of prostate: Secondary | ICD-10-CM | POA: Diagnosis not present

## 2015-06-17 DIAGNOSIS — H2511 Age-related nuclear cataract, right eye: Secondary | ICD-10-CM | POA: Diagnosis not present

## 2015-06-21 DIAGNOSIS — H2511 Age-related nuclear cataract, right eye: Secondary | ICD-10-CM | POA: Diagnosis not present

## 2015-06-21 DIAGNOSIS — H268 Other specified cataract: Secondary | ICD-10-CM | POA: Diagnosis not present

## 2015-07-30 DIAGNOSIS — E039 Hypothyroidism, unspecified: Secondary | ICD-10-CM | POA: Diagnosis not present

## 2015-07-30 DIAGNOSIS — I10 Essential (primary) hypertension: Secondary | ICD-10-CM | POA: Diagnosis not present

## 2015-09-04 DIAGNOSIS — J209 Acute bronchitis, unspecified: Secondary | ICD-10-CM | POA: Diagnosis not present

## 2015-09-04 DIAGNOSIS — Z6830 Body mass index (BMI) 30.0-30.9, adult: Secondary | ICD-10-CM | POA: Diagnosis not present

## 2015-09-11 DIAGNOSIS — J209 Acute bronchitis, unspecified: Secondary | ICD-10-CM | POA: Diagnosis not present

## 2015-10-09 DIAGNOSIS — C61 Malignant neoplasm of prostate: Secondary | ICD-10-CM | POA: Diagnosis not present

## 2015-10-16 DIAGNOSIS — Z0000 Encounter for general adult medical examination without abnormal findings: Secondary | ICD-10-CM | POA: Diagnosis not present

## 2015-10-16 DIAGNOSIS — C61 Malignant neoplasm of prostate: Secondary | ICD-10-CM | POA: Diagnosis not present

## 2015-10-16 DIAGNOSIS — N401 Enlarged prostate with lower urinary tract symptoms: Secondary | ICD-10-CM | POA: Diagnosis not present

## 2015-10-29 DIAGNOSIS — I10 Essential (primary) hypertension: Secondary | ICD-10-CM | POA: Diagnosis not present

## 2016-01-23 DIAGNOSIS — I10 Essential (primary) hypertension: Secondary | ICD-10-CM | POA: Diagnosis not present

## 2016-01-23 DIAGNOSIS — E039 Hypothyroidism, unspecified: Secondary | ICD-10-CM | POA: Diagnosis not present

## 2016-01-23 DIAGNOSIS — Z23 Encounter for immunization: Secondary | ICD-10-CM | POA: Diagnosis not present

## 2016-01-29 DIAGNOSIS — I10 Essential (primary) hypertension: Secondary | ICD-10-CM | POA: Diagnosis not present

## 2016-01-29 DIAGNOSIS — Z0000 Encounter for general adult medical examination without abnormal findings: Secondary | ICD-10-CM | POA: Diagnosis not present

## 2016-02-12 DIAGNOSIS — C61 Malignant neoplasm of prostate: Secondary | ICD-10-CM | POA: Diagnosis not present

## 2016-02-13 DIAGNOSIS — Z961 Presence of intraocular lens: Secondary | ICD-10-CM | POA: Diagnosis not present

## 2016-02-13 DIAGNOSIS — H35371 Puckering of macula, right eye: Secondary | ICD-10-CM | POA: Diagnosis not present

## 2016-02-19 DIAGNOSIS — N400 Enlarged prostate without lower urinary tract symptoms: Secondary | ICD-10-CM | POA: Diagnosis not present

## 2016-02-19 DIAGNOSIS — C61 Malignant neoplasm of prostate: Secondary | ICD-10-CM | POA: Diagnosis not present

## 2016-03-28 ENCOUNTER — Emergency Department (HOSPITAL_COMMUNITY)
Admission: EM | Admit: 2016-03-28 | Discharge: 2016-03-29 | Disposition: A | Discharge status: Home / Self Care | Payer: Medicare Other | Attending: Emergency Medicine | Admitting: Emergency Medicine

## 2016-03-28 ENCOUNTER — Encounter (HOSPITAL_COMMUNITY): Payer: Self-pay

## 2016-03-28 DIAGNOSIS — Z87891 Personal history of nicotine dependence: Secondary | ICD-10-CM | POA: Insufficient documentation

## 2016-03-28 DIAGNOSIS — E039 Hypothyroidism, unspecified: Secondary | ICD-10-CM | POA: Diagnosis not present

## 2016-03-28 DIAGNOSIS — Y999 Unspecified external cause status: Secondary | ICD-10-CM | POA: Insufficient documentation

## 2016-03-28 DIAGNOSIS — I129 Hypertensive chronic kidney disease with stage 1 through stage 4 chronic kidney disease, or unspecified chronic kidney disease: Secondary | ICD-10-CM | POA: Insufficient documentation

## 2016-03-28 DIAGNOSIS — Y9301 Activity, walking, marching and hiking: Secondary | ICD-10-CM | POA: Diagnosis not present

## 2016-03-28 DIAGNOSIS — S51012A Laceration without foreign body of left elbow, initial encounter: Secondary | ICD-10-CM | POA: Diagnosis not present

## 2016-03-28 DIAGNOSIS — Y929 Unspecified place or not applicable: Secondary | ICD-10-CM | POA: Insufficient documentation

## 2016-03-28 DIAGNOSIS — N182 Chronic kidney disease, stage 2 (mild): Secondary | ICD-10-CM | POA: Diagnosis not present

## 2016-03-28 DIAGNOSIS — W01190A Fall on same level from slipping, tripping and stumbling with subsequent striking against furniture, initial encounter: Secondary | ICD-10-CM | POA: Diagnosis not present

## 2016-03-28 DIAGNOSIS — Z23 Encounter for immunization: Secondary | ICD-10-CM | POA: Insufficient documentation

## 2016-03-28 DIAGNOSIS — Z8546 Personal history of malignant neoplasm of prostate: Secondary | ICD-10-CM | POA: Diagnosis not present

## 2016-03-28 NOTE — ED Triage Notes (Signed)
Pt states walking up steps, tripped and fell onto table. Pt with lac to L posterior elbow. Bleeding controlled at triage.

## 2016-03-29 MED ORDER — TETANUS-DIPHTH-ACELL PERTUSSIS 5-2.5-18.5 LF-MCG/0.5 IM SUSP
0.5000 mL | Freq: Once | INTRAMUSCULAR | Status: AC
  Administered 2016-03-29: 0.5 mL via INTRAMUSCULAR
  Filled 2016-03-29: qty 0.5

## 2016-03-29 MED ORDER — LIDOCAINE-EPINEPHRINE 1 %-1:100000 IJ SOLN
10.0000 mL | Freq: Once | INTRAMUSCULAR | Status: AC
  Administered 2016-03-29: 10 mL via INTRADERMAL
  Filled 2016-03-29: qty 1

## 2016-03-29 NOTE — ED Notes (Signed)
Pt departed in NAD, refused use of wheelchair.  

## 2016-03-29 NOTE — ED Provider Notes (Signed)
[facility] DEPT Provider Note   CSN: [id number] Arrival date & time: 03/28/16  2226  By signing my name below, I, Irene Pap, attest that this documentation has been prepared under the direction and in the presence of Leo Grosser, MD. Electronically Signed: Irene Pap, ED Scribe. 03/29/16. 12:12 AM.  History   Chief Complaint Chief Complaint  Patient presents with  . Extremity Laceration   The history is provided by the patient. No language interpreter was used.   HPI Comments: Mikle Deneault is a 75 y.o. Male with a hx of CKD, HTN, prostate cancer, microhematuria, and rectal bleeding who presents to the Emergency Department complaining of a laceration to the left posterior elbow occurring earlier this evening. Pt says that he was walking up some stairs, but tripped and hit a table. Bleeding is currently controlled. Pt notes pain to the left hip and groin area, but says he was able to walk after the fall. He is not utd on tdap. He denies hitting head, LOC, color change, joint swelling, numbness, or weakness.   Past Medical History:  Diagnosis Date  . CKD (chronic kidney disease), stage II   . Hypertension   . Hypothyroidism   . Lower urinary tract symptoms (LUTS)   . Microhematuria    history of, work-up in 2009 negative  . Prostate cancer ([place]) 02/23/14   gleason 3+4=7, volume 25.98 cc  . Prostate enlargement   . Sinus problem   . Thyroid disease     Patient Active Problem List   Diagnosis Date Noted  . AKI (acute kidney injury) ([place])   . Bleeding gastrointestinal   . Rectal bleeding 02/25/2015  . Acute on chronic kidney failure ([place]) 02/25/2015  . Hypothyroidism   . Thyroid activity decreased   . Malignant neoplasm of prostate ([place]) 03/09/2014  . Syncope 04/23/2013  . Hypertension 04/23/2013  . Prostate enlargement     Past Surgical History:  Procedure Laterality Date  . FLEXIBLE SIGMOIDOSCOPY N/A 03/09/2015   Procedure: FLEXIBLE SIGMOIDOSCOPY;   Surgeon: Carol Ada, MD;  Location: [facility] ENDOSCOPY;  Service: Endoscopy;  Laterality: N/A;  . PROSTATE BIOPSY  02/23/14   Gleason 7, volume 25.98 cc  . TONSILLECTOMY     age 75       Home Medications    Prior to Admission medications   Medication Sig Start Date End Date Taking? Authorizing Provider  amLODipine (NORVASC) 10 MG tablet Take 1 tablet (10 mg total) by mouth daily. 02/26/15   Reyne Dumas, MD  dutasteride (AVODART) 0.5 MG capsule Take 0.5 mg by mouth daily.    Historical Provider, MD  esomeprazole (NEXIUM) 40 MG packet Take 40 mg by mouth daily before breakfast. 02/26/15   Reyne Dumas, MD  levothyroxine (SYNTHROID, LEVOTHROID) 112 MCG tablet Take 112 mcg by mouth daily.    Historical Provider, MD  Multiple Vitamin (MULTIVITAMIN WITH MINERALS) TABS Take 1 tablet by mouth daily.    Historical Provider, MD  tamsulosin (FLOMAX) 0.4 MG CAPS capsule Take 0.4 mg by mouth daily.    Historical Provider, MD    Family History Family History  Problem Relation Age of Onset  . Aneurysm Mother   . Cancer Brother     Social History Social History  Substance Use Topics  . Smoking status: Former Smoker    Packs/day: 0.25  . Smokeless tobacco: Never Used     Comment: quit many years ago  . Alcohol use No     Allergies   Patient has no known  allergies.   Review of Systems Review of Systems  Musculoskeletal: Positive for arthralgias. Negative for joint swelling.  Skin: Positive for wound. Negative for color change.  Neurological: Negative for syncope, weakness and numbness.   Physical Exam Updated Vital Signs BP 134/67 (BP Location: Right Arm)   Pulse 106   Temp 98.4 F (36.9 C) (Oral)   Resp 18   Ht 6' (1.829 m)   Wt 218 lb (98.9 kg)   SpO2 98%   BMI 29.57 kg/m   Physical Exam  Constitutional: He is oriented to person, place, and time. He appears well-developed and well-nourished. No distress.  HENT:  Head: Normocephalic and atraumatic.  Nose: Nose normal.    Eyes: Conjunctivae are normal.  Neck: Neck supple. No tracheal deviation present.  Cardiovascular: Normal rate and regular rhythm.   Pulmonary/Chest: Effort normal. No respiratory distress.  Abdominal: Soft. He exhibits no distension.  Musculoskeletal:  3 cm laceration over posterior left elbow through the dermis that is hemostatic  Neurological: He is alert and oriented to person, place, and time.  Skin: Skin is warm and dry.  Psychiatric: He has a normal mood and affect.   ED Treatments / Results  DIAGNOSTIC STUDIES: Oxygen Saturation is 98% on RA, normal by my interpretation.    COORDINATION OF CARE: 12:12 AM-Discussed treatment plan which includes laceration repair with pt at bedside and pt agreed to plan.    Labs (all labs ordered are listed, but only abnormal results are displayed) Labs Reviewed - No data to display  EKG  EKG Interpretation None       Radiology No results found.  Procedures Procedures (including critical care time)  LACERATION REPAIR Performed by: Leo Grosser Authorized by: Leo Grosser Consent: Verbal consent obtained. Risks and benefits: risks, benefits and alternatives were discussed Consent given by: patient Patient identity confirmed: provided demographic data Prepped and Draped in normal sterile fashion Wound explored  Laceration Location: left elbow  Laceration Length: 3 cm  No Foreign Bodies seen or palpated  Anesthesia: local infiltration  Local anesthetic: lidocaine 2 % w epinephrine  Anesthetic total: 5 ml  Irrigation method: syringe Amount of cleaning: standard  Skin closure: 4-0 prolene  Number of sutures: 5  Technique: simple interrupted  Patient tolerance: Patient tolerated the procedure well with no immediate complications.  Medications Ordered in ED Medications  lidocaine-EPINEPHrine (XYLOCAINE W/EPI) 2 %-1:200000 (PF) injection 10 mL (not administered)     Initial Impression / Assessment and Plan  / ED Course  I have reviewed the triage vital signs and the nursing notes.  Pertinent labs & imaging results that were available during my care of the patient were reviewed by me and considered in my medical decision making (see chart for details).  Clinical Course    75 year old male presents after a mechanical fall while walking up stairs sustaining a laceration to his left elbow which appears slightly gaping. No other complaints. Wound is hemostatic on arrival.  Laceration was irrigated, repaired primarily with good approximation as documented in procedure portion of note. No evidence of foreign body or non-viable tissue involvement in approximation. Pt counseled on proper management of closed wound and will return for suture removal.   Final Clinical Impressions(s) / ED Diagnoses   Final diagnoses:  Elbow laceration, left, initial encounter   I personally performed the services described in this documentation, which was scribed in my presence. The recorded information has been reviewed and is accurate.   New Prescriptions New Prescriptions  No medications on file     Leo Grosser, MD 03/29/16 [id number]

## 2016-04-07 DIAGNOSIS — S51012A Laceration without foreign body of left elbow, initial encounter: Secondary | ICD-10-CM | POA: Diagnosis not present

## 2016-05-03 DIAGNOSIS — S51012D Laceration without foreign body of left elbow, subsequent encounter: Secondary | ICD-10-CM | POA: Diagnosis not present

## 2016-07-16 DIAGNOSIS — E039 Hypothyroidism, unspecified: Secondary | ICD-10-CM | POA: Diagnosis not present

## 2016-07-16 DIAGNOSIS — I10 Essential (primary) hypertension: Secondary | ICD-10-CM | POA: Diagnosis not present

## 2016-07-30 ENCOUNTER — Other Ambulatory Visit: Payer: Self-pay | Admitting: Family Medicine

## 2016-07-30 ENCOUNTER — Ambulatory Visit
Admission: RE | Admit: 2016-07-30 | Discharge: 2016-07-30 | Disposition: A | Discharge status: Home / Self Care | Payer: Medicare Other | Source: Ambulatory Visit | Attending: Family Medicine | Admitting: Family Medicine

## 2016-07-30 DIAGNOSIS — M19012 Primary osteoarthritis, left shoulder: Secondary | ICD-10-CM | POA: Diagnosis not present

## 2016-07-30 DIAGNOSIS — R52 Pain, unspecified: Secondary | ICD-10-CM

## 2016-07-31 DIAGNOSIS — M25512 Pain in left shoulder: Secondary | ICD-10-CM | POA: Diagnosis not present

## 2016-07-31 DIAGNOSIS — I10 Essential (primary) hypertension: Secondary | ICD-10-CM | POA: Diagnosis not present

## 2016-08-20 DIAGNOSIS — C61 Malignant neoplasm of prostate: Secondary | ICD-10-CM | POA: Diagnosis not present

## 2016-08-27 DIAGNOSIS — N139 Obstructive and reflux uropathy, unspecified: Secondary | ICD-10-CM | POA: Diagnosis not present

## 2016-08-27 DIAGNOSIS — N401 Enlarged prostate with lower urinary tract symptoms: Secondary | ICD-10-CM | POA: Diagnosis not present

## 2016-08-27 DIAGNOSIS — C61 Malignant neoplasm of prostate: Secondary | ICD-10-CM | POA: Diagnosis not present

## 2016-09-23 DIAGNOSIS — I10 Essential (primary) hypertension: Secondary | ICD-10-CM | POA: Diagnosis not present

## 2016-09-23 DIAGNOSIS — B350 Tinea barbae and tinea capitis: Secondary | ICD-10-CM | POA: Diagnosis not present

## 2016-09-23 DIAGNOSIS — M25511 Pain in right shoulder: Secondary | ICD-10-CM | POA: Diagnosis not present

## 2016-10-07 DIAGNOSIS — B353 Tinea pedis: Secondary | ICD-10-CM | POA: Diagnosis not present

## 2016-10-07 DIAGNOSIS — I10 Essential (primary) hypertension: Secondary | ICD-10-CM | POA: Diagnosis not present

## 2016-10-07 DIAGNOSIS — M25511 Pain in right shoulder: Secondary | ICD-10-CM | POA: Diagnosis not present

## 2016-10-08 ENCOUNTER — Other Ambulatory Visit: Payer: Self-pay | Admitting: Family Medicine

## 2016-10-08 ENCOUNTER — Ambulatory Visit
Admission: RE | Admit: 2016-10-08 | Discharge: 2016-10-08 | Disposition: A | Discharge status: Home / Self Care | Payer: Medicare Other | Source: Ambulatory Visit | Attending: Family Medicine | Admitting: Family Medicine

## 2016-10-08 DIAGNOSIS — M25511 Pain in right shoulder: Principal | ICD-10-CM

## 2016-10-08 DIAGNOSIS — G8929 Other chronic pain: Secondary | ICD-10-CM

## 2016-10-08 DIAGNOSIS — M19011 Primary osteoarthritis, right shoulder: Secondary | ICD-10-CM | POA: Diagnosis not present

## 2016-11-24 DIAGNOSIS — B353 Tinea pedis: Secondary | ICD-10-CM | POA: Diagnosis not present

## 2016-11-24 DIAGNOSIS — M25511 Pain in right shoulder: Secondary | ICD-10-CM | POA: Diagnosis not present

## 2016-12-22 DIAGNOSIS — E039 Hypothyroidism, unspecified: Secondary | ICD-10-CM | POA: Diagnosis not present

## 2016-12-22 DIAGNOSIS — I10 Essential (primary) hypertension: Secondary | ICD-10-CM | POA: Diagnosis not present

## 2016-12-22 DIAGNOSIS — B353 Tinea pedis: Secondary | ICD-10-CM | POA: Diagnosis not present

## 2016-12-23 DIAGNOSIS — E039 Hypothyroidism, unspecified: Secondary | ICD-10-CM | POA: Diagnosis not present

## 2016-12-23 DIAGNOSIS — I10 Essential (primary) hypertension: Secondary | ICD-10-CM | POA: Diagnosis not present

## 2017-01-26 DIAGNOSIS — B353 Tinea pedis: Secondary | ICD-10-CM | POA: Diagnosis not present

## 2017-02-13 DIAGNOSIS — H35371 Puckering of macula, right eye: Secondary | ICD-10-CM | POA: Diagnosis not present

## 2017-02-13 DIAGNOSIS — Z961 Presence of intraocular lens: Secondary | ICD-10-CM | POA: Diagnosis not present

## 2017-02-23 DIAGNOSIS — B353 Tinea pedis: Secondary | ICD-10-CM | POA: Diagnosis not present

## 2017-02-23 DIAGNOSIS — Z23 Encounter for immunization: Secondary | ICD-10-CM | POA: Diagnosis not present

## 2017-02-24 DIAGNOSIS — Z961 Presence of intraocular lens: Secondary | ICD-10-CM | POA: Diagnosis not present

## 2017-02-25 ENCOUNTER — Encounter (HOSPITAL_COMMUNITY): Payer: Self-pay | Admitting: Emergency Medicine

## 2017-02-25 ENCOUNTER — Inpatient Hospital Stay (HOSPITAL_COMMUNITY)
Admission: EM | Admit: 2017-02-25 | Discharge: 2017-03-03 | DRG: 378 | Disposition: A | Discharge status: Home / Self Care | Payer: Medicare Other | Attending: Nephrology | Admitting: Nephrology

## 2017-02-25 DIAGNOSIS — N179 Acute kidney failure, unspecified: Secondary | ICD-10-CM | POA: Diagnosis present

## 2017-02-25 DIAGNOSIS — Z9842 Cataract extraction status, left eye: Secondary | ICD-10-CM

## 2017-02-25 DIAGNOSIS — K219 Gastro-esophageal reflux disease without esophagitis: Secondary | ICD-10-CM | POA: Diagnosis not present

## 2017-02-25 DIAGNOSIS — Z923 Personal history of irradiation: Secondary | ICD-10-CM

## 2017-02-25 DIAGNOSIS — K449 Diaphragmatic hernia without obstruction or gangrene: Secondary | ICD-10-CM | POA: Diagnosis present

## 2017-02-25 DIAGNOSIS — K922 Gastrointestinal hemorrhage, unspecified: Secondary | ICD-10-CM | POA: Diagnosis not present

## 2017-02-25 DIAGNOSIS — Z961 Presence of intraocular lens: Secondary | ICD-10-CM | POA: Diagnosis present

## 2017-02-25 DIAGNOSIS — I700 Atherosclerosis of aorta: Secondary | ICD-10-CM | POA: Diagnosis not present

## 2017-02-25 DIAGNOSIS — D509 Iron deficiency anemia, unspecified: Secondary | ICD-10-CM | POA: Diagnosis not present

## 2017-02-25 DIAGNOSIS — Z87891 Personal history of nicotine dependence: Secondary | ICD-10-CM

## 2017-02-25 DIAGNOSIS — Z809 Family history of malignant neoplasm, unspecified: Secondary | ICD-10-CM | POA: Diagnosis not present

## 2017-02-25 DIAGNOSIS — I774 Celiac artery compression syndrome: Secondary | ICD-10-CM | POA: Diagnosis not present

## 2017-02-25 DIAGNOSIS — K625 Hemorrhage of anus and rectum: Secondary | ICD-10-CM

## 2017-02-25 DIAGNOSIS — K551 Chronic vascular disorders of intestine: Secondary | ICD-10-CM | POA: Diagnosis not present

## 2017-02-25 DIAGNOSIS — Z79899 Other long term (current) drug therapy: Secondary | ICD-10-CM | POA: Diagnosis not present

## 2017-02-25 DIAGNOSIS — N400 Enlarged prostate without lower urinary tract symptoms: Secondary | ICD-10-CM | POA: Diagnosis not present

## 2017-02-25 DIAGNOSIS — F329 Major depressive disorder, single episode, unspecified: Secondary | ICD-10-CM | POA: Diagnosis present

## 2017-02-25 DIAGNOSIS — Z7982 Long term (current) use of aspirin: Secondary | ICD-10-CM | POA: Diagnosis not present

## 2017-02-25 DIAGNOSIS — Z9841 Cataract extraction status, right eye: Secondary | ICD-10-CM | POA: Diagnosis not present

## 2017-02-25 DIAGNOSIS — K5791 Diverticulosis of intestine, part unspecified, without perforation or abscess with bleeding: Secondary | ICD-10-CM | POA: Diagnosis not present

## 2017-02-25 DIAGNOSIS — E079 Disorder of thyroid, unspecified: Secondary | ICD-10-CM | POA: Diagnosis not present

## 2017-02-25 DIAGNOSIS — I2510 Atherosclerotic heart disease of native coronary artery without angina pectoris: Secondary | ICD-10-CM | POA: Diagnosis not present

## 2017-02-25 DIAGNOSIS — I129 Hypertensive chronic kidney disease with stage 1 through stage 4 chronic kidney disease, or unspecified chronic kidney disease: Secondary | ICD-10-CM | POA: Diagnosis not present

## 2017-02-25 DIAGNOSIS — N189 Chronic kidney disease, unspecified: Secondary | ICD-10-CM

## 2017-02-25 DIAGNOSIS — D62 Acute posthemorrhagic anemia: Secondary | ICD-10-CM | POA: Diagnosis not present

## 2017-02-25 DIAGNOSIS — I10 Essential (primary) hypertension: Secondary | ICD-10-CM | POA: Diagnosis not present

## 2017-02-25 DIAGNOSIS — N183 Chronic kidney disease, stage 3 (moderate): Secondary | ICD-10-CM | POA: Diagnosis not present

## 2017-02-25 DIAGNOSIS — Z8546 Personal history of malignant neoplasm of prostate: Secondary | ICD-10-CM

## 2017-02-25 DIAGNOSIS — I771 Stricture of artery: Secondary | ICD-10-CM

## 2017-02-25 DIAGNOSIS — K627 Radiation proctitis: Secondary | ICD-10-CM | POA: Diagnosis not present

## 2017-02-25 DIAGNOSIS — E039 Hypothyroidism, unspecified: Secondary | ICD-10-CM | POA: Diagnosis present

## 2017-02-25 DIAGNOSIS — C61 Malignant neoplasm of prostate: Secondary | ICD-10-CM | POA: Diagnosis not present

## 2017-02-25 DIAGNOSIS — Z6828 Body mass index (BMI) 28.0-28.9, adult: Secondary | ICD-10-CM

## 2017-02-25 HISTORY — DX: Unspecified osteoarthritis, unspecified site: M19.90

## 2017-02-25 HISTORY — DX: Gastro-esophageal reflux disease without esophagitis: K21.9

## 2017-02-25 LAB — CBC
HCT: 36.9 % — ABNORMAL LOW (ref 39.0–52.0)
HEMATOCRIT: 32.9 % — AB (ref 39.0–52.0)
HEMOGLOBIN: 11.9 g/dL — AB (ref 13.0–17.0)
Hemoglobin: 10.8 g/dL — ABNORMAL LOW (ref 13.0–17.0)
MCH: 29.8 pg (ref 26.0–34.0)
MCH: 30.3 pg (ref 26.0–34.0)
MCHC: 32.2 g/dL (ref 30.0–36.0)
MCHC: 32.8 g/dL (ref 30.0–36.0)
MCV: 92.3 fL (ref 78.0–100.0)
MCV: 92.4 fL (ref 78.0–100.0)
PLATELETS: 291 10*3/uL (ref 150–400)
Platelets: 296 10*3/uL (ref 150–400)
RBC: 4 MIL/uL — ABNORMAL LOW (ref 4.22–5.81)
RBC: 3.56 MIL/uL — ABNORMAL LOW (ref 4.22–5.81)
RDW: 13.4 % (ref 11.5–15.5)
RDW: 13.5 % (ref 11.5–15.5)
WBC: 8.3 10*3/uL (ref 4.0–10.5)
WBC: 10.9 10*3/uL — AB (ref 4.0–10.5)

## 2017-02-25 LAB — URINALYSIS, COMPLETE (UACMP) WITH MICROSCOPIC
BILIRUBIN URINE: NEGATIVE
GLUCOSE, UA: NEGATIVE mg/dL
KETONES UR: NEGATIVE mg/dL
LEUKOCYTES UA: NEGATIVE
NITRITE: NEGATIVE
PH: 6 (ref 5.0–8.0)
Protein, ur: NEGATIVE mg/dL
SPECIFIC GRAVITY, URINE: 1.013 (ref 1.005–1.030)

## 2017-02-25 LAB — COMPREHENSIVE METABOLIC PANEL
ALBUMIN: 4 g/dL (ref 3.5–5.0)
ALK PHOS: 57 U/L (ref 38–126)
ALT: 21 U/L (ref 17–63)
ANION GAP: 12 (ref 5–15)
AST: 19 U/L (ref 15–41)
BILIRUBIN TOTAL: 0.6 mg/dL (ref 0.3–1.2)
BUN: 15 mg/dL (ref 6–20)
CALCIUM: 9.6 mg/dL (ref 8.9–10.3)
CO2: 25 mmol/L (ref 22–32)
CREATININE: 1.22 mg/dL (ref 0.61–1.24)
Chloride: 104 mmol/L (ref 101–111)
GFR calc Af Amer: >60 mL/min (ref >60)
GFR calc non Af Amer: 55 mL/min — ABNORMAL LOW (ref >60)
GLUCOSE: 118 mg/dL — AB (ref 65–99)
Potassium: 3.9 mmol/L (ref 3.5–5.1)
Sodium: 141 mmol/L (ref 135–145)
TOTAL PROTEIN: 6.7 g/dL (ref 6.5–8.1)

## 2017-02-25 LAB — POC OCCULT BLOOD, ED: Fecal Occult Bld: POSITIVE — AB

## 2017-02-25 MED ORDER — SODIUM CHLORIDE 0.9 % IV BOLUS (SEPSIS)
1000.0000 mL | Freq: Once | INTRAVENOUS | Status: AC
  Administered 2017-02-25: 1000 mL via INTRAVENOUS

## 2017-02-25 MED ORDER — ACETAMINOPHEN 650 MG RE SUPP: 650.0000 mg | Freq: Four times a day (QID) | RECTAL | Status: DC | PRN

## 2017-02-25 MED ORDER — HYDROCODONE-ACETAMINOPHEN 5-325 MG PO TABS
1.0000 | ORAL_TABLET | ORAL | Status: DC | PRN
  Administered 2017-03-03: 1 via ORAL
  Filled 2017-02-25: qty 1

## 2017-02-25 MED ORDER — ONDANSETRON HCL 4 MG PO TABS
4.0000 mg | ORAL_TABLET | Freq: Four times a day (QID) | ORAL | Status: DC | PRN
Start: 2017-02-25 — End: 2017-03-03

## 2017-02-25 MED ORDER — SENNOSIDES-DOCUSATE SODIUM 8.6-50 MG PO TABS: 1.0000 | ORAL_TABLET | Freq: Every evening | ORAL | Status: DC | PRN

## 2017-02-25 MED ORDER — FINASTERIDE 5 MG PO TABS
5.0000 mg | ORAL_TABLET | Freq: Every day | ORAL | Status: DC
  Administered 2017-02-25 – 2017-03-03 (×6): 5 mg via ORAL
  Filled 2017-02-25 (×7): qty 1

## 2017-02-25 MED ORDER — ACETAMINOPHEN 325 MG PO TABS: 650.0000 mg | ORAL_TABLET | Freq: Four times a day (QID) | ORAL | Status: DC | PRN

## 2017-02-25 MED ORDER — ONDANSETRON HCL 4 MG/2ML IJ SOLN: 4.0000 mg | Freq: Four times a day (QID) | INTRAMUSCULAR | Status: DC | PRN

## 2017-02-25 MED ORDER — TAMSULOSIN HCL 0.4 MG PO CAPS
0.4000 mg | ORAL_CAPSULE | Freq: Every day | ORAL | Status: DC
Start: 2017-02-25 — End: 2017-03-03
  Administered 2017-02-25 – 2017-03-03 (×6): 0.4 mg via ORAL
  Filled 2017-02-25 (×6): qty 1

## 2017-02-25 MED ORDER — LEVOTHYROXINE SODIUM 112 MCG PO TABS
112.0000 ug | ORAL_TABLET | Freq: Every day | ORAL | Status: DC
  Administered 2017-02-27 – 2017-03-03 (×5): 112 ug via ORAL
  Filled 2017-02-25 (×5): qty 1

## 2017-02-25 MED ORDER — SODIUM CHLORIDE 0.9 % IV SOLN
INTRAVENOUS | Status: DC
  Administered 2017-02-25 – 2017-02-26 (×2): via INTRAVENOUS
  Administered 2017-02-26: 100 mL/h via INTRAVENOUS

## 2017-02-25 MED ORDER — BISACODYL 10 MG RE SUPP: 10.0000 mg | Freq: Every day | RECTAL | Status: DC | PRN

## 2017-02-25 MED ORDER — AMLODIPINE BESYLATE 10 MG PO TABS: 10.0000 mg | ORAL_TABLET | Freq: Every day | ORAL | Status: DC

## 2017-02-25 MED ORDER — IRBESARTAN 75 MG PO TABS
150.0000 mg | ORAL_TABLET | Freq: Every day | ORAL | Status: DC
  Filled 2017-02-25: qty 1

## 2017-02-25 MED ORDER — AMLODIPINE-OLMESARTAN 10-20 MG PO TABS: 1.0000 | ORAL_TABLET | Freq: Every day | ORAL | Status: DC

## 2017-02-25 MED ORDER — BOOST / RESOURCE BREEZE PO LIQD
1.0000 | Freq: Three times a day (TID) | ORAL | Status: DC
  Administered 2017-02-25 – 2017-03-03 (×12): 1 via ORAL

## 2017-02-25 MED ORDER — ASPIRIN EC 81 MG PO TBEC
81.0000 mg | DELAYED_RELEASE_TABLET | Freq: Every day | ORAL | Status: DC
  Filled 2017-02-25: qty 1

## 2017-02-25 MED ORDER — PANTOPRAZOLE SODIUM 40 MG IV SOLR: 40.0000 mg | INTRAVENOUS | Status: DC

## 2017-02-25 NOTE — ED Notes (Signed)
Pt has had two moderate amounts of bloody stool since moving back to room. Denies dizziness or weakness

## 2017-02-25 NOTE — ED Notes (Signed)
Got patient up to the bathroom patient is resting with call bell in reach

## 2017-02-25 NOTE — ED Notes (Signed)
Per Dr. Marily Memos, diet NPO for midnight tonight.  Clear liquids tray ordered for dinner for patient by Leafy Ro, NS

## 2017-02-25 NOTE — Consult Note (Signed)
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  His FFS with APC was performed on 03/09/2015 for hematochezia.  The APC was successful and he did not have any further bleeding until this time.  He reports clots and this was confirmed by the ER physician.  His colonoscopy performed on 02/01/2015 was performed for heme positive stool and several polyps were removed.  There was no evidence of diverticula.  Even though his anemia is very mild, he was feeling light headed, which precipitated the admission.  Past Medical History:  Diagnosis Date  . CKD (chronic kidney disease), stage II   . Hypertension   . Hypothyroidism   . Lower urinary tract symptoms (LUTS)   . Microhematuria    history of, work-up in 2009 negative  . Prostate cancer ([place]) 02/23/14   gleason 3+4=7, volume 25.98 cc  . Prostate enlargement   . Sinus problem   . Thyroid disease     Past Surgical History:  Procedure Laterality Date  . FLEXIBLE SIGMOIDOSCOPY N/A 03/09/2015   Procedure: FLEXIBLE SIGMOIDOSCOPY;  Surgeon: Carol Ada, MD;  Location: [facility];  Service: Endoscopy;  Laterality: N/A;  . PROSTATE BIOPSY  02/23/14   Gleason 7, volume 25.98 cc  . TONSILLECTOMY     age 82    Family History  Problem Relation Age of Onset  . Aneurysm Mother   . Cancer Brother     Social History:  reports that he has quit smoking. He smoked 0.25 packs per day. He has never used smokeless tobacco. He reports that he does not drink alcohol or use drugs.  Allergies: No Known Allergies  Medications:  Scheduled: . amlodipine-olmesartan  1 tablet Oral Daily  . aspirin EC  81 mg Oral Daily  . finasteride  5 mg Oral Daily  . [START ON 02/26/2017] levothyroxine  112 mcg Oral QAC breakfast  . tamsulosin  0.4 mg Oral Daily   Continuous: . sodium  chloride      Results for orders placed or performed during the hospital encounter of 02/25/17 (from the past 24 hour(s))  Comprehensive metabolic panel     Status: Abnormal   Collection Time: 02/25/17  5:19 AM  Result Value Ref Range   Sodium 141 135 - 145 mmol/L   Potassium 3.9 3.5 - 5.1 mmol/L   Chloride 104 101 - 111 mmol/L   CO2 25 22 - 32 mmol/L   Glucose, Bld 118 (H) 65 - 99 mg/dL   BUN 15 6 - 20 mg/dL   Creatinine, Ser 1.22 0.61 - 1.24 mg/dL   Calcium 9.6 8.9 - 10.3 mg/dL   Total Protein 6.7 6.5 - 8.1 g/dL   Albumin 4.0 3.5 - 5.0 g/dL   AST 19 15 - 41 U/L   ALT 21 17 - 63 U/L   Alkaline Phosphatase 57 38 - 126 U/L   Total Bilirubin 0.6 0.3 - 1.2 mg/dL   GFR calc non Af Amer 55 (L) >60 mL/min   GFR calc Af Amer >60 >60 mL/min   Anion gap 12 5 - 15  CBC     Status: Abnormal   Collection Time: 02/25/17  5:19 AM  Result Value Ref Range   WBC 8.3 4.0 - 10.5 K/uL   RBC 4.00 (L) 4.22 - 5.81 MIL/uL   Hemoglobin 11.9 (L) 13.0 - 17.0 g/dL  HCT 36.9 (L) 39.0 - 52.0 %   MCV 92.3 78.0 - 100.0 fL   MCH 29.8 26.0 - 34.0 pg   MCHC 32.2 30.0 - 36.0 g/dL   RDW 13.4 11.5 - 15.5 %   Platelets 296 150 - 400 K/uL  Type and screen [place]     Status: None   Collection Time: 02/25/17  5:19 AM  Result Value Ref Range   ABO/RH(D) B POS    Antibody Screen NEG    Sample Expiration 02/28/2017   POC occult blood, ED     Status: Abnormal   Collection Time: 02/25/17  6:29 AM  Result Value Ref Range   Fecal Occult Bld POSITIVE (A) NEGATIVE  CBC     Status: Abnormal   Collection Time: 02/25/17  9:50 AM  Result Value Ref Range   WBC 10.9 (H) 4.0 - 10.5 K/uL   RBC 3.56 (L) 4.22 - 5.81 MIL/uL   Hemoglobin 10.8 (L) 13.0 - 17.0 g/dL   HCT 32.9 (L) 39.0 - 52.0 %   MCV 92.4 78.0 - 100.0 fL   MCH 30.3 26.0 - 34.0 pg   MCHC 32.8 30.0 - 36.0 g/dL   RDW 13.5 11.5 - 15.5 %   Platelets 291 150 - 400 K/uL  Urinalysis, Complete w Microscopic     Status: Abnormal   Collection  Time: 02/25/17  2:00 PM  Result Value Ref Range   Color, Urine YELLOW YELLOW   APPearance CLEAR CLEAR   Specific Gravity, Urine 1.013 1.005 - 1.030   pH 6.0 5.0 - 8.0   Glucose, UA NEGATIVE NEGATIVE mg/dL   Hgb urine dipstick MODERATE (A) NEGATIVE   Bilirubin Urine NEGATIVE NEGATIVE   Ketones, ur NEGATIVE NEGATIVE mg/dL   Protein, ur NEGATIVE NEGATIVE mg/dL   Nitrite NEGATIVE NEGATIVE   Leukocytes, UA NEGATIVE NEGATIVE   RBC / HPF 0-5 0 - 5 RBC/hpf   WBC, UA 0-5 0 - 5 WBC/hpf   Bacteria, UA RARE (A) NONE SEEN   Squamous Epithelial / LPF 0-5 (A) NONE SEEN   Mucus PRESENT      No results found.  ROS:  As stated above in the HPI otherwise negative.  Blood pressure (!) 161/73, pulse 72, temperature 97.8 F (36.6 C), temperature source Oral, resp. rate 18, height 6' (1.829 m), weight 96.2 kg (212 lb), SpO2 100 %.    PE: Gen: NAD, Alert and Oriented HEENT:  [place]/AT, EOMI Neck: Supple, no LAD Lungs: CTA Bilaterally CV: RRR without M/G/R ABM: Soft, NTND, +BS Ext: No C/C/E  Assessment/Plan: 1) Hematochezia. 2) History of radiation proctitis. 3) Mild anemia.   With his history of bleeding from his radiation proctitis, I suspect that he has recurrent bleeding from this issue.  I will pursue a FFS with APC tomorrow.  Plan: 1) FFS with APC.  Augie Vane D 02/25/2017, 3:37 PM

## 2017-02-25 NOTE — ED Triage Notes (Signed)
Pt states he woke up to go to the bathroom an had a grapefruit sized amount of blood to his bedsheet. States he takes Asprin daily and took a pain medication before bed for his arthritis but not sure which one. Alert and oriented, denies dizziness.

## 2017-02-25 NOTE — ED Provider Notes (Signed)
[facility] DEPT Provider Note   CSN: [id number] Arrival date & time: 02/25/17  0508     History   Chief Complaint Chief Complaint  Patient presents with  . Rectal Bleeding    HPI Adrian Ellis is a 76 y.o. male.  The history is provided by the patient and medical records. No language interpreter was used.  Rectal Bleeding  Quality:  Bright red Amount:  Copious Duration:  1 day Timing:  Constant Chronicity:  New Context: not foreign body, not hemorrhoids and not rectal pain   Similar prior episodes: yes   Relieved by:  Nothing Worsened by:  Nothing Ineffective treatments:  None tried Associated symptoms: no abdominal pain, no dizziness, no fever, no light-headedness and no vomiting     Past Medical History:  Diagnosis Date  . CKD (chronic kidney disease), stage II   . Hypertension   . Hypothyroidism   . Lower urinary tract symptoms (LUTS)   . Microhematuria    history of, work-up in 2009 negative  . Prostate cancer ([place]) 02/23/14   gleason 3+4=7, volume 25.98 cc  . Prostate enlargement   . Sinus problem   . Thyroid disease     Patient Active Problem List   Diagnosis Date Noted  . AKI (acute kidney injury) ([place])   . Bleeding gastrointestinal   . Rectal bleeding 02/25/2015  . Acute on chronic kidney failure ([place]) 02/25/2015  . Hypothyroidism   . Thyroid activity decreased   . Malignant neoplasm of prostate ([place]) 03/09/2014  . Syncope 04/23/2013  . Hypertension 04/23/2013  . Prostate enlargement     Past Surgical History:  Procedure Laterality Date  . FLEXIBLE SIGMOIDOSCOPY N/A 03/09/2015   Procedure: FLEXIBLE SIGMOIDOSCOPY;  Surgeon: Carol Ada, MD;  Location: [facility];  Service: Endoscopy;  Laterality: N/A;  . PROSTATE BIOPSY  02/23/14   Gleason 7, volume 25.98 cc  . TONSILLECTOMY     age 11       Home Medications    Prior to Admission medications   Medication Sig Start Date End Date Taking? Authorizing Provider  amLODipine  (NORVASC) 10 MG tablet Take 1 tablet (10 mg total) by mouth daily. 02/26/15   Reyne Dumas, MD  dutasteride (AVODART) 0.5 MG capsule Take 0.5 mg by mouth daily.    [provider]  esomeprazole (NEXIUM) 40 MG packet Take 40 mg by mouth daily before breakfast. 02/26/15   Reyne Dumas, MD  levothyroxine (SYNTHROID, LEVOTHROID) 112 MCG tablet Take 112 mcg by mouth daily.    [provider]  Multiple Vitamin (MULTIVITAMIN WITH MINERALS) TABS Take 1 tablet by mouth daily.    [provider]  tamsulosin (FLOMAX) 0.4 MG CAPS capsule Take 0.4 mg by mouth daily.    [provider]    Family History Family History  Problem Relation Age of Onset  . Aneurysm Mother   . Cancer Brother     Social History Social History  Substance Use Topics  . Smoking status: Former Smoker    Packs/day: 0.25  . Smokeless tobacco: Never Used     Comment: quit many years ago  . Alcohol use No     Allergies   Patient has no known allergies.   Review of Systems Review of Systems  Constitutional: Positive for fatigue. Negative for chills, diaphoresis and fever.  HENT: Negative for congestion.   Respiratory: Negative for cough, chest tightness and shortness of breath.   Cardiovascular: Negative for chest pain and palpitations.  Gastrointestinal: Positive for hematochezia.  Negative for abdominal pain and vomiting.  Genitourinary: Negative for dysuria and flank pain.  Musculoskeletal: Negative for back pain, neck pain and neck stiffness.  Neurological: Negative for dizziness, light-headedness and headaches.  Psychiatric/Behavioral: Negative for agitation and confusion.  All other systems reviewed and are negative.    Physical Exam Updated Vital Signs BP (!) 167/76   Pulse 83   Temp 97.8 F (36.6 C) (Oral)   Resp 18   Ht 6' (1.829 m)   Wt 96.2 kg (212 lb)   SpO2 97%   BMI 28.75 kg/m   Physical Exam  Constitutional: He is oriented to person, place, and time. He  appears well-developed and well-nourished. No distress.  HENT:  Head: Normocephalic and atraumatic.  Mouth/Throat: Oropharynx is clear and moist. No oropharyngeal exudate.  Eyes: Conjunctivae are normal.  Neck: Neck supple.  Cardiovascular: Normal rate, regular rhythm and intact distal pulses.   No murmur heard. Pulmonary/Chest: Effort normal and breath sounds normal. No respiratory distress. He has no wheezes. He exhibits no tenderness.  Abdominal: Soft. There is no tenderness.  Genitourinary: Rectal exam shows guaiac positive stool. Rectal exam shows no external hemorrhoid and no tenderness.  Genitourinary Comments: Bright red blood seen on DRE.   Musculoskeletal: He exhibits no edema or tenderness.  Neurological: He is alert and oriented to person, place, and time. No sensory deficit. He exhibits normal muscle tone.  Skin: Skin is warm and dry. He is not diaphoretic.  Psychiatric: He has a normal mood and affect.  Nursing note and vitals reviewed.    ED Treatments / Results  Labs (all labs ordered are listed, but only abnormal results are displayed) Labs Reviewed  COMPREHENSIVE METABOLIC PANEL - Abnormal; Notable for the following:       Result Value   Glucose, Bld 118 (*)    GFR calc non Af Amer 55 (*)    All other components within normal limits  CBC - Abnormal; Notable for the following:    RBC 4.00 (*)    Hemoglobin 11.9 (*)    HCT 36.9 (*)    All other components within normal limits  CBC - Abnormal; Notable for the following:    WBC 10.9 (*)    RBC 3.56 (*)    Hemoglobin 10.8 (*)    HCT 32.9 (*)    All other components within normal limits  URINALYSIS, COMPLETE (UACMP) WITH MICROSCOPIC - Abnormal; Notable for the following:    Hgb urine dipstick MODERATE (*)    Bacteria, UA RARE (*)    Squamous Epithelial / LPF 0-5 (*)    All other components within normal limits  POC OCCULT BLOOD, ED - Abnormal; Notable for the following:    Fecal Occult Bld POSITIVE (*)     All other components within normal limits  TYPE AND SCREEN    EKG  EKG Interpretation None       Radiology No results found.  Procedures Procedures (including critical care time)  Medications Ordered in ED Medications  amlodipine-olmesartan (AZOR) 10-20 MG per tablet 1 tablet (not administered)  finasteride (PROSCAR) tablet 5 mg (not administered)  levothyroxine (SYNTHROID, LEVOTHROID) tablet 112 mcg (not administered)  tamsulosin (FLOMAX) capsule 0.4 mg (not administered)  0.9 %  sodium chloride infusion (not administered)  acetaminophen (TYLENOL) tablet 650 mg (not administered)    Or  acetaminophen (TYLENOL) suppository 650 mg (not administered)  senna-docusate (Senokot-S) tablet 1 tablet (not administered)  bisacodyl (DULCOLAX) suppository 10 mg (not administered)  ondansetron (ZOFRAN)  tablet 4 mg (not administered)    Or  ondansetron (ZOFRAN) injection 4 mg (not administered)  HYDROcodone-acetaminophen (NORCO/VICODIN) 5-325 MG per tablet 1-2 tablet (not administered)  aspirin EC tablet 81 mg (not administered)  sodium chloride 0.9 % bolus 1,000 mL (0 mLs Intravenous Stopped 02/25/17 0730)     Initial Impression / Assessment and Plan / ED Course  I have reviewed the triage vital signs and the nursing notes.  Pertinent labs & imaging results that were available during my care of the patient were reviewed by me and considered in my medical decision making (see chart for details).     Adrian Ellis is a 76 y.o. male with past medical history significant for prostate cancer status post radiation, hypertension, prior GI bleed of unknown origin, and hypothyroidismwho presents with rectal bleeding overnight. Patient reports that he woke up early this morning with significant bright rectal bleeding with blood clots in the bed sheets. He reports partially 4 episodes of bright red large rectal bleeding episodes. He denies fevers, chills, lightheadedness, palpitations, nausea,  vomiting, urinary symptoms. He says this is similar to several years ago when he had bleeding and had to be admitted to the hospital. He denies any history of hemorrhoids or constipation. He reports feeling normal over the last few days. He reports that he takes aspirin.   On exam, patient's abdomen is nontender. Patient had clear lungs and nontender chest. No CVA tenderness. Rectal exam revealed  red blood. No significant prostate tenderness. Fecal occult test positive.  Patient had several episodes of bright red bleeding in the emergency department.  Initial hemoglobin was 11.9 with a normal platelet count. CMP grossly unremarkable.  Patient will have repeat hemoglobin to determine if patient is downtrending.    12:40 PM Patient's repeat hemoglobin returned at 10.8, approximate 1 unit lower after several hours. This raises concern for worsening anemia in the setting of rectal bleeding.  Dr. Benson Norway is the gastric relative to today's sigmoidoscopy on the patient 2 years ago with similar symptoms. 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 They used cauterization to fix this previously.  Dr. Benson Norway was called with [facility]. Dr. Benson Norway recommended admission to the hospitalist service for further monitoring and trending of hematomas. He will see the patient and likely do a procedure to intervene tomorrow if the patient continues having bleeding.   Hospitalist service called for admission for further management of the 76 year old with decreasing hemoglobin and rectal bleeding.   Final Clinical Impressions(s) / ED Diagnoses   Final diagnoses:  Rectal bleeding     Clinical Impression: 1. Rectal bleeding     Disposition: Admit to Hospitalist service    Tegeler, Gwenyth Allegra, MD 02/25/17 1622

## 2017-02-25 NOTE — ED Notes (Signed)
Family at bedside. 

## 2017-02-25 NOTE — Progress Notes (Addendum)
NT informed me that patient was feeling faint lightheaded. Placed patient in Trend position for 10 minutes BP148/66 HR 67. Patient stated that he felt better. I informed patient not to attempt to get up without assistance. Bed alarm placed will continue to monitor. Arthor Captain LPN

## 2017-02-25 NOTE — H&P (Signed)
History and Physical    Adrian Ellis BZJ:[id number] DOB: 1941/02/14 DOA: 02/25/2017   PCP: Iona Beard, MD   Patient coming from:  Home    Chief Complaint: Rectal Bleed  HPI: Adrian Ellis is a 76 y.o. male with medical history significant for CAD, hypertension, hypothyroidism, prostate cancer and prostate enlargement, hypothyroidism, presenting with rectal bleeding. The patient woke up this morning with his bed sheets filled with loss, with blood clots. Of note, the patient reports likely 4 episodes of bright red blood in her rectum over the last 4 or 5 months. He denies any fever or chills, lightheadedness, palpitations, nausea or vomiting, or urinary symptoms. 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 The use of cauterization, without any intercurrent centigrade recently.Marland Kitchen He denies any history of hemorrhoids or constipation. He does take aspirin. He denies any unexplained weight loss. He does not smoke, drink or partakes recreational drugs. No other areas of bleeding. Denies a history of colon cancer.  ED Course:  BP (!) 157/73   Pulse 73   Temp 97.8 F (36.6 C) (Oral)   Resp 17   Ht 6' (1.829 m)   Wt 96.2 kg (212 lb)   SpO2 100%   BMI 28.75 kg/m    Glucose 118 Hemoglobin 11.9, with repeat 10.9. MCV 92.4. BUN 15, creatinine 1.22 Hemoccult positive Type and screen pending GI to evaluate the patient today, likely to proceed with endoscopy in the morning.  Review of Systems:  As per HPI otherwise all other systems reviewed and are negative  Past Medical History:  Diagnosis Date  . CKD (chronic kidney disease), stage II   . Hypertension   . Hypothyroidism   . Lower urinary tract symptoms (LUTS)   . Microhematuria    history of, work-up in 2009 negative  . Prostate cancer ([place]) 02/23/14   gleason 3+4=7, volume 25.98 cc  .  Prostate enlargement   . Sinus problem   . Thyroid disease     Past Surgical History:  Procedure Laterality Date  . FLEXIBLE SIGMOIDOSCOPY N/A 03/09/2015   Procedure: FLEXIBLE SIGMOIDOSCOPY;  Surgeon: Carol Ada, MD;  Location: [facility];  Service: Endoscopy;  Laterality: N/A;  . PROSTATE BIOPSY  02/23/14   Gleason 7, volume 25.98 cc  . TONSILLECTOMY     age 73    Social History Social History   Social History  . Marital status: Widowed    Spouse name: N/A  . Number of children: N/A  . Years of education: N/A   Occupational History  . Not on file.   Social History Main Topics  . Smoking status: Former Smoker    Packs/day: 0.25  . Smokeless tobacco: Never Used     Comment: quit many years ago  . Alcohol use No  . Drug use: No  . Sexual activity: Not on file   Other Topics Concern  . Not on file   Social History Narrative  . No narrative on file     No Known Allergies  Family History  Problem Relation Age of Onset  . Aneurysm Mother   . Cancer Brother       Prior to Admission medications   Medication Sig Start Date End Date Taking? Authorizing Provider  amlodipine-olmesartan (AZOR) 10-20 MG tablet Take 1 tablet by mouth daily.   Yes [provider]  aspirin 81 MG chewable  tablet Chew 81 mg by mouth daily.   Yes [provider]  finasteride (PROSCAR) 5 MG tablet Take 5 mg by mouth daily.   Yes [provider]  levothyroxine (SYNTHROID, LEVOTHROID) 112 MCG tablet Take 112 mcg by mouth daily before breakfast.   Yes [provider]  Multiple Vitamin (MULTIVITAMIN WITH MINERALS) TABS Take 1 tablet by mouth daily.   Yes [provider]  tamsulosin (FLOMAX) 0.4 MG CAPS capsule Take 0.4 mg by mouth daily.   Yes [provider]  amLODipine (NORVASC) 10 MG tablet Take 1 tablet (10 mg total) by mouth daily. Patient not taking: Reported on 02/25/2017 02/26/15   Reyne Dumas, MD  esomeprazole (NEXIUM) 40 MG  packet Take 40 mg by mouth daily before breakfast. Patient not taking: Reported on 02/25/2017 02/26/15   Reyne Dumas, MD    Physical Exam:  Vitals:   02/25/17 1211 02/25/17 1230 02/25/17 1245 02/25/17 1300  BP:  130/70 (!) 151/91 (!) 157/73  Pulse: 75 72 70 73  Resp: 15 15 11 17   Temp:      TempSrc:      SpO2: 100% 100% 99% 100%  Weight:      Height:       Constitutional: NAD, calm, comfortable. Eyes: PERRL, lids and conjunctivae normal ENMT: Mucous membranes are moist, without exudate or lesions  Neck: normal, supple, no masses, no thyromegaly Respiratory: clear to auscultation bilaterally, no wheezing, no crackles. Normal respiratory effort  Cardiovascular: Regular rate and rhythm,  murmur, rubs or gallops. No extremity edema. 2+ pedal pulses. No carotid bruits.  Abdomen: Soft, non tender, No hepatosplenomegaly. Bowel sounds positive.  Musculoskeletal: no clubbing / cyanosis. Moves all extremities Skin: no jaundice, No lesions.  Neurologic: Sensation intact  Strength equal in all extremities Psychiatric:   Alert and oriented x 3. Normal mood.     Labs on Admission: I have personally reviewed following labs and imaging studies  CBC:  Recent Labs Lab 02/25/17 0519 02/25/17 0950  WBC 8.3 10.9*  HGB 11.9* 10.8*  HCT 36.9* 32.9*  MCV 92.3 92.4  PLT 296 834    Basic Metabolic Panel:  Recent Labs Lab 02/25/17 0519  NA 141  K 3.9  CL 104  CO2 25  GLUCOSE 118*  BUN 15  CREATININE 1.22  CALCIUM 9.6    GFR: Estimated Creatinine Clearance: 61 mL/min (by C-G formula based on SCr of 1.22 mg/dL).  Liver Function Tests:  Recent Labs Lab 02/25/17 0519  AST 19  ALT 21  ALKPHOS 57  BILITOT 0.6  PROT 6.7  ALBUMIN 4.0   No results for input(s): LIPASE, AMYLASE in the last 168 hours. No results for input(s): AMMONIA in the last 168 hours.  Coagulation Profile: No results for input(s): INR, PROTIME in the last 168 hours.  Cardiac Enzymes: No results for  input(s): CKTOTAL, CKMB, CKMBINDEX, TROPONINI in the last 168 hours.  BNP (last 3 results) No results for input(s): PROBNP in the last 8760 hours.  HbA1C: No results for input(s): HGBA1C in the last 72 hours.  CBG: No results for input(s): GLUCAP in the last 168 hours.  Lipid Profile: No results for input(s): CHOL, HDL, LDLCALC, TRIG, CHOLHDL, LDLDIRECT in the last 72 hours.  Thyroid Function Tests: No results for input(s): TSH, T4TOTAL, FREET4, T3FREE, THYROIDAB in the last 72 hours.  Anemia Panel: No results for input(s): VITAMINB12, FOLATE, FERRITIN, TIBC, IRON, RETICCTPCT in the last 72 hours.  Urine analysis: No results found for: COLORURINE, APPEARANCEUR,  LABSPEC, PHURINE, GLUCOSEU, HGBUR, BILIRUBINUR, KETONESUR, PROTEINUR, UROBILINOGEN, NITRITE, LEUKOCYTESUR  Sepsis Labs: @LABRCNTIP (procalcitonin:4,lacticidven:4) )No results found for this or any previous visit (from the past 240 hour(s)).   Radiological Exams on Admission: No results found.  EKG: Independently reviewed.  Assessment/Plan Active Problems:   Rectal bleeding   Bleeding gastrointestinal   Prostate enlargement   Malignant neoplasm of prostate (HCC)   Hypothyroidism   Acute on chronic kidney failure (HCC)   Thyroid activity decreased   GI bleed      Lower GI bleed, recurrent. History of same thought to be secondary to radiation prostatitis. He is on ASA  Per EDP on digital rectal exam stool was positive  Hb 11.9 ->10.5  BUN normal at 15 suggestive of lower GI   Admit to telemetry CBC this evening  Type and screen complete  NPo for now until GI evaluation. If no scoping today,  liquid diet and NPO after midnight  IV fluid  Protonix 40mg  IV BID pending GI eval Hold all NSAIDs including preadmission baby aspirin GI consult    Hypertension BP 157/73   Pulse 73    Continue home anti-hypertensive medications    Hypothyroidism: Continue home Synthroid  Chronic kidney disease stage 2 Cr 1.22,  BL 1.0  Lab Results  Component Value Date   CREATININE 1.22 02/25/2017   CREATININE 1.01 02/26/2015   CREATININE 1.28 (H) 02/25/2015  IVF Repeat CMET in am   History of Prostate Cancer and BPH , no acute issues Continue Flomax   DVT prophylaxis:  SCD  Code Status:    Full  Family Communication:  Discussed with patient Disposition Plan: Expect patient to be discharged to home after condition improves Consults called:    GI per EDP, Dr. Benson Norway Admission status: Med Surg Obs    Rondel Jumbo, PA-C [facility]   02/25/2017, 2:16 PM

## 2017-02-26 ENCOUNTER — Encounter (HOSPITAL_COMMUNITY)
Admission: EM | Disposition: A | Discharge status: Home / Self Care | Payer: Self-pay | Source: Home / Self Care | Attending: Nephrology

## 2017-02-26 ENCOUNTER — Encounter (HOSPITAL_COMMUNITY): Payer: Self-pay | Admitting: *Deleted

## 2017-02-26 DIAGNOSIS — Z8546 Personal history of malignant neoplasm of prostate: Secondary | ICD-10-CM | POA: Diagnosis not present

## 2017-02-26 DIAGNOSIS — F329 Major depressive disorder, single episode, unspecified: Secondary | ICD-10-CM | POA: Diagnosis present

## 2017-02-26 DIAGNOSIS — K449 Diaphragmatic hernia without obstruction or gangrene: Secondary | ICD-10-CM | POA: Diagnosis not present

## 2017-02-26 DIAGNOSIS — E039 Hypothyroidism, unspecified: Secondary | ICD-10-CM | POA: Diagnosis not present

## 2017-02-26 DIAGNOSIS — Z87891 Personal history of nicotine dependence: Secondary | ICD-10-CM | POA: Diagnosis not present

## 2017-02-26 DIAGNOSIS — D62 Acute posthemorrhagic anemia: Secondary | ICD-10-CM | POA: Diagnosis not present

## 2017-02-26 DIAGNOSIS — I700 Atherosclerosis of aorta: Secondary | ICD-10-CM | POA: Diagnosis present

## 2017-02-26 DIAGNOSIS — Z6828 Body mass index (BMI) 28.0-28.9, adult: Secondary | ICD-10-CM | POA: Diagnosis not present

## 2017-02-26 DIAGNOSIS — Z9841 Cataract extraction status, right eye: Secondary | ICD-10-CM | POA: Diagnosis not present

## 2017-02-26 DIAGNOSIS — I774 Celiac artery compression syndrome: Secondary | ICD-10-CM | POA: Diagnosis not present

## 2017-02-26 DIAGNOSIS — Z7982 Long term (current) use of aspirin: Secondary | ICD-10-CM | POA: Diagnosis not present

## 2017-02-26 DIAGNOSIS — I10 Essential (primary) hypertension: Secondary | ICD-10-CM

## 2017-02-26 DIAGNOSIS — K551 Chronic vascular disorders of intestine: Secondary | ICD-10-CM | POA: Diagnosis not present

## 2017-02-26 DIAGNOSIS — K627 Radiation proctitis: Secondary | ICD-10-CM | POA: Diagnosis not present

## 2017-02-26 DIAGNOSIS — D509 Iron deficiency anemia, unspecified: Secondary | ICD-10-CM | POA: Diagnosis not present

## 2017-02-26 DIAGNOSIS — N400 Enlarged prostate without lower urinary tract symptoms: Secondary | ICD-10-CM | POA: Diagnosis present

## 2017-02-26 DIAGNOSIS — Z923 Personal history of irradiation: Secondary | ICD-10-CM | POA: Diagnosis not present

## 2017-02-26 DIAGNOSIS — K625 Hemorrhage of anus and rectum: Secondary | ICD-10-CM | POA: Diagnosis not present

## 2017-02-26 DIAGNOSIS — K922 Gastrointestinal hemorrhage, unspecified: Secondary | ICD-10-CM | POA: Diagnosis not present

## 2017-02-26 DIAGNOSIS — Z809 Family history of malignant neoplasm, unspecified: Secondary | ICD-10-CM | POA: Diagnosis not present

## 2017-02-26 DIAGNOSIS — I129 Hypertensive chronic kidney disease with stage 1 through stage 4 chronic kidney disease, or unspecified chronic kidney disease: Secondary | ICD-10-CM | POA: Diagnosis present

## 2017-02-26 DIAGNOSIS — Z79899 Other long term (current) drug therapy: Secondary | ICD-10-CM | POA: Diagnosis not present

## 2017-02-26 DIAGNOSIS — Z9842 Cataract extraction status, left eye: Secondary | ICD-10-CM | POA: Diagnosis not present

## 2017-02-26 DIAGNOSIS — N179 Acute kidney failure, unspecified: Secondary | ICD-10-CM | POA: Diagnosis present

## 2017-02-26 DIAGNOSIS — I2510 Atherosclerotic heart disease of native coronary artery without angina pectoris: Secondary | ICD-10-CM | POA: Diagnosis present

## 2017-02-26 DIAGNOSIS — C61 Malignant neoplasm of prostate: Secondary | ICD-10-CM | POA: Diagnosis present

## 2017-02-26 DIAGNOSIS — K5791 Diverticulosis of intestine, part unspecified, without perforation or abscess with bleeding: Secondary | ICD-10-CM | POA: Diagnosis present

## 2017-02-26 DIAGNOSIS — K219 Gastro-esophageal reflux disease without esophagitis: Secondary | ICD-10-CM | POA: Diagnosis present

## 2017-02-26 DIAGNOSIS — R933 Abnormal findings on diagnostic imaging of other parts of digestive tract: Secondary | ICD-10-CM | POA: Diagnosis not present

## 2017-02-26 DIAGNOSIS — N183 Chronic kidney disease, stage 3 (moderate): Secondary | ICD-10-CM | POA: Diagnosis present

## 2017-02-26 DIAGNOSIS — Z961 Presence of intraocular lens: Secondary | ICD-10-CM | POA: Diagnosis present

## 2017-02-26 HISTORY — PX: FLEXIBLE SIGMOIDOSCOPY: SHX5431

## 2017-02-26 HISTORY — PX: ESOPHAGOGASTRODUODENOSCOPY: SHX5428

## 2017-02-26 LAB — CBC
HCT: 26.2 % — ABNORMAL LOW (ref 39.0–52.0)
HEMATOCRIT: 23.1 % — AB (ref 39.0–52.0)
HEMATOCRIT: 28.7 % — AB (ref 39.0–52.0)
Hemoglobin: 8.5 g/dL — ABNORMAL LOW (ref 13.0–17.0)
Hemoglobin: 7.5 g/dL — ABNORMAL LOW (ref 13.0–17.0)
Hemoglobin: 9.4 g/dL — ABNORMAL LOW (ref 13.0–17.0)
MCH: 30 pg (ref 26.0–34.0)
MCH: 30 pg (ref 26.0–34.0)
MCH: 29.8 pg (ref 26.0–34.0)
MCHC: 32.4 g/dL (ref 30.0–36.0)
MCHC: 32.5 g/dL (ref 30.0–36.0)
MCHC: 32.8 g/dL (ref 30.0–36.0)
MCV: 92.6 fL (ref 78.0–100.0)
MCV: 92.4 fL (ref 78.0–100.0)
MCV: 91.1 fL (ref 78.0–100.0)
PLATELETS: 209 10*3/uL (ref 150–400)
Platelets: 229 10*3/uL (ref 150–400)
Platelets: 196 10*3/uL (ref 150–400)
RBC: 2.83 MIL/uL — AB (ref 4.22–5.81)
RBC: 2.5 MIL/uL — AB (ref 4.22–5.81)
RBC: 3.15 MIL/uL — ABNORMAL LOW (ref 4.22–5.81)
RDW: 13.5 % (ref 11.5–15.5)
RDW: 13.5 % (ref 11.5–15.5)
RDW: 13.7 % (ref 11.5–15.5)
WBC: 11.8 10*3/uL — AB (ref 4.0–10.5)
WBC: 9.2 10*3/uL (ref 4.0–10.5)
WBC: 9.3 10*3/uL (ref 4.0–10.5)

## 2017-02-26 LAB — COMPREHENSIVE METABOLIC PANEL
ALK PHOS: 40 U/L (ref 38–126)
ALT: 16 U/L — AB (ref 17–63)
AST: 17 U/L (ref 15–41)
Albumin: 3.1 g/dL — ABNORMAL LOW (ref 3.5–5.0)
Anion gap: 6 (ref 5–15)
BUN: 13 mg/dL (ref 6–20)
CALCIUM: 8 mg/dL — AB (ref 8.9–10.3)
CO2: 24 mmol/L (ref 22–32)
CREATININE: 1.26 mg/dL — AB (ref 0.61–1.24)
Chloride: 106 mmol/L (ref 101–111)
GFR, EST NON AFRICAN AMERICAN: 53 mL/min — AB (ref >60)
Glucose, Bld: 143 mg/dL — ABNORMAL HIGH (ref 65–99)
Potassium: 3.7 mmol/L (ref 3.5–5.1)
Sodium: 136 mmol/L (ref 135–145)
Total Bilirubin: 0.9 mg/dL (ref 0.3–1.2)
Total Protein: 5 g/dL — ABNORMAL LOW (ref 6.5–8.1)

## 2017-02-26 LAB — PROTIME-INR
INR: 1.13
Prothrombin Time: 14.4 seconds (ref 11.4–15.2)

## 2017-02-26 LAB — PREPARE RBC (CROSSMATCH)

## 2017-02-26 SURGERY — EGD (ESOPHAGOGASTRODUODENOSCOPY)
Anesthesia: Moderate Sedation

## 2017-02-26 SURGERY — SIGMOIDOSCOPY, FLEXIBLE
Anesthesia: Moderate Sedation

## 2017-02-26 MED ORDER — FLEET ENEMA 7-19 GM/118ML RE ENEM
1.0000 | ENEMA | Freq: Once | RECTAL | Status: AC
  Administered 2017-02-26: 1 via RECTAL
  Filled 2017-02-26: qty 1

## 2017-02-26 MED ORDER — FENTANYL CITRATE (PF) 100 MCG/2ML IJ SOLN
INTRAMUSCULAR | Status: DC | PRN
  Administered 2017-02-26 (×2): 12.5 ug via INTRAVENOUS
  Administered 2017-02-26: 25 ug via INTRAVENOUS

## 2017-02-26 MED ORDER — FENTANYL CITRATE (PF) 100 MCG/2ML IJ SOLN
INTRAMUSCULAR | Status: AC
  Filled 2017-02-26: qty 2

## 2017-02-26 MED ORDER — SODIUM CHLORIDE 0.9 % IV SOLN: INTRAVENOUS | Status: DC

## 2017-02-26 MED ORDER — MIDAZOLAM HCL 5 MG/ML IJ SOLN
INTRAMUSCULAR | Status: AC
  Filled 2017-02-26: qty 2

## 2017-02-26 MED ORDER — BUTAMBEN-TETRACAINE-BENZOCAINE 2-2-14 % EX AERO
INHALATION_SPRAY | CUTANEOUS | Status: DC | PRN
  Administered 2017-02-26: 1 via TOPICAL

## 2017-02-26 MED ORDER — SODIUM CHLORIDE 0.9 % IV SOLN
Freq: Once | INTRAVENOUS | Status: AC
  Administered 2017-02-26: 15:00:00 via INTRAVENOUS

## 2017-02-26 MED ORDER — MIDAZOLAM HCL 10 MG/2ML IJ SOLN
INTRAMUSCULAR | Status: DC | PRN
  Administered 2017-02-26: 2 mg via INTRAVENOUS
  Administered 2017-02-26: .5 mg via INTRAVENOUS
  Administered 2017-02-26: 2 mg via INTRAVENOUS
  Administered 2017-02-26: .5 mg via INTRAVENOUS

## 2017-02-26 NOTE — Interval H&P Note (Signed)
History and Physical Interval Note:  02/26/2017 1:06 PM  Adrian Ellis  has presented today for surgery, with the diagnosis of Radiation proctitis and hematochezia  The various methods of treatment have been discussed with the patient and family. After consideration of risks, benefits and other options for treatment, the patient has consented to  Procedure(s): FLEXIBLE SIGMOIDOSCOPY (N/A) as a surgical intervention .  The patient's history has been reviewed, patient examined, no change in status, stable for surgery.  I have reviewed the patient's chart and labs.  Questions were answered to the patient's satisfaction.     Torryn Fiske D

## 2017-02-26 NOTE — Care Management Obs Status (Signed)
[place] NOTIFICATION   Patient Details  Name: Adrian Ellis MRN: [id number] Date of Birth: 1941-05-08   Medicare Observation Status Notification Given:  Yes    Shain Pauwels, Rory Percy, RN 02/26/2017, 4:09 PM

## 2017-02-26 NOTE — Progress Notes (Signed)
Nutrition Brief Note  Patient identified on the Malnutrition Screening Tool (MST) Report. Pt admitted with lower GI bleed  Wt Readings from Last 15 Encounters:  02/25/17 213 lb (96.6 kg)  03/28/16 218 lb (98.9 kg)  03/09/15 214 lb (97.1 kg)  02/25/15 214 lb 1.1 oz (97.1 kg)  08/01/14 221 lb 12.8 oz (100.6 kg)  07/25/14 223 lb 3.2 oz (101.2 kg)  07/17/14 220 lb 1.6 oz (99.8 kg)  07/10/14 223 lb 6.4 oz (101.3 kg)  07/03/14 222 lb 8 oz (100.9 kg)  06/29/14 222 lb 9.6 oz (101 kg)  06/19/14 227 lb 11.2 oz (103.3 kg)  06/12/14 224 lb (101.6 kg)  05/02/14 220 lb 3.2 oz (99.9 kg)  03/09/14 218 lb (98.9 kg)  04/24/13 210 lb 11.2 oz (95.6 kg)   Weight relatively stable per weight encounters; pt reports UBW around 218 pounds; noted pt weighed 214 pounds 2 years ago, current wt 213 pounds.   Body mass index is 28.89 kg/m.   Current diet order is NPO for procedure, patient tolerated 100% of CL tray last night. Pt reports good appetite at home.  Boost Breeze ordered TID, continue at this time. Labs and medications reviewed.   No nutrition interventions warranted at this time. If nutrition issues arise, please consult RD.   Kerman Passey MS, RD, LDN [phone] Pager  [phone] Weekend/On-Call Pager

## 2017-02-26 NOTE — Op Note (Signed)
[facility] Patient Name: Adrian Ellis Procedure Date : 02/26/2017 MRN: [id number] Attending MD: Carol Ada , MD Date of Birth: 11/26/1940 CSN: [id number] Age: 76 Admit Type: Inpatient Procedure:                Upper GI endoscopy Indications:              Hematochezia, Suspected upper gastrointestinal                            bleeding Providers:                Carol Ada, MD, Cleda Daub, RN Referring MD:              Medicines:                See the FFS report Complications:            No immediate complications. Estimated Blood Loss:     Estimated blood loss: none. Procedure:                Pre-Anesthesia Assessment:                           - Prior to the procedure, a History and Physical                            was performed, and patient medications and                            allergies were reviewed. The patient's tolerance of                            previous anesthesia was also reviewed. The risks                            and benefits of the procedure and the sedation                            options and risks were discussed with the patient.                            All questions were answered, and informed consent                            was obtained. Prior Anticoagulants: The patient has                            taken no previous anticoagulant or antiplatelet                            agents. ASA Grade Assessment: II - A patient with                            mild systemic disease. After reviewing the risks  and benefits, the patient was deemed in                            satisfactory condition to undergo the procedure.                           - Sedation was administered by an endoscopy nurse.                            The sedation level attained was moderate.                           After obtaining informed consent, the endoscope was                            passed under direct vision.  Throughout the                            procedure, the patient's blood pressure, pulse, and                            oxygen saturations were monitored continuously. The                            [id number] ([id number]) scope was introduced through the                            mouth, and advanced to the second part of duodenum.                            The upper GI endoscopy was accomplished without                            difficulty. The patient tolerated the procedure                            well. Scope In: Scope Out: Findings:      The esophagus was normal.      Localized mildly erythematous mucosa without bleeding was found in the       gastric antrum.      The examined duodenum was normal.      With the paucity of any overt sources of bleeding during the [facility] and       emergent EGD was performed to ensure that there was no rapid upper GI       source of bleeding. The examination was essential normal, i.e., no       source of bleeding from the upper GI tract. Impression:               - Normal esophagus.                           - Erythematous mucosa in the antrum.                           - Normal examined duodenum.                           -  No specimens collected. Moderate Sedation:      Moderate (conscious) sedation was administered by the endoscopy nurse       and supervised by the endoscopist. The following parameters were       monitored: oxygen saturation, heart rate, blood pressure, and response       to care. Recommendation:           - Return patient to hospital ward for ongoing care.                           - Clear liquid diet.                           - Continue present medications.                           - Follow HGB and transfuse as necessary. Procedure Code(s):        --- Professional ---                           [phone], Esophagogastroduodenoscopy, flexible,                            transoral; diagnostic, including collection of                             specimen(s) by brushing or washing, when performed                            (separate procedure) Diagnosis Code(s):        --- Professional ---                           K31.89, Other diseases of stomach and duodenum                           K92.1, Melena (includes Hematochezia) CPT copyright 2016 [employer]. All rights reserved. The codes documented in this report are preliminary and upon coder review may  be revised to meet current compliance requirements. Carol Ada, MD Carol Ada, MD 02/26/2017 1:59:24 PM This report has been signed electronically. Number of Addenda: 0

## 2017-02-26 NOTE — H&P (View-Only) (Signed)
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  His FFS with APC was performed on 03/09/2015 for hematochezia.  The APC was successful and he did not have any further bleeding until this time.  He reports clots and this was confirmed by the ER physician.  His colonoscopy performed on 02/01/2015 was performed for heme positive stool and several polyps were removed.  There was no evidence of diverticula.  Even though his anemia is very mild, he was feeling light headed, which precipitated the admission.  Past Medical History:  Diagnosis Date  . CKD (chronic kidney disease), stage II   . Hypertension   . Hypothyroidism   . Lower urinary tract symptoms (LUTS)   . Microhematuria    history of, work-up in 2009 negative  . Prostate cancer ([place]) 02/23/14   gleason 3+4=7, volume 25.98 cc  . Prostate enlargement   . Sinus problem   . Thyroid disease     Past Surgical History:  Procedure Laterality Date  . FLEXIBLE SIGMOIDOSCOPY N/A 03/09/2015   Procedure: FLEXIBLE SIGMOIDOSCOPY;  Surgeon: Carol Ada, MD;  Location: [facility];  Service: Endoscopy;  Laterality: N/A;  . PROSTATE BIOPSY  02/23/14   Gleason 7, volume 25.98 cc  . TONSILLECTOMY     age 24    Family History  Problem Relation Age of Onset  . Aneurysm Mother   . Cancer Brother     Social History:  reports that he has quit smoking. He smoked 0.25 packs per day. He has never used smokeless tobacco. He reports that he does not drink alcohol or use drugs.  Allergies: No Known Allergies  Medications:  Scheduled: . amlodipine-olmesartan  1 tablet Oral Daily  . aspirin EC  81 mg Oral Daily  . finasteride  5 mg Oral Daily  . [START ON 02/26/2017] levothyroxine  112 mcg Oral QAC breakfast  . tamsulosin  0.4 mg Oral Daily   Continuous: . sodium  chloride      Results for orders placed or performed during the hospital encounter of 02/25/17 (from the past 24 hour(s))  Comprehensive metabolic panel     Status: Abnormal   Collection Time: 02/25/17  5:19 AM  Result Value Ref Range   Sodium 141 135 - 145 mmol/L   Potassium 3.9 3.5 - 5.1 mmol/L   Chloride 104 101 - 111 mmol/L   CO2 25 22 - 32 mmol/L   Glucose, Bld 118 (H) 65 - 99 mg/dL   BUN 15 6 - 20 mg/dL   Creatinine, Ser 1.22 0.61 - 1.24 mg/dL   Calcium 9.6 8.9 - 10.3 mg/dL   Total Protein 6.7 6.5 - 8.1 g/dL   Albumin 4.0 3.5 - 5.0 g/dL   AST 19 15 - 41 U/L   ALT 21 17 - 63 U/L   Alkaline Phosphatase 57 38 - 126 U/L   Total Bilirubin 0.6 0.3 - 1.2 mg/dL   GFR calc non Af Amer 55 (L) >60 mL/min   GFR calc Af Amer >60 >60 mL/min   Anion gap 12 5 - 15  CBC     Status: Abnormal   Collection Time: 02/25/17  5:19 AM  Result Value Ref Range   WBC 8.3 4.0 - 10.5 K/uL   RBC 4.00 (L) 4.22 - 5.81 MIL/uL   Hemoglobin 11.9 (L) 13.0 - 17.0 g/dL  HCT 36.9 (L) 39.0 - 52.0 %   MCV 92.3 78.0 - 100.0 fL   MCH 29.8 26.0 - 34.0 pg   MCHC 32.2 30.0 - 36.0 g/dL   RDW 13.4 11.5 - 15.5 %   Platelets 296 150 - 400 K/uL  Type and screen [place]     Status: None   Collection Time: 02/25/17  5:19 AM  Result Value Ref Range   ABO/RH(D) B POS    Antibody Screen NEG    Sample Expiration 02/28/2017   POC occult blood, ED     Status: Abnormal   Collection Time: 02/25/17  6:29 AM  Result Value Ref Range   Fecal Occult Bld POSITIVE (A) NEGATIVE  CBC     Status: Abnormal   Collection Time: 02/25/17  9:50 AM  Result Value Ref Range   WBC 10.9 (H) 4.0 - 10.5 K/uL   RBC 3.56 (L) 4.22 - 5.81 MIL/uL   Hemoglobin 10.8 (L) 13.0 - 17.0 g/dL   HCT 32.9 (L) 39.0 - 52.0 %   MCV 92.4 78.0 - 100.0 fL   MCH 30.3 26.0 - 34.0 pg   MCHC 32.8 30.0 - 36.0 g/dL   RDW 13.5 11.5 - 15.5 %   Platelets 291 150 - 400 K/uL  Urinalysis, Complete w Microscopic     Status: Abnormal   Collection  Time: 02/25/17  2:00 PM  Result Value Ref Range   Color, Urine YELLOW YELLOW   APPearance CLEAR CLEAR   Specific Gravity, Urine 1.013 1.005 - 1.030   pH 6.0 5.0 - 8.0   Glucose, UA NEGATIVE NEGATIVE mg/dL   Hgb urine dipstick MODERATE (A) NEGATIVE   Bilirubin Urine NEGATIVE NEGATIVE   Ketones, ur NEGATIVE NEGATIVE mg/dL   Protein, ur NEGATIVE NEGATIVE mg/dL   Nitrite NEGATIVE NEGATIVE   Leukocytes, UA NEGATIVE NEGATIVE   RBC / HPF 0-5 0 - 5 RBC/hpf   WBC, UA 0-5 0 - 5 WBC/hpf   Bacteria, UA RARE (A) NONE SEEN   Squamous Epithelial / LPF 0-5 (A) NONE SEEN   Mucus PRESENT      No results found.  ROS:  As stated above in the HPI otherwise negative.  Blood pressure (!) 161/73, pulse 72, temperature 97.8 F (36.6 C), temperature source Oral, resp. rate 18, height 6' (1.829 m), weight 96.2 kg (212 lb), SpO2 100 %.    PE: Gen: NAD, Alert and Oriented HEENT:  [place]/AT, EOMI Neck: Supple, no LAD Lungs: CTA Bilaterally CV: RRR without M/G/R ABM: Soft, NTND, +BS Ext: No C/C/E  Assessment/Plan: 1) Hematochezia. 2) History of radiation proctitis. 3) Mild anemia.   With his history of bleeding from his radiation proctitis, I suspect that he has recurrent bleeding from this issue.  I will pursue a FFS with APC tomorrow.  Plan: 1) FFS with APC.  Kevan Prouty D 02/25/2017, 3:37 PM

## 2017-02-26 NOTE — Progress Notes (Signed)
[facility] paged that patient expelled 385ml from his rectum and almost had a synocopy episode. Patient vital were taken but NT had not recorded them when paged. Labs were drawn. Arthor Captain LPN

## 2017-02-26 NOTE — Op Note (Signed)
[facility] Patient Name: Adrian Ellis Procedure Date : 02/26/2017 MRN: [id number] Attending MD: Carol Ada , MD Date of Birth: 12/20/1940 CSN: [id number] Age: 76 Admit Type: Inpatient Procedure:                Flexible Sigmoidoscopy Indications:              Hematochezia Providers:                Carol Ada, MD, Cleda Daub, RN, Cherylynn Ridges,                            Technician, Carol Ada, MD Referring MD:              Medicines:                Fentanyl 50 micrograms IV, Midazolam 5 mg IV Complications:            No immediate complications. Estimated Blood Loss:     Estimated blood loss: none. Procedure:                Pre-Anesthesia Assessment:                           - Prior to the procedure, a History and Physical                            was performed, and patient medications and                            allergies were reviewed. The patient's tolerance of                            previous anesthesia was also reviewed. The risks                            and benefits of the procedure and the sedation                            options and risks were discussed with the patient.                            All questions were answered, and informed consent                            was obtained. Prior Anticoagulants: The patient has                            taken no previous anticoagulant or antiplatelet                            agents. ASA Grade Assessment: II - A patient with                            mild systemic disease. After reviewing the risks  and benefits, the patient was deemed in                            satisfactory condition to undergo the procedure.                           - Sedation was administered by an anesthesia                            professional. Deep sedation was attained.                           After obtaining informed consent, the scope was                            passed under  direct vision. The [id number] ([id number])                            scope was introduced through the anus and advanced                            to the the descending colon. The flexible                            sigmoidoscopy was accomplished without difficulty.                            The patient tolerated the procedure well. The                            quality of the bowel preparation was adequate. Scope In: 1:21:23 PM Scope Out: 1:29:09 PM Total Procedure Duration: 0 hours 7 minutes 46 seconds  Findings:      Red blood was found in the rectum, in the sigmoid colon and in the       descending colon.      There was no evidence of radiation proctitis or any bleeding source from       the rectum. There were no overt diverticula, but an [facility]BE report from       2005 reported diverticula, but the location of the diverticula were not       specified. Fifty percent of diverticular bleeds are from the proximal       colon. It is possible that he is having a right sided diverticular       bleed, however, a rapid UGI source of bleeding needs to be excluded in       this situation. Impression:               - Blood in the rectum, in the sigmoid colon and in                            the descending colon.                           - No specimens collected. Recommendation:           - Perform  an emergent EGD to exclude a rapid upper                            GI source of bleeding. Procedure Code(s):        --- Professional ---                           [phone], Sigmoidoscopy, flexible; diagnostic,                            including collection of specimen(s) by brushing or                            washing, when performed (separate procedure) Diagnosis Code(s):        --- Professional ---                           K62.5, Hemorrhage of anus and rectum                           K92.2, Gastrointestinal hemorrhage, unspecified                           K92.1, Melena (includes Hematochezia) CPT  copyright 2016 American Medical Association. All rights reserved. The codes documented in this report are preliminary and upon coder review may  be revised to meet current compliance requirements. Carol Ada, MD Carol Ada, MD 02/26/2017 2:05:16 PM This report has been signed electronically. Number of Addenda: 0

## 2017-02-26 NOTE — Progress Notes (Signed)
PROGRESS NOTE    Adrian Ellis  NID:[id number] DOB: Dec 10, 1940 DOA: 02/25/2017 PCP: Iona Beard, MD   Brief Narrative: 76 y.o. male with medical history significant for CAD, hypertension, hypothyroidism, prostate cancer and prostate enlargement, hypothyroidism, presenting with rectal bleeding. Patient with history of hematochezia secondary to his radiation proctitis. Evaluated by GI and admitted for further evaluation.  Assessment & Plan:  # Hematochezia likely secondary to radiation proctitis: -Patient with significant hemoglobin drop. Evaluated by GI. Currently nothing by mouth for sigmoidoscopy today. I called and discussed with Dr. Benson Norway from GI regarding significant hemoglobin drop, symptomatic anemia and hematochezia.  -Discontinue aspirin -Follow-up GI for further plan. Patient is currently nothing by mouth.  #Acute blood loss anemia/symptomatically anemia: Patient with weakness and lightheadedness. Also borderline low blood pressure. Plan to transfuse 2 units of red blood cell transfusion. Discussed with the patient and he verbalized understanding. Continue monitor CBC every 6 hourly.  # History of hypertension: Blood pressure is borderline low with GI bleed. Holding antihypertensive medication. Continue to monitor.   #Hypothyroidism: Continue Synthroid.  #History of prostate cancer and BPH: Recommended outpatient follow-up. Continue Flomax.  #Chronic kidney disease is just too: Serum creatinine level around baseline. Continue monitor.  DVT prophylaxis:SCD Code Status: full code  Family Communication: no family at bedside  Disposition Plan:Currently admitted    Consultants:   GI   Procedures: sigmoidoscopy planned for today  Antimicrobials:None   Subjective: Seen and examined at bedside. Has weakness, mild dizziness and hematochezia. Denied chest pain, shortness of breath, nausea vomiting or abdominal pain.   Objective: Vitals:   02/26/17 0301 02/26/17 0324  02/26/17 0519 02/26/17 1000  BP: (!) 91/45 (!) 106/52 (!) 110/44 (!) 120/41  Pulse: 66  82 76  Resp: 18  16 18   Temp: 98 F (36.7 C) 99.3 F (37.4 C) 98.2 F (36.8 C) 98 F (36.7 C)  TempSrc: Oral Oral Oral Oral  SpO2: 98% 99% 100% 100%  Weight:      Height:        Intake/Output Summary (Last 24 hours) at 02/26/17 1136 Last data filed at 02/26/17 0900  Gross per 24 hour  Intake              240 ml  Output              351 ml  Net             -111 ml   Filed Weights   02/25/17 0514 02/25/17 2127  Weight: 96.2 kg (212 lb) 96.6 kg (213 lb)    Examination:  General exam: Appears calm and comfortable  Respiratory system: Clear to auscultation. Respiratory effort normal. No wheezing or crackle Cardiovascular system: S1 & S2 heard, RRR.  No pedal edema. Gastrointestinal system: Abdomen is nondistended, soft and nontender. Normal bowel sounds heard. Central nervous system: Alert and oriented. No focal neurological deficits. Extremities: Symmetric 5 x 5 power. Skin: No rashes, lesions or ulcers Psychiatry: Judgement and insight appear normal. Mood & affect appropriate.     Data Reviewed: I have personally reviewed following labs and imaging studies  CBC:  Recent Labs Lab 02/25/17 0519 02/25/17 0950 02/26/17 0241 02/26/17 0919  WBC 8.3 10.9* 11.8* 9.2  HGB 11.9* 10.8* 8.5* 7.5*  HCT 36.9* 32.9* 26.2* 23.1*  MCV 92.3 92.4 92.6 92.4  PLT 296 291 229 144   Basic Metabolic Panel:  Recent Labs Lab 02/25/17 0519 02/26/17 0241  NA 141 136  K 3.9 3.7  CL 104  106  CO2 25 24  GLUCOSE 118* 143*  BUN 15 13  CREATININE 1.22 1.26*  CALCIUM 9.6 8.0*   GFR: Estimated Creatinine Clearance: 59.2 mL/min (A) (by C-G formula based on SCr of 1.26 mg/dL (H)). Liver Function Tests:  Recent Labs Lab 02/25/17 0519 02/26/17 0241  AST 19 17  ALT 21 16*  ALKPHOS 57 40  BILITOT 0.6 0.9  PROT 6.7 5.0*  ALBUMIN 4.0 3.1*   No results for input(s): LIPASE, AMYLASE in the  last 168 hours. No results for input(s): AMMONIA in the last 168 hours. Coagulation Profile:  Recent Labs Lab 02/26/17 0241  INR 1.13   Cardiac Enzymes: No results for input(s): CKTOTAL, CKMB, CKMBINDEX, TROPONINI in the last 168 hours. BNP (last 3 results) No results for input(s): PROBNP in the last 8760 hours. HbA1C: No results for input(s): HGBA1C in the last 72 hours. CBG: No results for input(s): GLUCAP in the last 168 hours. Lipid Profile: No results for input(s): CHOL, HDL, LDLCALC, TRIG, CHOLHDL, LDLDIRECT in the last 72 hours. Thyroid Function Tests: No results for input(s): TSH, T4TOTAL, FREET4, T3FREE, THYROIDAB in the last 72 hours. Anemia Panel: No results for input(s): VITAMINB12, FOLATE, FERRITIN, TIBC, IRON, RETICCTPCT in the last 72 hours. Sepsis Labs: No results for input(s): PROCALCITON, LATICACIDVEN in the last 168 hours.  No results found for this or any previous visit (from the past 240 hour(s)).       Radiology Studies: No results found.      Scheduled Meds: . aspirin EC  81 mg Oral Daily  . feeding supplement  1 Container Oral TID BM  . finasteride  5 mg Oral Daily  . irbesartan  150 mg Oral Daily  . levothyroxine  112 mcg Oral QAC breakfast  . tamsulosin  0.4 mg Oral Daily   Continuous Infusions: . sodium chloride 100 mL/hr (02/26/17 0238)  . sodium chloride    . sodium chloride       LOS: 0 days    Adrian Augenstein Tanna Furry, MD [facility] Pager [phone]  If 7PM-7AM, please contact night-coverage [email or web address] Password [email or web address] 02/26/2017, 11:36 AM

## 2017-02-27 ENCOUNTER — Encounter (HOSPITAL_COMMUNITY): Payer: Self-pay | Admitting: Gastroenterology

## 2017-02-27 ENCOUNTER — Inpatient Hospital Stay (HOSPITAL_COMMUNITY): Payer: Medicare Other

## 2017-02-27 LAB — CBC
HCT: 27.3 % — ABNORMAL LOW (ref 39.0–52.0)
HEMATOCRIT: 28.4 % — AB (ref 39.0–52.0)
HEMATOCRIT: 22.1 % — AB (ref 39.0–52.0)
HEMOGLOBIN: 9 g/dL — AB (ref 13.0–17.0)
Hemoglobin: 9.6 g/dL — ABNORMAL LOW (ref 13.0–17.0)
Hemoglobin: 7.2 g/dL — ABNORMAL LOW (ref 13.0–17.0)
MCH: 30.6 pg (ref 26.0–34.0)
MCH: 30.1 pg (ref 26.0–34.0)
MCH: 29.8 pg (ref 26.0–34.0)
MCHC: 33.8 g/dL (ref 30.0–36.0)
MCHC: 33 g/dL (ref 30.0–36.0)
MCHC: 32.6 g/dL (ref 30.0–36.0)
MCV: 90.4 fL (ref 78.0–100.0)
MCV: 91.3 fL (ref 78.0–100.0)
MCV: 91.3 fL (ref 78.0–100.0)
PLATELETS: 214 10*3/uL (ref 150–400)
Platelets: 205 10*3/uL (ref 150–400)
Platelets: 180 10*3/uL (ref 150–400)
RBC: 3.14 MIL/uL — AB (ref 4.22–5.81)
RBC: 2.99 MIL/uL — AB (ref 4.22–5.81)
RBC: 2.42 MIL/uL — AB (ref 4.22–5.81)
RDW: 14.4 % (ref 11.5–15.5)
RDW: 14.6 % (ref 11.5–15.5)
RDW: 14.1 % (ref 11.5–15.5)
WBC: 10.1 10*3/uL (ref 4.0–10.5)
WBC: 9.5 10*3/uL (ref 4.0–10.5)
WBC: 8 10*3/uL (ref 4.0–10.5)

## 2017-02-27 LAB — PREPARE RBC (CROSSMATCH)

## 2017-02-27 MED ORDER — SODIUM CHLORIDE 0.9 % IV SOLN: Freq: Once | INTRAVENOUS | Status: DC

## 2017-02-27 MED ORDER — IOPAMIDOL (ISOVUE-370) INJECTION 76%
INTRAVENOUS | Status: AC
  Administered 2017-02-27: 100 mL
  Filled 2017-02-27: qty 100

## 2017-02-27 MED ORDER — AMLODIPINE BESYLATE 5 MG PO TABS
5.0000 mg | ORAL_TABLET | Freq: Every day | ORAL | Status: DC
  Administered 2017-02-27: 5 mg via ORAL
  Filled 2017-02-27: qty 1

## 2017-02-27 NOTE — Progress Notes (Signed)
Patient with drop in Hb from 9.0 to 7.2 due to GIB. Likely active diverticular bleed.  -Contacted Dr. Benson Norway from GI who recommended to contact on call, Dr. Collene Mares. Message left to Dr. Collene Mares. -ordered 2 units of PRBC -stat bleeding scan ordered -will keep npo -monitor CBC frequently and transfuse as needed. -will talk to night covering provider to follow up the test  Discussed with patient's RN.

## 2017-02-27 NOTE — Progress Notes (Signed)
Subjective: No new complaints.  He had some minor hematochezia when he was urinating last evening.  Objective: Vital signs in last 24 hours: Temp:  [97.5 F (36.4 C)-98.4 F (36.9 C)] 98.2 F (36.8 C) (10/12 0424) Pulse Rate:  [72-88] 75 (10/12 0424) Resp:  [11-20] 19 (10/12 0424) BP: (102-185)/(41-75) 138/57 (10/12 0424) SpO2:  [96 %-100 %] 100 % (10/12 0424) Last BM Date: 02/26/17  Intake/Output from previous day: 10/11 0701 - 10/12 0700 In: 2045 [P.O.:240; I.V.:1200; Blood:605] Out: 700 [Urine:700] Intake/Output this shift: Total I/O In: -  Out: 725 [Urine:725]  General appearance: alert and no distress GI: soft, non-tender; bowel sounds normal; no masses,  no organomegaly  Lab Results:  Recent Labs  02/26/17 0919 02/26/17 2057 02/27/17 0346  WBC 9.2 9.3 10.1  HGB 7.5* 9.4* 9.6*  HCT 23.1* 28.7* 28.4*  PLT 209 196 214   BMET  Recent Labs  02/25/17 0519 02/26/17 0241  NA 141 136  K 3.9 3.7  CL 104 106  CO2 25 24  GLUCOSE 118* 143*  BUN 15 13  CREATININE 1.22 1.26*  CALCIUM 9.6 8.0*   LFT  Recent Labs  02/26/17 0241  PROT 5.0*  ALBUMIN 3.1*  AST 17  ALT 16*  ALKPHOS 40  BILITOT 0.9   PT/INR  Recent Labs  02/26/17 0241  LABPROT 14.4  INR 1.13   Hepatitis Panel No results for input(s): HEPBSAG, HCVAB, HEPAIGM, HEPBIGM in the last 72 hours. C-Diff No results for input(s): CDIFFTOX in the last 72 hours. Fecal Lactopherrin No results for input(s): FECLLACTOFRN in the last 72 hours.  Studies/Results: No results found.  Medications:  Scheduled: . feeding supplement  1 Container Oral TID BM  . finasteride  5 mg Oral Daily  . levothyroxine  112 mcg Oral QAC breakfast  . tamsulosin  0.4 mg Oral Daily   Continuous: . sodium chloride 100 mL/hr at 02/26/17 2028    Assessment/Plan: 1) Probable proximal diverticular bleed. 2) Anemia.   Overall the patient is stable.  His minor hematochezia was most likely from the [facility] and retained  water.  Washing was required during the procedure.  No further bleeding at this time.  I suspect that he has a right sided diverticular bleed lesion.  If bleeding continues a bleeding scan is recommended.  Plan: 1) Follow HGB. 2) Transfuse as necessary. 3) Follow for another 24 hours since his bleeding was rather severe. 4) Bleeding scan if bleeding occurs again.  LOS: 1 day   Maria Gallicchio D 02/27/2017, 8:59 AM

## 2017-02-27 NOTE — Progress Notes (Signed)
Pt. having small amt. rectal bleeding in BSC mixed with urine.

## 2017-02-27 NOTE — Plan of Care (Signed)
Problem: Fluid Volume: Goal: Ability to maintain a balanced intake and output will improve Outcome: Not Progressing Pt continuing to have large amounts of rectal bleeding.  Pt aware that we will continue to monitor hemoglobin.  Pt was educated about safety and asked to call for help when getting out of bed.  Problem: Nutrition: Goal: Adequate nutrition will be maintained Outcome: Progressing Pt was encouraged to continuing proper nutrition

## 2017-02-27 NOTE — Plan of Care (Signed)
Problem: Education: Goal: Knowledge of [facility] Education information/materials will improve Outcome: Progressing POC reviewed with pt.   

## 2017-02-27 NOTE — Progress Notes (Signed)
PROGRESS NOTE    Adrian Ellis  UXL:[id number] DOB: May 21, 1940 DOA: 02/25/2017 PCP: Iona Beard, MD   Brief Narrative: 76 y.o. male with medical history significant for CAD, hypertension, hypothyroidism, prostate cancer and prostate enlargement, hypothyroidism, presenting with rectal bleeding. Patient with history of hematochezia secondary to his radiation proctitis. Evaluated by GI and admitted for further evaluation.  Assessment & Plan:  # Hematochezia likely secondary to diverticular bleed: -Sigmoidoscopy and upper GI endoscopy showed no source of bleeding likely proximal diverticular bleed as per GI. Received 2 L of blood transfusion. Hemoglobin was stable this morning. Patient has minor bleeding likely old blood. Continue to monitor CBC. If patient continues to have bleeding or declining in hemoglobin consider bleeding scan. -Discontinue aspirin -Follow-up GI for further plan.   #Acute blood loss anemia/symptomatically anemia: -Receiving 2 units ordered blood cell transfusion. Monitor CBC.   # History of hypertension: Resume low-dose amlodipine. Monitor blood pressure.  #Hypothyroidism: Continue Synthroid.  #History of prostate cancer and BPH: Recommended outpatient follow-up. Continue Flomax.  #Chronic kidney disease is just too: Serum creatinine level around baseline. Continue monitor.  DVT prophylaxis:SCD Code Status: full code  Family Communication: no family at bedside  Disposition Plan:Currently admitted    Consultants:   GI   Procedures: sigmoidoscopy planned for today  Antimicrobials:None   Subjective: Seen and examined at bedside. Denied headache, dizziness, nausea vomiting chest pain or shortness of breath. Has minimal blood, no BM today  Objective: Vitals:   02/26/17 2037 02/27/17 0424 02/27/17 0935 02/27/17 1300  BP: (!) 129/54 (!) 138/57 (!) 131/55 (!) 157/60  Pulse: 73 75 65 84  Resp: 18 19 18 18   Temp: 98.2 F (36.8 C) 98.2 F (36.8 C) 97.8 F  (36.6 C)   TempSrc: Oral Oral Oral   SpO2: 100% 100% 97% 100%  Weight:      Height:        Intake/Output Summary (Last 24 hours) at 02/27/17 1422 Last data filed at 02/27/17 1400  Gross per 24 hour  Intake             3102 ml  Output             2025 ml  Net             1077 ml   Filed Weights   02/25/17 0514 02/25/17 2127  Weight: 96.2 kg (212 lb) 96.6 kg (213 lb)    Examination:  General exam: NAD Respiratory system: Clear bilaterally, respiratory effort normal. No wheezing Cardiovascular system: Regular rate rhythm S1-S2 normal. No pedal edema. Gastrointestinal system: Abdomen soft, nontender, nondistended. Bowel sound positive. Central nervous system: Alert and oriented. No focal neurological deficits. Skin: No rashes, lesions or ulcers Psychiatry: Judgement and insight appear normal. Mood & affect appropriate.     Data Reviewed: I have personally reviewed following labs and imaging studies  CBC:  Recent Labs Lab 02/26/17 0241 02/26/17 0919 02/26/17 2057 02/27/17 0346 02/27/17 1059  WBC 11.8* 9.2 9.3 10.1 9.5  HGB 8.5* 7.5* 9.4* 9.6* 9.0*  HCT 26.2* 23.1* 28.7* 28.4* 27.3*  MCV 92.6 92.4 91.1 90.4 91.3  PLT 229 209 196 214 536   Basic Metabolic Panel:  Recent Labs Lab 02/25/17 0519 02/26/17 0241  NA 141 136  K 3.9 3.7  CL 104 106  CO2 25 24  GLUCOSE 118* 143*  BUN 15 13  CREATININE 1.22 1.26*  CALCIUM 9.6 8.0*   GFR: Estimated Creatinine Clearance: 59.2 mL/min (A) (by C-G formula based on  SCr of 1.26 mg/dL (H)). Liver Function Tests:  Recent Labs Lab 02/25/17 0519 02/26/17 0241  AST 19 17  ALT 21 16*  ALKPHOS 57 40  BILITOT 0.6 0.9  PROT 6.7 5.0*  ALBUMIN 4.0 3.1*   No results for input(s): LIPASE, AMYLASE in the last 168 hours. No results for input(s): AMMONIA in the last 168 hours. Coagulation Profile:  Recent Labs Lab 02/26/17 0241  INR 1.13   Cardiac Enzymes: No results for input(s): CKTOTAL, CKMB, CKMBINDEX, TROPONINI  in the last 168 hours. BNP (last 3 results) No results for input(s): PROBNP in the last 8760 hours. HbA1C: No results for input(s): HGBA1C in the last 72 hours. CBG: No results for input(s): GLUCAP in the last 168 hours. Lipid Profile: No results for input(s): CHOL, HDL, LDLCALC, TRIG, CHOLHDL, LDLDIRECT in the last 72 hours. Thyroid Function Tests: No results for input(s): TSH, T4TOTAL, FREET4, T3FREE, THYROIDAB in the last 72 hours. Anemia Panel: No results for input(s): VITAMINB12, FOLATE, FERRITIN, TIBC, IRON, RETICCTPCT in the last 72 hours. Sepsis Labs: No results for input(s): PROCALCITON, LATICACIDVEN in the last 168 hours.  No results found for this or any previous visit (from the past 240 hour(s)).       Radiology Studies: No results found.      Scheduled Meds: . feeding supplement  1 Container Oral TID BM  . finasteride  5 mg Oral Daily  . levothyroxine  112 mcg Oral QAC breakfast  . tamsulosin  0.4 mg Oral Daily   Continuous Infusions: . sodium chloride 100 mL/hr at 02/26/17 2028     LOS: 1 day    Izaiha Lo Tanna Furry, MD [facility] Pager [phone]  If 7PM-7AM, please contact night-coverage [email or web address] Password [email or web address] 02/27/2017, 2:22 PM

## 2017-02-27 NOTE — Progress Notes (Addendum)
Pt has had several episodes of rectal bleeding.  About 300 cc per episode.  Several large clot with each episode.  Pt denies any pain or dizziness.

## 2017-02-27 NOTE — Progress Notes (Signed)
Pt receiving 1 unit of PRBC pt passed large amount of blood with clots pt stated he felt like he was going to pass out,VS 141/59 heart rate 71,resp 14,sats 100% on room air,DR [facility] notified and rapid response notified will continue to monitor

## 2017-02-27 NOTE — Progress Notes (Signed)
Pt had 139ml in bladder after urinating.

## 2017-02-27 NOTE — Progress Notes (Signed)
Disregard last note. Written in error.

## 2017-02-27 NOTE — Consult Note (Signed)
           [facility] CM Primary Care Navigator  02/27/2017  Adrian Ellis 16-Nov-1940 [id number]   Seen patient at the bedside to identify possible discharge needs.  Patient reports having "bloody stools" (bright red) that had led to this admission.  Patient end[facility].   Patient statesusing [employer] on [facility] and [facility] service to obtain medications without difficulty. Patient reports managing his ownmedications at home straight out of the containers ("not taking much").   Patient reports that he has beendriving prior to admission but his niece ([name])can providetransportation to his doctors' appointments after discharge if needed.  Patient reports that he lives alone and is theprimary caregiver for himself. He mentioned that his niece [name]) can be able to help with his care needs as needed.  Anticipated discharge plan is home when improved per patient.  Patientexpressed understanding to callprimary care provider's officewhen he returnshome for a post discharge follow-up appointment within a week or sooner if needed. Patient letter (with PCP's contact number) was provided as a reminder.  Explained to patient about [facility] CM services available for health management at home but he communicated no needs or concerns at this point.  Patientvoiced understanding to seek referral from primary care provider to [facility] care management if necessary and appropriate for services in the future.  [facility] care management information provided for future needs that may arise.  Patient had verbally agreed and opted for [facility]MI calls to follow-up his recovery at home.  Referral made to [place] calls after discharge.   For questions, please contact:  Dannielle Huh, BSN, RN- [facility] Primary Care Navigator  Telephone: [phone] [place]

## 2017-02-28 DIAGNOSIS — K551 Chronic vascular disorders of intestine: Secondary | ICD-10-CM

## 2017-02-28 DIAGNOSIS — I771 Stricture of artery: Secondary | ICD-10-CM

## 2017-02-28 DIAGNOSIS — I774 Celiac artery compression syndrome: Secondary | ICD-10-CM

## 2017-02-28 DIAGNOSIS — K922 Gastrointestinal hemorrhage, unspecified: Secondary | ICD-10-CM

## 2017-02-28 DIAGNOSIS — I10 Essential (primary) hypertension: Secondary | ICD-10-CM

## 2017-02-28 LAB — CBC
HCT: 29.3 % — ABNORMAL LOW (ref 39.0–52.0)
HEMATOCRIT: 29.1 % — AB (ref 39.0–52.0)
Hemoglobin: 10.1 g/dL — ABNORMAL LOW (ref 13.0–17.0)
Hemoglobin: 9.8 g/dL — ABNORMAL LOW (ref 13.0–17.0)
MCH: 31 pg (ref 26.0–34.0)
MCH: 30.2 pg (ref 26.0–34.0)
MCHC: 34.5 g/dL (ref 30.0–36.0)
MCHC: 33.7 g/dL (ref 30.0–36.0)
MCV: 89.9 fL (ref 78.0–100.0)
MCV: 89.5 fL (ref 78.0–100.0)
PLATELETS: 161 10*3/uL (ref 150–400)
Platelets: 171 10*3/uL (ref 150–400)
RBC: 3.26 MIL/uL — AB (ref 4.22–5.81)
RBC: 3.25 MIL/uL — AB (ref 4.22–5.81)
RDW: 14.4 % (ref 11.5–15.5)
RDW: 14.4 % (ref 11.5–15.5)
WBC: 11.9 10*3/uL — AB (ref 4.0–10.5)
WBC: 11.8 10*3/uL — AB (ref 4.0–10.5)

## 2017-02-28 MED ORDER — AMLODIPINE BESYLATE 5 MG PO TABS
5.0000 mg | ORAL_TABLET | Freq: Every day | ORAL | Status: DC
  Administered 2017-02-28 – 2017-03-03 (×4): 5 mg via ORAL
  Filled 2017-02-28 (×4): qty 1

## 2017-02-28 NOTE — Consult Note (Signed)
Hospital Consult    Reason for Consult:  Mesenteric stenosis Referring Physician:  Dr. Carolin Sicks MRN #:  [id number]  History of Present Illness: This is a 76 y.o. male is admitted with GI bleed is likely diverticular in nature. 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 From a mesenteric ischemia standpoint he does have a weight loss of 6 pounds recently that he cannot attribute to anything. He does not have any issue with eating specifically no food fear and no postprandial pain. He is a former smoker having quit 15 years ago and also has hypertension as risk factors for his vascular disease. He does take aspirin although this is currently held for his GI bleeding.  Past Medical History:  Diagnosis Date  . Arthritis    "right shoulder" (02/25/2017)  . CKD (chronic kidney disease), stage II   . GERD (gastroesophageal reflux disease)   . Hypertension   . Hypothyroidism   . Lower urinary tract symptoms (LUTS)   . Microhematuria    history of, work-up in 2009 negative  . Prostate cancer ([place]) 02/23/14   gleason 3+4=7, volume 25.98 cc  . Prostate enlargement   . Sinus problem   . Thyroid disease     Past Surgical History:  Procedure Laterality Date  . CATARACT EXTRACTION W/ INTRAOCULAR LENS  IMPLANT, BILATERAL Bilateral   . COLONOSCOPY  2004  . ESOPHAGOGASTRODUODENOSCOPY N/A 02/26/2017   Procedure: ESOPHAGOGASTRODUODENOSCOPY (EGD);  Surgeon: Carol Ada, MD;  Location: [facility];  Service: Endoscopy;  Laterality: N/A;  . FLEXIBLE SIGMOIDOSCOPY N/A 03/09/2015   Procedure: FLEXIBLE SIGMOIDOSCOPY;  Surgeon: Carol Ada, MD;  Location: [facility];  Service: Endoscopy;  Laterality: N/A;  . FLEXIBLE SIGMOIDOSCOPY N/A 02/26/2017   Procedure: FLEXIBLE SIGMOIDOSCOPY;  Surgeon: Carol Ada, MD;  Location: [facility];  Service: Endoscopy;  Laterality: N/A;  . PROSTATE BIOPSY  02/23/2014   Gleason 7, volume 25.98 cc  . TONSILLECTOMY  1953    No Known Allergies  Prior to Admission medications   Medication Sig Start Date End Date Taking? Authorizing Provider  amlodipine-olmesartan (AZOR) 10-20 MG tablet Take 1 tablet by mouth daily.   Yes [provider]  aspirin 81 MG chewable tablet Chew 81 mg by mouth daily.   Yes [provider]  finasteride (PROSCAR) 5 MG tablet Take 5 mg by mouth daily.   Yes [provider]  levothyroxine (SYNTHROID, LEVOTHROID) 112 MCG tablet Take 112 mcg by mouth daily before breakfast.   Yes [provider]  Multiple Vitamin (MULTIVITAMIN WITH MINERALS) TABS Take 1 tablet by mouth daily.   Yes [provider]  tamsulosin (FLOMAX) 0.4 MG CAPS capsule Take 0.4 mg by mouth daily.   Yes [provider]  amLODipine (NORVASC) 10 MG tablet Take 1 tablet (10 mg total) by mouth daily. Patient not taking: Reported on 02/25/2017 02/26/15   Reyne Dumas, MD  esomeprazole (NEXIUM) 40 MG packet Take 40 mg by mouth daily before breakfast. Patient not taking: Reported on 02/25/2017 02/26/15   Reyne Dumas, MD    Social History   Social History  . Marital status: Widowed    Spouse name: N/A  . Number of children: N/A  . Years of education: N/A   Occupational History  . Not on file.   Social History Main Topics  . Smoking status: Former Smoker    Packs/day: 0.25    Years: 30.00  Types: Cigarettes  . Smokeless tobacco: Never Used     Comment: 02/25/2017 'quit smoking in the 1990s"  . Alcohol use No  . Drug use: No  . Sexual activity: Not on file   Other Topics Concern  . Not on file   Social History Narrative  . No narrative on file    Family History  Problem Relation Age of Onset  . Aneurysm Mother   . Cancer Brother     REVIEW OF SYSTEMS (negative unless checked):   Cardiac:  []  Chest pain or chest pressure? []  Shortness of breath upon activity? []  Shortness of breath when  lying flat? []  Irregular heart rhythm?  Vascular:  []  Pain in calf, thigh, or hip brought on by walking? []  Pain in feet at night that wakes you up from your sleep? []  Blood clot in your veins? []  Leg swelling?  Pulmonary:  []  Oxygen at home? []  Productive cough? []  Wheezing?  Neurologic:  []  Sudden weakness in arms or legs? []  Sudden numbness in arms or legs? []  Sudden onset of difficult speaking or slurred speech? []  Temporary loss of vision in one eye? []  Problems with dizziness?  Gastrointestinal:  [x]  Blood in stool? []  Vomited blood?  Genitourinary:  []  Burning when urinating? []  Blood in urine?  Psychiatric:  []  Major depression  Hematologic:  []  Bleeding problems? []  Problems with blood clotting?  Dermatologic:  []  Rashes or ulcers?  Constitutional:  []  Fever or chills?  Ear/Nose/Throat:  []  Change in hearing? []  Nose bleeds? []  Sore throat?  Musculoskeletal:  []  Back pain? []  Joint pain? []  Muscle pain?   Physical Examination  Vitals:   02/28/17 0545 02/28/17 0930  BP: (!) 163/62 (!) 144/53  Pulse: 63 69  Resp: 18 19  Temp: 98 F (36.7 C) 98.1 F (36.7 C)  SpO2: 100% 100%   Body mass index is 28.7 kg/m.  General:  WDWN in NAD HENT: WNL, normocephalic Pulmonary: normal non-labored breathing, without Rales, rhonchi,  wheezing Cardiac: regular rate with palpable radial pulses Abdomen:  soft, NT/ND, no masses Extremities: no cce Musculoskeletal: no muscle wasting or atrophy  Neurologic: A&O X 3; Appropriate Affect ; SENSATION: normal; MOTOR FUNCTION:  moving all extremities equally. Speech is fluent/normal Psychiatric:  Appropriate mood and affect  CBC    Component Value Date/Time   WBC 11.9 (H) 02/28/2017 0958   RBC 3.26 (L) 02/28/2017 0958   HGB 10.1 (L) 02/28/2017 0958   HCT 29.3 (L) 02/28/2017 0958   PLT 161 02/28/2017 0958   MCV 89.9 02/28/2017 0958   MCH 31.0 02/28/2017 0958   MCHC 34.5 02/28/2017 0958   RDW 14.4  02/28/2017 0958   LYMPHSABS 1.5 04/23/2013 1010   MONOABS 0.4 04/23/2013 1010   EOSABS 0.2 04/23/2013 1010   BASOSABS 0.0 04/23/2013 1010    BMET    Component Value Date/Time   NA 136 02/26/2017 0241   K 3.7 02/26/2017 0241   CL 106 02/26/2017 0241   CO2 24 02/26/2017 0241   GLUCOSE 143 (H) 02/26/2017 0241   BUN 13 02/26/2017 0241   CREATININE 1.26 (H) 02/26/2017 0241   CALCIUM 8.0 (L) 02/26/2017 0241   GFRNONAA 53 (L) 02/26/2017 0241   GFRAA >60 02/26/2017 0241    COAGS: Lab Results  Component Value Date   INR 1.13 02/26/2017   INR 1.03 02/25/2015     CT IMPRESSION: VASCULAR  1. Extensive atherosclerosis of the aorta and its major branches. No aneurysm or occlusion. No  dissection. 2. 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 IMA is patent.  NON-VASCULAR  No acute findings in the abdomen or pelvis. Small hiatal hernia. No CT evidence of bowel ischemia, inflammation or obstruction.  ASSESSMENT/PLAN: This is a 76 y.o. 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 Given that he is admitted for GI bleeding and does not exhibit symptoms of mesenteric ischemia at this time however him follow up with a duplex as an outpatient to evaluate the hemodynamic consequences of his visceral vessel stenosis. He should be on aspirin when able from a GI bleeding standpoint and also statin should he be able to tolerate.  Laporchia Nakajima C. Donzetta Matters, MD [facility] Office: [phone] Pager: [phone]

## 2017-02-28 NOTE — Progress Notes (Signed)
Spoke with Dr. Carolin Sicks concerning patient at approximately 7:10 pm. Advised to review chart for GI Scan results. Paged by RN at 9:13pm regarding patient status. States pt had large bloody BM and with c/o of dizziness and feeling faint. Bleeding scan still not performed. Advised nurse to call radiology for follow up. GI, Dr. Collene Mares, paged to discuss patient care.  RN paged again at [facility] stating staff not in hospital to perform bleeding scan and will need 45 minutes to get on campus. Dr Collene Mares returned paged and advised a CT angio of Abdomen and to cancel bleeding scan. Patient has not had any more bloody BM. VSS. No more episodes of dizziness and feeling faint. Patient still receiving 2 units of PRBCs. Will follow up on patient throughout the night and continue to check H/H.   Lovey Newcomer, NP [facility]  [phone]

## 2017-02-28 NOTE — Progress Notes (Signed)
RN called for pt passing large blood clots while receiving ine unit of PRBC's. Pt reported he felt like he was going to "pass out." BP 141/49, HR 71, RR 14, 100% RA, alert and oriented x4. Advised to page MD. No interventions from RTT. Encouraged to call with any further assistance.

## 2017-02-28 NOTE — Progress Notes (Signed)
Subjective: Events over the last 24 hours noted. Patient's had significant hematochezia requiring blood transfusions last night. CT angiogram, done emergently last night, revealed extensive atherosclerosis of the aorta with no aneurysmal dilatation or occlusion high-grade stenosis of the region of the celiac axis was noted with moderate stenosis of the region of the SMA and IMA was patent. Patient presumed to have a diverticular bleed that has since resolved. He's had no rectal bleeding since this morning. He denies having any abdominal pain nausea vomiting and is tolerating clears well.  Objective: Vital signs in last 24 hours: Temp:  [97.8 F (36.6 C)-98.7 F (37.1 C)] 98 F (36.7 C) (10/13 0545) Pulse Rate:  [63-84] 63 (10/13 0545) Resp:  [14-18] 18 (10/13 0545) BP: (115-163)/(45-62) 163/62 (10/13 0545) SpO2:  [97 %-100 %] 100 % (10/13 0545) Weight:  [96 kg (211 lb 10.3 oz)] 96 kg (211 lb 10.3 oz) (10/12 2020) Last BM Date: 02/27/17  Intake/Output from previous day: 10/12 0701 - 10/13 0700 In: 2059 [P.O.:597; I.V.:700; Blood:762] Out: 7253 [Urine:3595] Intake/Output this shift: Total I/O In: 762 [Blood:762] Out: 870 [Urine:870]  General appearance: alert, cooperative, no distress, moderately obese and pale Resp: clear to auscultation bilaterally Cardio: regular rate and rhythm, S1, S2 normal, no murmur, click, rub or gallop GI: soft, non-tender; bowel sounds normal; no masses,  no organomegaly  Lab Results:  Recent Labs  02/27/17 0346 02/27/17 1059 02/27/17 1635  WBC 10.1 9.5 8.0  HGB 9.6* 9.0* 7.2*  HCT 28.4* 27.3* 22.1*  PLT 214 205 180   BMET  Recent Labs  02/26/17 0241  NA 136  K 3.7  CL 106  CO2 24  GLUCOSE 143*  BUN 13  CREATININE 1.26*  CALCIUM 8.0*   LFT  Recent Labs  02/26/17 0241  PROT 5.0*  ALBUMIN 3.1*  AST 17  ALT 16*  ALKPHOS 40  BILITOT 0.9   PT/INR  Recent Labs  02/26/17 0241  LABPROT 14.4  INR 1.13   Studies/Results: Ct  Angio Abd/pel W/ And/or W/o  Result Date: 02/28/2017 CLINICAL DATA:  GI bleed EXAM: CTA ABDOMEN AND PELVIS wITHOUT AND WITH CONTRAST TECHNIQUE: Multidetector CT imaging of the abdomen and pelvis was performed using the standard protocol during bolus administration of intravenous contrast. Multiplanar reconstructed images and MIPs were obtained and reviewed to evaluate the vascular anatomy. CONTRAST:  100 mL Isovue 370 intravenous COMPARISON:  09/27/2007 FINDINGS: VASCULAR Aorta: The abdominal aorta is normal in caliber. There is extensive atherosclerotic calcification. No dissection, stenosis or occlusion. Celiac: The celiac axis is heavily stenotic at its origin, over 70% diameter reduction with mild poststenotic aneurysmal dilatation. No dissection or occlusion. SMA: The SMA is heavily calcified at its origin with about 60% diameter reduction stenosis. Remainder is patent. No dissection. Renals: Both renal arteries are heavily calcified at their origins, without significant stenosis. No dissection or occlusion. IMA: The IMA is patent and normal in caliber. Inflow: Mild-to-moderate atherosclerotic calcifications. Normal caliber. No dissection or occlusion. Proximal Outflow: Common femoral arteries and femoral bifurcations exhibit extensive atherosclerotic calcification but no aneurysm, dissection or occlusion. Proximal portions of the superficial and profunda femoral arteries are widely patent. Veins: No obvious venous abnormality within the limitations of this arterial phase study. Review of the MIP images confirms the above findings. NON-VASCULAR Lower chest: No acute abnormality. Hepatobiliary: No focal liver abnormality is seen. No gallstones, gallbladder wall thickening, or biliary dilatation. Pancreas: Unremarkable. No pancreatic ductal dilatation or surrounding inflammatory changes. Spleen: Normal in size without focal  abnormality. Adrenals/Urinary Tract: Adrenal glands are unremarkable. Kidneys are  normal, without renal calculi, focal lesion, or hydronephrosis. Bladder is unremarkable. Stomach/Bowel: Hiatal hernia. Stomach and small bowel are otherwise normal. Appendix is normal. Colon is unremarkable. No intrinsic bowel lesion. No inflammation or obstruction of bowel. No CT evidence of bowel ischemia. Lymphatic: No pathologic adenopathy in the abdomen or pelvis. Reproductive: Unremarkable Other: No focal inflammation.  No ascites. Musculoskeletal: No significant skeletal lesion. IMPRESSION: VASCULAR 1. Extensive atherosclerosis of the aorta and its major branches. No aneurysm or occlusion. No dissection. 2. 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 IMA is patent. NON-VASCULAR No acute findings in the abdomen or pelvis. Small hiatal hernia. No CT evidence of bowel ischemia, inflammation or obstruction. Electronically Signed   By: Andreas Newport M.D.   On: 02/28/2017 00:33   Medications: I have reviewed the patient's current medications.  Assessment/Plan: Severe anemia was secured to presume diverticular bleed. Continue supportive care for now. Watch for signs of ischemia in the future considering his findings on the CT angiogram done yesterday.  LOS: 2 days   Azuri Bozard 02/28/2017, 6:39 AM

## 2017-02-28 NOTE — Progress Notes (Signed)
PROGRESS NOTE    Adrian Ellis  ASN:[id number] DOB: 06/02/1940 DOA: 02/25/2017 PCP: Iona Beard, MD   Brief Narrative: 76 y.o. male with medical history significant for CAD, hypertension, hypothyroidism, prostate cancer and prostate enlargement, hypothyroidism, presenting with rectal bleeding. Patient with history of hematochezia secondary to his radiation proctitis. Evaluated by GI and admitted for further evaluation.  Assessment & Plan:  # Hematochezia likely secondary to diverticular bleed: -Sigmoidoscopy and upper GI endoscopy showed no source of bleeding likely proximal diverticular bleed as per GI. -Received total 4 units of red blood cell transfusion. No bowel movement today. Hemoglobin is stable.GI consult appreciated. Continue monitor CBC.start clear liquid diet. -Discontinue aspirin -unable to do bleeding scan yesterday therefore CT angio was done.CT angiogram was consistent withhigh-grade restenosis although is in a celiac axis and moderate stenosis of the origin of the SMA. I consulted vascular surgery and discussed with Dr. Donzetta Matters for the consult.  #Acute blood loss anemia/symptomatically anemia: -received 4 units of blood total. Monitor CBC.   # History of hypertension: Resume low-dose amlodipine. Monitor blood pressure.  #Hypothyroidism: Continue Synthroid.  #History of prostate cancer and BPH: Recommended outpatient follow-up. Continue Flomax.  #Chronic kidney disease stage 3: Serum creatinine level around baseline. Continue monitor.  DVT prophylaxis:SCD Code Status: full code  Family Communication: no family at bedside  Disposition Plan:Currently admitted    Consultants:   GI   Procedures: sigmoidoscopy and EGD  Antimicrobials:None   Subjective: Seen and examined at bedside. No bowel movement today. Denied headache, dizziness, nausea vomiting chest pain shortness of breath. Objective: Vitals:   02/28/17 0115 02/28/17 0138 02/28/17 0545 02/28/17 0930  BP:  (!) 123/46 (!) 128/48 (!) 163/62 (!) 144/53  Pulse: 74 73 63 69  Resp: 18 18 18 19   Temp: 98.1 F (36.7 C) 98.1 F (36.7 C) 98 F (36.7 C) 98.1 F (36.7 C)  TempSrc: Oral Oral Oral Oral  SpO2: 100% 100% 100% 100%  Weight:      Height:        Intake/Output Summary (Last 24 hours) at 02/28/17 1416 Last data filed at 02/28/17 1313  Gross per 24 hour  Intake              762 ml  Output             1990 ml  Net            -1228 ml   Filed Weights   02/25/17 0514 02/25/17 2127 02/27/17 2020  Weight: 96.2 kg (212 lb) 96.6 kg (213 lb) 96 kg (211 lb 10.3 oz)    Examination:  General exam: not in distress Respiratory system: clear bilaterally, respiratory effort normal no wheezing Cardiovascular system: regular rate rhythm S1-S2 normal. No pedal edema Gastrointestinal system: abdomen soft, nontender, nondistended. Bowel sound positive Central nervous system: Alert and oriented. No focal neurological deficits. Skin: No rashes, lesions or ulcers Psychiatry: Judgement and insight appear normal. Mood & affect appropriate.     Data Reviewed: I have personally reviewed following labs and imaging studies  CBC:  Recent Labs Lab 02/26/17 2057 02/27/17 0346 02/27/17 1059 02/27/17 1635 02/28/17 0958  WBC 9.3 10.1 9.5 8.0 11.9*  HGB 9.4* 9.6* 9.0* 7.2* 10.1*  HCT 28.7* 28.4* 27.3* 22.1* 29.3*  MCV 91.1 90.4 91.3 91.3 89.9  PLT 196 214 205 180 193   Basic Metabolic Panel:  Recent Labs Lab 02/25/17 0519 02/26/17 0241  NA 141 136  K 3.9 3.7  CL 104 106  CO2  25 24  GLUCOSE 118* 143*  BUN 15 13  CREATININE 1.22 1.26*  CALCIUM 9.6 8.0*   GFR: Estimated Creatinine Clearance: 59 mL/min (A) (by C-G formula based on SCr of 1.26 mg/dL (H)). Liver Function Tests:  Recent Labs Lab 02/25/17 0519 02/26/17 0241  AST 19 17  ALT 21 16*  ALKPHOS 57 40  BILITOT 0.6 0.9  PROT 6.7 5.0*  ALBUMIN 4.0 3.1*   No results for input(s): LIPASE, AMYLASE in the last 168 hours. No  results for input(s): AMMONIA in the last 168 hours. Coagulation Profile:  Recent Labs Lab 02/26/17 0241  INR 1.13   Cardiac Enzymes: No results for input(s): CKTOTAL, CKMB, CKMBINDEX, TROPONINI in the last 168 hours. BNP (last 3 results) No results for input(s): PROBNP in the last 8760 hours. HbA1C: No results for input(s): HGBA1C in the last 72 hours. CBG: No results for input(s): GLUCAP in the last 168 hours. Lipid Profile: No results for input(s): CHOL, HDL, LDLCALC, TRIG, CHOLHDL, LDLDIRECT in the last 72 hours. Thyroid Function Tests: No results for input(s): TSH, T4TOTAL, FREET4, T3FREE, THYROIDAB in the last 72 hours. Anemia Panel: No results for input(s): VITAMINB12, FOLATE, FERRITIN, TIBC, IRON, RETICCTPCT in the last 72 hours. Sepsis Labs: No results for input(s): PROCALCITON, LATICACIDVEN in the last 168 hours.  No results found for this or any previous visit (from the past 240 hour(s)).       Radiology Studies: Ct Angio Abd/pel W/ And/or W/o  Result Date: 02/28/2017 CLINICAL DATA:  GI bleed EXAM: CTA ABDOMEN AND PELVIS wITHOUT AND WITH CONTRAST TECHNIQUE: Multidetector CT imaging of the abdomen and pelvis was performed using the standard protocol during bolus administration of intravenous contrast. Multiplanar reconstructed images and MIPs were obtained and reviewed to evaluate the vascular anatomy. CONTRAST:  100 mL Isovue 370 intravenous COMPARISON:  09/27/2007 FINDINGS: VASCULAR Aorta: The abdominal aorta is normal in caliber. There is extensive atherosclerotic calcification. No dissection, stenosis or occlusion. Celiac: The celiac axis is heavily stenotic at its origin, over 70% diameter reduction with mild poststenotic aneurysmal dilatation. No dissection or occlusion. SMA: The SMA is heavily calcified at its origin with about 60% diameter reduction stenosis. Remainder is patent. No dissection. Renals: Both renal arteries are heavily calcified at their origins,  without significant stenosis. No dissection or occlusion. IMA: The IMA is patent and normal in caliber. Inflow: Mild-to-moderate atherosclerotic calcifications. Normal caliber. No dissection or occlusion. Proximal Outflow: Common femoral arteries and femoral bifurcations exhibit extensive atherosclerotic calcification but no aneurysm, dissection or occlusion. Proximal portions of the superficial and profunda femoral arteries are widely patent. Veins: No obvious venous abnormality within the limitations of this arterial phase study. Review of the MIP images confirms the above findings. NON-VASCULAR Lower chest: No acute abnormality. Hepatobiliary: No focal liver abnormality is seen. No gallstones, gallbladder wall thickening, or biliary dilatation. Pancreas: Unremarkable. No pancreatic ductal dilatation or surrounding inflammatory changes. Spleen: Normal in size without focal abnormality. Adrenals/Urinary Tract: Adrenal glands are unremarkable. Kidneys are normal, without renal calculi, focal lesion, or hydronephrosis. Bladder is unremarkable. Stomach/Bowel: Hiatal hernia. Stomach and small bowel are otherwise normal. Appendix is normal. Colon is unremarkable. No intrinsic bowel lesion. No inflammation or obstruction of bowel. No CT evidence of bowel ischemia. Lymphatic: No pathologic adenopathy in the abdomen or pelvis. Reproductive: Unremarkable Other: No focal inflammation.  No ascites. Musculoskeletal: No significant skeletal lesion. IMPRESSION: VASCULAR 1. Extensive atherosclerosis of the aorta and its major branches. No aneurysm or occlusion. No dissection. 2. High-grade  stenosis at the origin of the celiac axis. Moderate stenosis at the origin of the SMA. IMA is patent. NON-VASCULAR No acute findings in the abdomen or pelvis. Small hiatal hernia. No CT evidence of bowel ischemia, inflammation or obstruction. Electronically Signed   By: Andreas Newport M.D.   On: 02/28/2017 00:33        Scheduled  Meds: . amLODipine  5 mg Oral Daily  . feeding supplement  1 Container Oral TID BM  . finasteride  5 mg Oral Daily  . levothyroxine  112 mcg Oral QAC breakfast  . tamsulosin  0.4 mg Oral Daily   Continuous Infusions: . sodium chloride       LOS: 2 days    Pocahontas Cohenour Tanna Furry, MD [facility] Pager [phone]  If 7PM-7AM, please contact night-coverage [email or web address] Password [email or web address] 02/28/2017, 2:16 PM

## 2017-03-01 LAB — TYPE AND SCREEN
ABO/RH(D): B POS
ANTIBODY SCREEN: NEGATIVE
UNIT DIVISION: 0
UNIT DIVISION: 0
Unit division: 0
Unit division: 0

## 2017-03-01 LAB — BPAM RBC
BLOOD PRODUCT EXPIRATION DATE: 201810262359
Blood Product Expiration Date: 201810252359
Blood Product Expiration Date: 201810252359
Blood Product Expiration Date: 201810252359
ISSUE DATE / TIME: 201810111444
ISSUE DATE / TIME: 201810111711
ISSUE DATE / TIME: 201810121902
ISSUE DATE / TIME: 201810130111
UNIT TYPE AND RH: 7300
Unit Type and Rh: 7300
Unit Type and Rh: 7300
Unit Type and Rh: 7300

## 2017-03-01 LAB — LIPID PANEL
Cholesterol: 91 mg/dL (ref 0–200)
HDL: 23 mg/dL — ABNORMAL LOW (ref >40)
LDL CALC: 34 mg/dL (ref 0–99)
Total CHOL/HDL Ratio: 4 RATIO
Triglycerides: 168 mg/dL — ABNORMAL HIGH (ref <150)
VLDL: 34 mg/dL (ref 0–40)

## 2017-03-01 LAB — CBC
HEMATOCRIT: 26.9 % — AB (ref 39.0–52.0)
HEMOGLOBIN: 9.2 g/dL — AB (ref 13.0–17.0)
MCH: 30.8 pg (ref 26.0–34.0)
MCHC: 34.2 g/dL (ref 30.0–36.0)
MCV: 90 fL (ref 78.0–100.0)
Platelets: 170 10*3/uL (ref 150–400)
RBC: 2.99 MIL/uL — ABNORMAL LOW (ref 4.22–5.81)
RDW: 14.3 % (ref 11.5–15.5)
WBC: 10 10*3/uL (ref 4.0–10.5)

## 2017-03-01 NOTE — Progress Notes (Signed)
Subjective: Since I last evaluated the patient, he seems to be doing fairly well. He denies having any abdominal pain nausea vomiting is no history of melena or hematochezia. He is passed a lot of flatus but has not has not had a bowel movement today he has a good appetite and is eager to go home.  Objective: Vital signs in last 24 hours: Temp:  [98.2 F (36.8 C)-98.3 F (36.8 C)] 98.3 F (36.8 C) (10/14 0557) Pulse Rate:  [70-76] 76 (10/14 0750) Resp:  [18-19] 19 (10/14 0750) BP: (127-151)/(50-61) 127/61 (10/14 0750) SpO2:  [99 %-100 %] 100 % (10/14 0750) Weight:  [95.4 kg (210 lb 5.1 oz)] 95.4 kg (210 lb 5.1 oz) (10/13 2052) Last BM Date: 02/28/17  Intake/Output from previous day: 10/13 0701 - 10/14 0700 In: 480 [P.O.:480] Out: 150 [Urine:150] Intake/Output this shift: No intake/output data recorded.  General appearance: alert, cooperative, appears stated age, no distress and moderately obese Resp: clear to auscultation bilaterally Cardio: regular rate and rhythm, S1, S2 normal, no murmur, click, rub or gallop GI: soft, non-tender; bowel sounds normal; no masses,  no organomegaly  Lab Results:  Recent Labs  02/28/17 0958 02/28/17 1758 03/01/17 0800  WBC 11.9* 11.8* 10.0  HGB 10.1* 9.8* 9.2*  HCT 29.3* 29.1* 26.9*  PLT 161 171 170   BMET No results for input(s): NA, K, CL, CO2, GLUCOSE, BUN, CREATININE, CALCIUM in the last 72 hours. LFT No results for input(s): PROT, ALBUMIN, AST, ALT, ALKPHOS, BILITOT, BILIDIR, IBILI in the last 72 hours. PT/INR No results for input(s): LABPROT, INR in the last 72 hours. Hepatitis Panel No results for input(s): HEPBSAG, HCVAB, HEPAIGM, HEPBIGM in the last 72 hours. C-Diff No results for input(s): CDIFFTOX in the last 72 hours. No results for input(s): CDIFFPCR in the last 72 hours. Fecal Lactopherrin No results for input(s): FECLLACTOFRN in the last 72 hours.  Studies/Results: Ct Angio Abd/pel W/ And/or W/o  Result Date:  02/28/2017 CLINICAL DATA:  GI bleed EXAM: CTA ABDOMEN AND PELVIS wITHOUT AND WITH CONTRAST TECHNIQUE: Multidetector CT imaging of the abdomen and pelvis was performed using the standard protocol during bolus administration of intravenous contrast. Multiplanar reconstructed images and MIPs were obtained and reviewed to evaluate the vascular anatomy. CONTRAST:  100 mL Isovue 370 intravenous COMPARISON:  09/27/2007 FINDINGS: VASCULAR Aorta: The abdominal aorta is normal in caliber. There is extensive atherosclerotic calcification. No dissection, stenosis or occlusion. Celiac: The celiac axis is heavily stenotic at its origin, over 70% diameter reduction with mild poststenotic aneurysmal dilatation. No dissection or occlusion. SMA: The SMA is heavily calcified at its origin with about 60% diameter reduction stenosis. Remainder is patent. No dissection. Renals: Both renal arteries are heavily calcified at their origins, without significant stenosis. No dissection or occlusion. IMA: The IMA is patent and normal in caliber. Inflow: Mild-to-moderate atherosclerotic calcifications. Normal caliber. No dissection or occlusion. Proximal Outflow: Common femoral arteries and femoral bifurcations exhibit extensive atherosclerotic calcification but no aneurysm, dissection or occlusion. Proximal portions of the superficial and profunda femoral arteries are widely patent. Veins: No obvious venous abnormality within the limitations of this arterial phase study. Review of the MIP images confirms the above findings. NON-VASCULAR Lower chest: No acute abnormality. Hepatobiliary: No focal liver abnormality is seen. No gallstones, gallbladder wall thickening, or biliary dilatation. Pancreas: Unremarkable. No pancreatic ductal dilatation or surrounding inflammatory changes. Spleen: Normal in size without focal abnormality. Adrenals/Urinary Tract: Adrenal glands are unremarkable. Kidneys are normal, without renal calculi, focal lesion, or  hydronephrosis. Bladder is unremarkable. Stomach/Bowel: Hiatal hernia. Stomach and small bowel are otherwise normal. Appendix is normal. Colon is unremarkable. No intrinsic bowel lesion. No inflammation or obstruction of bowel. No CT evidence of bowel ischemia. Lymphatic: No pathologic adenopathy in the abdomen or pelvis. Reproductive: Unremarkable Other: No focal inflammation.  No ascites. Musculoskeletal: No significant skeletal lesion. IMPRESSION: VASCULAR 1. Extensive atherosclerosis of the aorta and its major branches. No aneurysm or occlusion. No dissection. 2. 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 IMA is patent. NON-VASCULAR No acute findings in the abdomen or pelvis. Small hiatal hernia. No CT evidence of bowel ischemia, inflammation or obstruction. Electronically Signed   By: Andreas Newport M.D.   On: 02/28/2017 00:33   Medications: I have reviewed the patient's current medications.  Assessment/Plan: 1) Rectal bleeding with anemia. Patient's rectal bleeding seems to stop but he is still anemic with a drop in hemoglobin from 9.8-9.2 g/dL today. 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 We will monitor him closely and evaluate him further if he develops signs of bowel ischemia. If his hemoglobin is stable tomorrow. Appreciate consult from vascular surgery I think it will place safe to send him home on Aspirin with close follow-up with Dr. Benson Norway on outpatient basis. Bernis Stecher 03/01/2017, 12:14 PM

## 2017-03-01 NOTE — Progress Notes (Signed)
  Progress Note    03/01/2017 12:11 PM 3 Days Post-Op  Subjective: no new issues, denies abdominal pain  Vitals:   03/01/17 0557 03/01/17 0750  BP: (!) 151/50 127/61  Pulse: 74 76  Resp: 18 19  Temp: 98.3 F (36.8 C)   SpO2: 99% 100%    Physical Exam: aaox3 Abdomen is soft, ntnd  CBC    Component Value Date/Time   WBC 10.0 03/01/2017 0800   RBC 2.99 (L) 03/01/2017 0800   HGB 9.2 (L) 03/01/2017 0800   HCT 26.9 (L) 03/01/2017 0800   PLT 170 03/01/2017 0800   MCV 90.0 03/01/2017 0800   MCH 30.8 03/01/2017 0800   MCHC 34.2 03/01/2017 0800   RDW 14.3 03/01/2017 0800   LYMPHSABS 1.5 04/23/2013 1010   MONOABS 0.4 04/23/2013 1010   EOSABS 0.2 04/23/2013 1010   BASOSABS 0.0 04/23/2013 1010    BMET    Component Value Date/Time   NA 136 02/26/2017 0241   K 3.7 02/26/2017 0241   CL 106 02/26/2017 0241   CO2 24 02/26/2017 0241   GLUCOSE 143 (H) 02/26/2017 0241   BUN 13 02/26/2017 0241   CREATININE 1.26 (H) 02/26/2017 0241   CALCIUM 8.0 (L) 02/26/2017 0241   GFRNONAA 53 (L) 02/26/2017 0241   GFRAA >60 02/26/2017 0241    INR    Component Value Date/Time   INR 1.13 02/26/2017 0241     Intake/Output Summary (Last 24 hours) at 03/01/17 1211 Last data filed at 03/01/17 0200  Gross per 24 hour  Intake              480 ml  Output               50 ml  Net              430 ml     Assessment:  76 y.o. male is here with hematochezia possible diverticular bleeding. 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   Plan: Resume aspirin when ok from bleeding standpoint F/u in office with mesenteric duplex   Jaria Conway C. Donzetta Matters, MD [facility] Office: [phone] Pager: [phone]  03/01/2017 12:11 PM

## 2017-03-01 NOTE — Progress Notes (Signed)
PROGRESS NOTE    Adrian Ellis  KGY:[id number] DOB: 02/14/1941 DOA: 02/25/2017 PCP: Iona Beard, MD   Brief Narrative: 76 y.o. male with medical history significant for CAD, hypertension, hypothyroidism, prostate cancer and prostate enlargement, hypothyroidism, presenting with rectal bleeding. Patient with history of hematochezia secondary to his radiation proctitis. Evaluated by GI and admitted for further evaluation.  Assessment & Plan:  # Hematochezia likely secondary to diverticular bleed: -Sigmoidoscopy and upper GI endoscopy showed no source of bleeding likely proximal diverticular bleed as per GI. -Received total 4 units of red blood cell transfusion. No bowel movement today.  -Hemoglobin is continuously dropping. Plan to repeat CBC tomorrow. Recommended to hold aspirin for a week by GI. Discussed with Dr.Mann from GI.  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 I consulted vascular surgery recommended outpatient follow-up. Plan to resume aspirin in a week. Cholesterol checked, LDL 34 therefore holding his statin.  #Acute blood loss anemia/symptomatically anemia: -received 4 units of blood total. Hemoglobin is trending down without active synovitis bleeding. Monitor CBC.   # History of hypertension: Continue low-dose amlodipine. Monitor blood pressure.  #Hypothyroidism: Continue Synthroid.  #History of prostate cancer and BPH: Recommended outpatient follow-up. Continue Flomax.  #Chronic kidney disease stage 3: Serum creatinine level around baseline. Continue monitor.  DVT prophylaxis:SCD Code Status: full code  Family Communication: no family at bedside  Disposition Plan:Currently admitted    Consultants:   GI   Procedures: sigmoidoscopy and EGD  Antimicrobials:None   Subjective: Seen and examined at bedside. No bowel movement today. Denied nausea vomiting chest shortness of  breath.  Objective: Vitals:   02/28/17 1842 02/28/17 2052 03/01/17 0557 03/01/17 0750  BP: (!) 139/59 (!) 137/59 (!) 151/50 127/61  Pulse: 70 71 74 76  Resp: 18 18 18 19   Temp: 98.2 F (36.8 C) 98.3 F (36.8 C) 98.3 F (36.8 C)   TempSrc: Oral Oral Oral   SpO2: 100% 100% 99% 100%  Weight:  95.4 kg (210 lb 5.1 oz)    Height:        Intake/Output Summary (Last 24 hours) at 03/01/17 1142 Last data filed at 03/01/17 0200  Gross per 24 hour  Intake              480 ml  Output               50 ml  Net              430 ml   Filed Weights   02/25/17 2127 02/27/17 2020 02/28/17 2052  Weight: 96.6 kg (213 lb) 96 kg (211 lb 10.3 oz) 95.4 kg (210 lb 5.1 oz)    Examination:  General exam: Not in distress Respiratory system: Clear bilaterally, no wheezing or crackle Cardiovascular system: Regular rate rhythm S1 and S2 normal. No pedal edema Gastrointestinal system: Abdomen soft, nontender, nondistended. Bowel sound positive. Central nervous system: Alert and oriented. No focal neurological deficits. Skin: No rashes, lesions or ulcers Psychiatry: Judgement and insight appear normal. Mood & affect appropriate.     Data Reviewed: I have personally reviewed following labs and imaging studies  CBC:  Recent Labs Lab 02/27/17 1059 02/27/17 1635 02/28/17 0958 02/28/17 1758 03/01/17 0800  WBC 9.5 8.0 11.9* 11.8* 10.0  HGB 9.0* 7.2* 10.1* 9.8* 9.2*  HCT 27.3* 22.1* 29.3* 29.1* 26.9*  MCV 91.3 91.3 89.9 89.5 90.0  PLT 205 180 161 171 170  Basic Metabolic Panel:  Recent Labs Lab 02/25/17 0519 02/26/17 0241  NA 141 136  K 3.9 3.7  CL 104 106  CO2 25 24  GLUCOSE 118* 143*  BUN 15 13  CREATININE 1.22 1.26*  CALCIUM 9.6 8.0*   GFR: Estimated Creatinine Clearance: 58.8 mL/min (A) (by C-G formula based on SCr of 1.26 mg/dL (H)). Liver Function Tests:  Recent Labs Lab 02/25/17 0519 02/26/17 0241  AST 19 17  ALT 21 16*  ALKPHOS 57 40  BILITOT 0.6 0.9  PROT 6.7  5.0*  ALBUMIN 4.0 3.1*   No results for input(s): LIPASE, AMYLASE in the last 168 hours. No results for input(s): AMMONIA in the last 168 hours. Coagulation Profile:  Recent Labs Lab 02/26/17 0241  INR 1.13   Cardiac Enzymes: No results for input(s): CKTOTAL, CKMB, CKMBINDEX, TROPONINI in the last 168 hours. BNP (last 3 results) No results for input(s): PROBNP in the last 8760 hours. HbA1C: No results for input(s): HGBA1C in the last 72 hours. CBG: No results for input(s): GLUCAP in the last 168 hours. Lipid Profile:  Recent Labs  03/01/17 0901  CHOL 91  HDL 23*  LDLCALC 34  TRIG 168*  CHOLHDL 4.0   Thyroid Function Tests: No results for input(s): TSH, T4TOTAL, FREET4, T3FREE, THYROIDAB in the last 72 hours. Anemia Panel: No results for input(s): VITAMINB12, FOLATE, FERRITIN, TIBC, IRON, RETICCTPCT in the last 72 hours. Sepsis Labs: No results for input(s): PROCALCITON, LATICACIDVEN in the last 168 hours.  No results found for this or any previous visit (from the past 240 hour(s)).       Radiology Studies: Ct Angio Abd/pel W/ And/or W/o  Result Date: 02/28/2017 CLINICAL DATA:  GI bleed EXAM: CTA ABDOMEN AND PELVIS wITHOUT AND WITH CONTRAST TECHNIQUE: Multidetector CT imaging of the abdomen and pelvis was performed using the standard protocol during bolus administration of intravenous contrast. Multiplanar reconstructed images and MIPs were obtained and reviewed to evaluate the vascular anatomy. CONTRAST:  100 mL Isovue 370 intravenous COMPARISON:  09/27/2007 FINDINGS: VASCULAR Aorta: The abdominal aorta is normal in caliber. There is extensive atherosclerotic calcification. No dissection, stenosis or occlusion. Celiac: The celiac axis is heavily stenotic at its origin, over 70% diameter reduction with mild poststenotic aneurysmal dilatation. No dissection or occlusion. SMA: The SMA is heavily calcified at its origin with about 60% diameter reduction stenosis. Remainder  is patent. No dissection. Renals: Both renal arteries are heavily calcified at their origins, without significant stenosis. No dissection or occlusion. IMA: The IMA is patent and normal in caliber. Inflow: Mild-to-moderate atherosclerotic calcifications. Normal caliber. No dissection or occlusion. Proximal Outflow: Common femoral arteries and femoral bifurcations exhibit extensive atherosclerotic calcification but no aneurysm, dissection or occlusion. Proximal portions of the superficial and profunda femoral arteries are widely patent. Veins: No obvious venous abnormality within the limitations of this arterial phase study. Review of the MIP images confirms the above findings. NON-VASCULAR Lower chest: No acute abnormality. Hepatobiliary: No focal liver abnormality is seen. No gallstones, gallbladder wall thickening, or biliary dilatation. Pancreas: Unremarkable. No pancreatic ductal dilatation or surrounding inflammatory changes. Spleen: Normal in size without focal abnormality. Adrenals/Urinary Tract: Adrenal glands are unremarkable. Kidneys are normal, without renal calculi, focal lesion, or hydronephrosis. Bladder is unremarkable. Stomach/Bowel: Hiatal hernia. Stomach and small bowel are otherwise normal. Appendix is normal. Colon is unremarkable. No intrinsic bowel lesion. No inflammation or obstruction of bowel. No CT evidence of bowel ischemia. Lymphatic: No pathologic adenopathy in the abdomen or pelvis. Reproductive:  Unremarkable Other: No focal inflammation.  No ascites. Musculoskeletal: No significant skeletal lesion. IMPRESSION: VASCULAR 1. Extensive atherosclerosis of the aorta and its major branches. No aneurysm or occlusion. No dissection. 2. 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 IMA is patent. NON-VASCULAR No acute findings in the abdomen or pelvis. Small hiatal hernia. No CT evidence of bowel ischemia, inflammation or obstruction.  Electronically Signed   By: Andreas Newport M.D.   On: 02/28/2017 00:33        Scheduled Meds: . amLODipine  5 mg Oral Daily  . feeding supplement  1 Container Oral TID BM  . finasteride  5 mg Oral Daily  . levothyroxine  112 mcg Oral QAC breakfast  . tamsulosin  0.4 mg Oral Daily   Continuous Infusions: . sodium chloride       LOS: 3 days    Dron Tanna Furry, MD [facility] Pager [phone]  If 7PM-7AM, please contact night-coverage [email or web address] Password [email or web address] 03/01/2017, 11:42 AM

## 2017-03-02 LAB — CBC
HEMATOCRIT: 25.8 % — AB (ref 39.0–52.0)
HEMATOCRIT: 28.4 % — AB (ref 39.0–52.0)
HEMOGLOBIN: 9.5 g/dL — AB (ref 13.0–17.0)
Hemoglobin: 8.7 g/dL — ABNORMAL LOW (ref 13.0–17.0)
MCH: 30.7 pg (ref 26.0–34.0)
MCH: 30.5 pg (ref 26.0–34.0)
MCHC: 33.7 g/dL (ref 30.0–36.0)
MCHC: 33.5 g/dL (ref 30.0–36.0)
MCV: 91.2 fL (ref 78.0–100.0)
MCV: 91.3 fL (ref 78.0–100.0)
PLATELETS: 185 10*3/uL (ref 150–400)
Platelets: 208 10*3/uL (ref 150–400)
RBC: 2.83 MIL/uL — AB (ref 4.22–5.81)
RBC: 3.11 MIL/uL — AB (ref 4.22–5.81)
RDW: 14.2 % (ref 11.5–15.5)
RDW: 14.3 % (ref 11.5–15.5)
WBC: 8 10*3/uL (ref 4.0–10.5)
WBC: 9.5 10*3/uL (ref 4.0–10.5)

## 2017-03-02 NOTE — Progress Notes (Signed)
Subjective: No further hematochezia.  Objective: Vital signs in last 24 hours: Temp:  [97.9 F (36.6 C)-98.2 F (36.8 C)] 98.2 F (36.8 C) (10/15 0514) Pulse Rate:  [76-85] 78 (10/15 0514) Resp:  [18-19] 18 (10/15 0514) BP: (127-144)/(53-63) 144/63 (10/15 0514) SpO2:  [99 %-100 %] 99 % (10/15 0514) Weight:  [95.4 kg (210 lb 5.1 oz)] 95.4 kg (210 lb 5.1 oz) (10/15 0500) Last BM Date: 03/01/17  Intake/Output from previous day: No intake/output data recorded. Intake/Output this shift: No intake/output data recorded.  General appearance: alert and no distress GI: soft, non-tender; bowel sounds normal; no masses,  no organomegaly  Lab Results:  Recent Labs  02/28/17 1758 03/01/17 0800 03/02/17 0322  WBC 11.8* 10.0 8.0  HGB 9.8* 9.2* 8.7*  HCT 29.1* 26.9* 25.8*  PLT 171 170 185   BMET No results for input(s): NA, K, CL, CO2, GLUCOSE, BUN, CREATININE, CALCIUM in the last 72 hours. LFT No results for input(s): PROT, ALBUMIN, AST, ALT, ALKPHOS, BILITOT, BILIDIR, IBILI in the last 72 hours. PT/INR No results for input(s): LABPROT, INR in the last 72 hours. Hepatitis Panel No results for input(s): HEPBSAG, HCVAB, HEPAIGM, HEPBIGM in the last 72 hours. C-Diff No results for input(s): CDIFFTOX in the last 72 hours. Fecal Lactopherrin No results for input(s): FECLLACTOFRN in the last 72 hours.  Studies/Results: No results found.  Medications:  Scheduled: . amLODipine  5 mg Oral Daily  . feeding supplement  1 Container Oral TID BM  . finasteride  5 mg Oral Daily  . levothyroxine  112 mcg Oral QAC breakfast  . tamsulosin  0.4 mg Oral Daily   Continuous: . sodium chloride Stopped (03/01/17 1507)    Assessment/Plan: 1) Hematochezia - Presumed right sided diverticula.  ? AVM bleed or a distal small bowel source such as a Meckle's. 2) Anemia. 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   The patient remains stable, but his HGB has dropped.  It is a mild drop without  any overt evidence of bleeding.  I do not know if he is equilibrating or if this is solely a fluctuation with his blood work, however, with the severity of his bleeding I cannot recommend discharge today.    Plan: 1) Follow HGB. 2) Transfuse as necessary. 3) If there is a continual downtrend in his HGB a bleeding scan will be ordered.  LOS: 4 days   Ines Rebel D 03/02/2017, 7:11 AM

## 2017-03-02 NOTE — Progress Notes (Signed)
PROGRESS NOTE    Adrian Ellis  IRW:[id number] DOB: 07/26/1940 DOA: 02/25/2017 PCP: Iona Beard, MD   Brief Narrative: 76 y.o. male with medical history significant for CAD, hypertension, hypothyroidism, prostate cancer and prostate enlargement, hypothyroidism, presenting with rectal bleeding. Patient with history of hematochezia secondary to his radiation proctitis. Evaluated by GI and admitted for further evaluation.  Assessment & Plan:  # Hematochezia likely secondary to diverticular bleed: -Sigmoidoscopy and upper GI endoscopy showed no source of bleeding likely proximal diverticular bleed as per GI. -Received total 4 units of red blood cell transfusion. No bowel movement today.  -Hemoglobin is trending down gradually. Patient has mild streaks of blood when wiping this morning.No overt bleeding noticed.plan is to repeat CBC, the fluctuation in hemoglobin could be due to body equilibrium. GI recommended to hold off discharge and monitor CBC. Discussed with the patient. If patient has overt bleeding consider bleeding scan.  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 I consulted vascular surgery recommended outpatient follow-up. Plan to resume aspirin in a week. Cholesterol checked, LDL 34 therefore holding his statin.  #Acute blood loss anemia/symptomatically anemia: -received 4 units of blood total. Hemoglobin is trending down without active synovitis bleeding. Monitor CBC.   # History of hypertension: Continue low-dose amlodipine. Monitor blood pressure.  #Hypothyroidism: Continue Synthroid.  #History of prostate cancer and BPH: Recommended outpatient follow-up. Continue Flomax.  #Chronic kidney disease stage 3: Serum creatinine level around baseline. Continue monitor.  DVT prophylaxis:SCD Code Status: full code  Family Communication: no family at bedside  Disposition Plan:Currently  admitted    Consultants:   GI   Procedures: sigmoidoscopy and EGD  Antimicrobials:None   Subjective: Seen and examined at bedside. Had a bowel movement today with small streak of blood while wiping. No nausea vomiting or abdominal pain. Denied dizziness, lghtheadedness.  Objective: Vitals:   03/01/17 2024 03/02/17 0500 03/02/17 0514 03/02/17 0934  BP: (!) 138/53  (!) 144/63 (!) 144/52  Pulse: 85  78 74  Resp: 19  18 18   Temp: 97.9 F (36.6 C)  98.2 F (36.8 C) 98.3 F (36.8 C)  TempSrc: Oral  Oral Oral  SpO2: 100%  99% 96%  Weight:  95.4 kg (210 lb 5.1 oz)    Height:        Intake/Output Summary (Last 24 hours) at 03/02/17 1124 Last data filed at 03/02/17 0200  Gross per 24 hour  Intake                0 ml  Output                0 ml  Net                0 ml   Filed Weights   02/27/17 2020 02/28/17 2052 03/02/17 0500  Weight: 96 kg (211 lb 10.3 oz) 95.4 kg (210 lb 5.1 oz) 95.4 kg (210 lb 5.1 oz)    Examination:  General exam: sitting on chair comfortably, not in distress Respiratory system: clear bilateral, no wheezing or crackle Cardiovascular system: regular rate rhythm, S1-S2 normal. No pedal edema. Gastrointestinal system: abdomen soft, nontender, nondistended. Bowel sound positive Central nervous system: Alert and oriented. No focal neurological deficits. Skin: No rashes, lesions or ulcers Psychiatry: Judgement and insight appear normal. Mood & affect appropriate.     Data Reviewed: I have personally reviewed following labs and imaging studies  CBC:  Recent Labs Lab 02/28/17 0958 02/28/17 1758 03/01/17 0800 03/02/17 0322 03/02/17 0914  WBC 11.9* 11.8* 10.0 8.0 9.5  HGB 10.1* 9.8* 9.2* 8.7* 9.5*  HCT 29.3* 29.1* 26.9* 25.8* 28.4*  MCV 89.9 89.5 90.0 91.2 91.3  PLT 161 171 170 185 450   Basic Metabolic Panel:  Recent Labs Lab 02/25/17 0519 02/26/17 0241  NA 141 136  K 3.9 3.7  CL 104 106  CO2 25 24  GLUCOSE 118* 143*  BUN 15 13   CREATININE 1.22 1.26*  CALCIUM 9.6 8.0*   GFR: Estimated Creatinine Clearance: 58.8 mL/min (A) (by C-G formula based on SCr of 1.26 mg/dL (H)). Liver Function Tests:  Recent Labs Lab 02/25/17 0519 02/26/17 0241  AST 19 17  ALT 21 16*  ALKPHOS 57 40  BILITOT 0.6 0.9  PROT 6.7 5.0*  ALBUMIN 4.0 3.1*   No results for input(s): LIPASE, AMYLASE in the last 168 hours. No results for input(s): AMMONIA in the last 168 hours. Coagulation Profile:  Recent Labs Lab 02/26/17 0241  INR 1.13   Cardiac Enzymes: No results for input(s): CKTOTAL, CKMB, CKMBINDEX, TROPONINI in the last 168 hours. BNP (last 3 results) No results for input(s): PROBNP in the last 8760 hours. HbA1C: No results for input(s): HGBA1C in the last 72 hours. CBG: No results for input(s): GLUCAP in the last 168 hours. Lipid Profile:  Recent Labs  03/01/17 0901  CHOL 91  HDL 23*  LDLCALC 34  TRIG 168*  CHOLHDL 4.0   Thyroid Function Tests: No results for input(s): TSH, T4TOTAL, FREET4, T3FREE, THYROIDAB in the last 72 hours. Anemia Panel: No results for input(s): VITAMINB12, FOLATE, FERRITIN, TIBC, IRON, RETICCTPCT in the last 72 hours. Sepsis Labs: No results for input(s): PROCALCITON, LATICACIDVEN in the last 168 hours.  No results found for this or any previous visit (from the past 240 hour(s)).       Radiology Studies: No results found.      Scheduled Meds: . amLODipine  5 mg Oral Daily  . feeding supplement  1 Container Oral TID BM  . finasteride  5 mg Oral Daily  . levothyroxine  112 mcg Oral QAC breakfast  . tamsulosin  0.4 mg Oral Daily   Continuous Infusions: . sodium chloride Stopped (03/01/17 1507)     LOS: 4 days    Olene Godfrey Tanna Furry, MD [facility] Pager [phone]  If 7PM-7AM, please contact night-coverage [email or web address] Password [email or web address] 03/02/2017, 11:24 AM

## 2017-03-03 ENCOUNTER — Telehealth: Payer: Self-pay | Admitting: Vascular Surgery

## 2017-03-03 LAB — CBC
HEMATOCRIT: 25.1 % — AB (ref 39.0–52.0)
HEMOGLOBIN: 8.4 g/dL — AB (ref 13.0–17.0)
MCH: 30.8 pg (ref 26.0–34.0)
MCHC: 33.5 g/dL (ref 30.0–36.0)
MCV: 91.9 fL (ref 78.0–100.0)
Platelets: 203 10*3/uL (ref 150–400)
RBC: 2.73 MIL/uL — AB (ref 4.22–5.81)
RDW: 14.6 % (ref 11.5–15.5)
WBC: 7.9 10*3/uL (ref 4.0–10.5)

## 2017-03-03 LAB — HEMOGLOBIN AND HEMATOCRIT, BLOOD
HEMATOCRIT: 25.1 % — AB (ref 39.0–52.0)
HEMOGLOBIN: 8.4 g/dL — AB (ref 13.0–17.0)

## 2017-03-03 NOTE — Progress Notes (Signed)
Subjective: No complaints.  No further bleeding.  Objective: Vital signs in last 24 hours: Temp:  [97.5 F (36.4 C)-98.4 F (36.9 C)] 97.5 F (36.4 C) (10/16 0839) Pulse Rate:  [73-80] 75 (10/16 0839) Resp:  [18] 18 (10/16 0839) BP: (137-166)/(55-61) 137/61 (10/16 0839) SpO2:  [96 %-100 %] 100 % (10/16 0839) Weight:  [95.4 kg (210 lb 5.1 oz)] 95.4 kg (210 lb 5.1 oz) (10/16 0500) Last BM Date: 03/02/17  Intake/Output from previous day: 10/15 0701 - 10/16 0700 In: 1200 [P.O.:1200] Out: 350 [Urine:350] Intake/Output this shift: Total I/O In: 717 [P.O.:717] Out: 0   General appearance: alert and no distress GI: soft, non-tender; bowel sounds normal; no masses,  no organomegaly  Lab Results:  Recent Labs  03/02/17 0322 03/02/17 0914 03/03/17 0322 03/03/17 0822  WBC 8.0 9.5 7.9  --   HGB 8.7* 9.5* 8.4* 8.4*  HCT 25.8* 28.4* 25.1* 25.1*  PLT 185 208 203  --    BMET No results for input(s): NA, K, CL, CO2, GLUCOSE, BUN, CREATININE, CALCIUM in the last 72 hours. LFT No results for input(s): PROT, ALBUMIN, AST, ALT, ALKPHOS, BILITOT, BILIDIR, IBILI in the last 72 hours. PT/INR No results for input(s): LABPROT, INR in the last 72 hours. Hepatitis Panel No results for input(s): HEPBSAG, HCVAB, HEPAIGM, HEPBIGM in the last 72 hours. C-Diff No results for input(s): CDIFFTOX in the last 72 hours. Fecal Lactopherrin No results for input(s): FECLLACTOFRN in the last 72 hours.  Studies/Results: No results found.  Medications:  Scheduled: . amLODipine  5 mg Oral Daily  . feeding supplement  1 Container Oral TID BM  . finasteride  5 mg Oral Daily  . levothyroxine  112 mcg Oral QAC breakfast  . tamsulosin  0.4 mg Oral Daily   Continuous: . sodium chloride Stopped (03/01/17 1507)    Assessment/Plan: 1) Presumed right sided diverticular bleed. 2) Anemia.   No further bleeding.  HGB has stabilized at 8.4 g/dL and he is asymptomatic.  He can be discharged home from the  GI perspective.  I will arrange for follow up in the office in 2 weeks.  I did tell the patient that a rebleed is possible.  LOS: 5 days   Gwynneth Fabio D 03/03/2017, 2:22 PM

## 2017-03-03 NOTE — Progress Notes (Signed)
Adrian Ellis to be D/C'd Home per MD order.  Discussed prescriptions and follow up appointments with the patient. Prescriptions given to patient, medication list explained in detail. Pt verbalized understanding.  Allergies as of 03/03/2017   No Known Allergies     Medication List    STOP taking these medications   amLODipine 10 MG tablet Commonly known as:  NORVASC   esomeprazole 40 MG packet Commonly known as:  NEXIUM     TAKE these medications   amlodipine-olmesartan 10-20 MG tablet Commonly known as:  AZOR Take 1 tablet by mouth daily.   aspirin 81 MG chewable tablet Chew 81 mg by mouth daily.   finasteride 5 MG tablet Commonly known as:  PROSCAR Take 5 mg by mouth daily.   levothyroxine 112 MCG tablet Commonly known as:  SYNTHROID, LEVOTHROID Take 112 mcg by mouth daily before breakfast.   multivitamin with minerals Tabs tablet Take 1 tablet by mouth daily.   tamsulosin 0.4 MG Caps capsule Commonly known as:  FLOMAX Take 0.4 mg by mouth daily.       Vitals:   03/03/17 0531 03/03/17 0839  BP: (!) 166/55 137/61  Pulse: 73 75  Resp: 18 18  Temp: 98.3 F (36.8 C) (!) 97.5 F (36.4 C)  SpO2: 97% 100%    Skin clean, dry and intact without evidence of skin break down, no evidence of skin tears noted. IV catheter discontinued intact. Site without signs and symptoms of complications. Dressing and pressure applied. Pt denies pain at this time. No complaints noted.  An After Visit Summary was printed and given to the patient. Patient escorted via [place], and D/C home via private auto.  Emilio Math, RN [facility] Phone [phone]

## 2017-03-03 NOTE — Telephone Encounter (Signed)
-----   Message from Mena Goes, RN sent at 03/01/2017  6:47 PM EDT ----- Regarding: 6 months w/ duplex and NP   ----- Message ----- From: Waynetta Sandy, MD Sent: 03/01/2017  12:12 PM To: [facility] [address]  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

## 2017-03-03 NOTE — Telephone Encounter (Signed)
Sched appt 09/11/16; lab at 8:00 and NP at 8:45. Mailed appt letter.

## 2017-03-03 NOTE — Discharge Summary (Signed)
Physician Discharge Summary  Adrian Ellis DQQ:[id number] DOB: 06/19/2010 DOA: 02/25/2017  PCP: Iona Beard, MD  Admit date: 02/25/2017 Discharge date: 03/03/2017  Admitted From:home Disposition:  home  Recommendations for Outpatient Follow-up:  1. Follow up with PCP/GI in 1-2 weeks 2. Please obtain BMP/CBC in one week   Home Health:none Equipment/Devices:none Discharge Condition:stable CODE STATUS:full code Diet recommendation:heart healthy  Brief/Interim Summary: 76 y.o.malewith medical history significant for CAD, hypertension, hypothyroidism, prostate cancer and prostate enlargement, hypothyroidism, presenting with rectal bleeding. Patient with history of hematochezia secondary to his radiation proctitis. Evaluated by GI and admitted for further evaluation.  # Hematochezia likely secondary to diverticular bleed: -Sigmoidoscopy and upper GI endoscopy showed no source of bleeding, likely proximal diverticular bleed as per GI. -Received total 4 units of red blood cell transfusion. -no further bleeding since yesterday as per patient. Hemoglobin is stable. I discussed with Dr.Hung from GI who recommended to resume aspirin and outpatient follow-up. Patient with no nausea vomiting or abdominal pain. He is medically stable and bleeding has stopped.  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 I consulted vascular surgery recommended outpatient follow-up. On aspirin. Cholesterol checked, LDL 34 therefore holding his statin.  #Acute blood loss anemia/symptomatically anemia: -received 4 units of blood total. Hemoglobin is stable and the bleeding has stopped.   # History of hypertension: Continue home medication. Monitor blood pressure  #Hypothyroidism: Continue Synthroid.  #History of prostate cancer and BPH: Recommended outpatient follow-up. Continue Flomax.  #Chronic kidney disease stage 3:  Serum creatinine level around baseline. Continue monitor.    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[facility])    Discharge Instructions  Discharge Instructions    Call MD for:  difficulty breathing, headache or visual disturbances    Complete by:  As directed    Call MD for:  extreme fatigue    Complete by:  As directed    Call MD for:  hives    Complete by:  As directed    Call MD for:  persistant dizziness or light-headedness    Complete by:  As directed    Call MD for:  persistant nausea and vomiting    Complete by:  As directed    Call MD for:  severe uncontrolled pain    Complete by:  As directed    Call MD for:  temperature >100.4    Complete by:  As directed    Diet - low sodium heart healthy    Complete by:  As directed    Discharge instructions    Complete by:  As directed    Please contact Dr. Benson Norway or come to ER if you have any GI bleeding.   Increase activity slowly    Complete by:  As directed      Allergies as of 03/03/2017   No Known Allergies     Medication List    STOP taking these medications   amLODipine 10 MG tablet Commonly known as:  NORVASC   esomeprazole 40 MG packet Commonly known as:  Tilden these medications   amlodipine-olmesartan 10-20 MG tablet Commonly known as:  AZOR  Take 1 tablet by mouth daily.   aspirin 81 MG chewable tablet Chew 81 mg by mouth daily.   finasteride 5 MG tablet Commonly known as:  PROSCAR Take 5 mg by mouth daily.   levothyroxine 112 MCG tablet Commonly known as:  SYNTHROID, LEVOTHROID Take 112 mcg by mouth daily before breakfast.   multivitamin with minerals Tabs tablet Take 1 tablet by mouth daily.   tamsulosin 0.4 MG Caps  capsule Commonly known as:  FLOMAX Take 0.4 mg by mouth daily.      Follow-up Information    Iona Beard, MD. Schedule an appointment as soon as possible for a visit in 1 week(s).   Specialty:  Family Medicine Contact information: [address] [place] [address] [phone]        Carol Ada, MD. Schedule an appointment as soon as possible for a visit in 2 week(s).   Specialty:  Gastroenterology Contact information: [place], [place] [address] [phone]        Waynetta Sandy, MD. Schedule an appointment as soon as possible for a visit in 1 month(s).   Specialties:  Vascular Surgery, Cardiology Contact information: [address] [place] [address] [phone]          No Known Allergies  Consultations: GI  Procedures/Studies: endoscopy  Subjective: Seen and examined at bedside. Denied headache, dizziness, nausea, vomiting, chest pain, shortness of breath, abdominal pain or hematochezia.  Discharge Exam: Vitals:   03/03/17 0531 03/03/17 0839  BP: (!) 166/55 137/61  Pulse: 73 75  Resp: 18 18  Temp: 98.3 F (36.8 C) (!) 97.5 F (36.4 C)  SpO2: 97% 100%   Vitals:   03/02/17 2117 03/03/17 0500 03/03/17 0531 03/03/17 0839  BP: (!) 149/59  (!) 166/55 137/61  Pulse: 80  73 75  Resp: 18  18 18   Temp: 98.3 F (36.8 C)  98.3 F (36.8 C) (!) 97.5 F (36.4 C)  TempSrc: Oral  Oral Oral  SpO2: 100%  97% 100%  Weight: 95.4 kg (210 lb 5.1 oz) 95.4 kg (210 lb 5.1 oz)    Height:        General: Pt is alert, awake, not in acute distress Cardiovascular: RRR, S1/S2 +, no rubs, no gallops Respiratory: CTA bilaterally, no wheezing, no rhonchi Abdominal: Soft, NT, ND, bowel sounds + Extremities: no edema, no cyanosis    The results of significant diagnostics from this hospitalization (including imaging, microbiology, ancillary and laboratory) are listed below for reference.     Microbiology: No results  found for this or any previous visit (from the past 240 hour(s)).   Labs: BNP (last 3 results) No results for input(s): BNP in the last 8760 hours. Basic Metabolic Panel:  Recent Labs Lab 02/25/17 0519 02/26/17 0241  NA 141 136  K 3.9 3.7  CL 104 106  CO2 25 24  GLUCOSE 118* 143*  BUN 15 13  CREATININE 1.22 1.26*  CALCIUM 9.6 8.0*   Liver Function Tests:  Recent Labs Lab 02/25/17 0519 02/26/17 0241  AST 19 17  ALT 21 16*  ALKPHOS 57 40  BILITOT 0.6 0.9  PROT 6.7 5.0*  ALBUMIN 4.0 3.1*   No results for input(s): LIPASE, AMYLASE in the last 168 hours. No results for input(s): AMMONIA in the last 168 hours. CBC:  Recent Labs Lab 02/28/17 1758 03/01/17 0800 03/02/17 0322 03/02/17 0914 03/03/17 0322 03/03/17 0822  WBC 11.8* 10.0 8.0 9.5 7.9  --   HGB  9.8* 9.2* 8.7* 9.5* 8.4* 8.4*  HCT 29.1* 26.9* 25.8* 28.4* 25.1* 25.1*  MCV 89.5 90.0 91.2 91.3 91.9  --   PLT 171 170 185 208 203  --    Cardiac Enzymes: No results for input(s): CKTOTAL, CKMB, CKMBINDEX, TROPONINI in the last 168 hours. BNP: Invalid input(s): POCBNP CBG: No results for input(s): GLUCAP in the last 168 hours. D-Dimer No results for input(s): DDIMER in the last 72 hours. Hgb A1c No results for input(s): HGBA1C in the last 72 hours. Lipid Profile  Recent Labs  03/01/17 0901  CHOL 91  HDL 23*  LDLCALC 34  TRIG 168*  CHOLHDL 4.0   Thyroid function studies No results for input(s): TSH, T4TOTAL, T3FREE, THYROIDAB in the last 72 hours.  Invalid input(s): FREET3 Anemia work up No results for input(s): VITAMINB12, FOLATE, FERRITIN, TIBC, IRON, RETICCTPCT in the last 72 hours. Urinalysis    Component Value Date/Time   COLORURINE YELLOW 02/25/2017 1400   APPEARANCEUR CLEAR 02/25/2017 1400   LABSPEC 1.013 02/25/2017 1400   PHURINE 6.0 02/25/2017 1400   GLUCOSEU NEGATIVE 02/25/2017 1400   HGBUR MODERATE (A) 02/25/2017 1400   BILIRUBINUR NEGATIVE 02/25/2017 1400   KETONESUR NEGATIVE  02/25/2017 1400   PROTEINUR NEGATIVE 02/25/2017 1400   NITRITE NEGATIVE 02/25/2017 1400   LEUKOCYTESUR NEGATIVE 02/25/2017 1400   Sepsis Labs Invalid input(s): PROCALCITONIN,  WBC,  LACTICIDVEN Microbiology No results found for this or any previous visit (from the past 240 hour(s)).   Time coordinating discharge: 28 minutes  SIGNED:   Rosita Fire, MD  [facility] 03/03/2017, 11:07 AM  If 7PM-7AM, please contact night-coverage [email or web address] Password [email or web address]

## 2017-03-03 NOTE — Care Management Important Message (Signed)
Important Message  Patient Details  Name: Adrian Ellis MRN: [id number] Date of Birth: Aug 24, 1940   Medicare Important Message Given:  Yes    [name] Abena 03/03/2017, 9:52 AM

## 2017-03-09 DIAGNOSIS — N400 Enlarged prostate without lower urinary tract symptoms: Secondary | ICD-10-CM | POA: Diagnosis not present

## 2017-03-09 DIAGNOSIS — C61 Malignant neoplasm of prostate: Secondary | ICD-10-CM | POA: Diagnosis not present

## 2017-03-16 DIAGNOSIS — K625 Hemorrhage of anus and rectum: Secondary | ICD-10-CM | POA: Diagnosis not present

## 2017-03-17 DIAGNOSIS — I10 Essential (primary) hypertension: Secondary | ICD-10-CM | POA: Diagnosis not present

## 2017-03-17 DIAGNOSIS — D649 Anemia, unspecified: Secondary | ICD-10-CM | POA: Diagnosis not present

## 2017-03-17 DIAGNOSIS — B353 Tinea pedis: Secondary | ICD-10-CM | POA: Diagnosis not present

## 2017-03-23 ENCOUNTER — Other Ambulatory Visit: Payer: Self-pay

## 2017-03-23 DIAGNOSIS — K551 Chronic vascular disorders of intestine: Secondary | ICD-10-CM

## 2017-03-30 DIAGNOSIS — B353 Tinea pedis: Secondary | ICD-10-CM | POA: Diagnosis not present

## 2017-06-01 DIAGNOSIS — B353 Tinea pedis: Secondary | ICD-10-CM | POA: Diagnosis not present

## 2017-07-06 DIAGNOSIS — B352 Tinea manuum: Secondary | ICD-10-CM | POA: Diagnosis not present

## 2017-07-06 DIAGNOSIS — H6122 Impacted cerumen, left ear: Secondary | ICD-10-CM | POA: Diagnosis not present

## 2017-07-20 DIAGNOSIS — B353 Tinea pedis: Secondary | ICD-10-CM | POA: Diagnosis not present

## 2017-08-24 DIAGNOSIS — E039 Hypothyroidism, unspecified: Secondary | ICD-10-CM | POA: Diagnosis not present

## 2017-09-07 DIAGNOSIS — C61 Malignant neoplasm of prostate: Secondary | ICD-10-CM | POA: Diagnosis not present

## 2017-09-07 DIAGNOSIS — R3129 Other microscopic hematuria: Secondary | ICD-10-CM | POA: Diagnosis not present

## 2017-09-07 DIAGNOSIS — N401 Enlarged prostate with lower urinary tract symptoms: Secondary | ICD-10-CM | POA: Diagnosis not present

## 2017-09-11 ENCOUNTER — Inpatient Hospital Stay (HOSPITAL_COMMUNITY): Admit: 2017-09-11 | Payer: Medicare Other

## 2017-09-11 ENCOUNTER — Ambulatory Visit: Payer: Medicare Other | Admitting: Family

## 2017-11-17 ENCOUNTER — Other Ambulatory Visit: Payer: Self-pay

## 2017-11-17 ENCOUNTER — Encounter: Payer: Self-pay | Admitting: Family

## 2017-11-17 ENCOUNTER — Ambulatory Visit (HOSPITAL_COMMUNITY)
Admission: RE | Admit: 2017-11-17 | Discharge: 2017-11-17 | Disposition: A | Discharge status: Home / Self Care | Payer: Medicare Other | Source: Ambulatory Visit | Attending: Vascular Surgery | Admitting: Vascular Surgery

## 2017-11-17 ENCOUNTER — Ambulatory Visit: Payer: Medicare Other | Admitting: Family

## 2017-11-17 VITALS — BP 137/70 | HR 69 | Temp 98.1°F | Resp 18 | Ht 72.0 in | Wt 216.0 lb

## 2017-11-17 DIAGNOSIS — K551 Chronic vascular disorders of intestine: Secondary | ICD-10-CM | POA: Diagnosis not present

## 2017-11-17 DIAGNOSIS — R109 Unspecified abdominal pain: Secondary | ICD-10-CM | POA: Insufficient documentation

## 2017-11-17 NOTE — Progress Notes (Signed)
Established Mesenteric Ischemia  History of Present Illness  Adrian Ellis is a 77 y.o. (06/21/1940) male who was admitted with GI bleed, was likely diverticular in nature, to [facility] in October 2018, Dr. Donzetta Matters consulted on pt at that time. 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 From a mesenteric ischemia standpoint he does have a weight loss of 6 pounds recently that he cannot attribute to anything. He does not have any issue with eating specifically no food fear and no postprandial pain. He is a former smoker having quit about 2003 and also has hypertension as risk factors for his vascular disease. He does take aspirin although this is currently held for his GI bleeding.  At that consult date: high grade stenosis of celiac and moderate at level of sma and likely some cyanosis of his IMA although all 3 of these vessels were patent in the SMA specifically appears to have adequate flow. Given that he was admitted for GI bleeding and did not exhibit symptoms of mesenteric ischemia at that time, will have him follow up with a duplex as an outpatient to evaluate the hemodynamic consequences of his visceral vessel stenosis. He should be on aspirin when able from a GI bleeding standpoint and also statin should he be able to tolerate.  He denies post prandial abdominal pain. He states his weight has remained stable.   He denies any hx of stroke or TIA, denies any known cardiac problems. He does not seem to have claudication sx's with walking. His feet feel numb at times when supine; he denies any known back problems.   Diabetic: no Tobacco use: quit in 2003, smoked x 30 years, states "I did not inhale"  He does not take a statin, he does take a daily 81 mg ASA.   Past Medical History:  Diagnosis Date  . Arthritis    "right shoulder" (02/25/2017)  . CKD (chronic kidney disease), stage II   . GERD  (gastroesophageal reflux disease)   . Hypertension   . Hypothyroidism   . Lower urinary tract symptoms (LUTS)   . Microhematuria    history of, work-up in 2009 negative  . Prostate cancer ([facility]) 02/23/14   gleason 3+4=7, volume 25.98 cc  . Prostate enlargement   . Sinus problem   . Thyroid disease     Social History Social History   Tobacco Use  . Smoking status: Former Smoker    Packs/day: 0.25    Years: 30.00    Pack years: 7.50    Types: Cigarettes  . Smokeless tobacco: Never Used  . Tobacco comment: 02/25/2017 'quit smoking in the 1990s"  Substance Use Topics  . Alcohol use: No  . Drug use: No    Family History Family History  Problem Relation Age of Onset  . Aneurysm Mother   . Cancer Brother     Surgical History Past Surgical History:  Procedure Laterality Date  . CATARACT EXTRACTION W/ INTRAOCULAR LENS  IMPLANT, BILATERAL Bilateral   . COLONOSCOPY  2004  . ESOPHAGOGASTRODUODENOSCOPY N/A 02/26/2017   Procedure: ESOPHAGOGASTRODUODENOSCOPY (EGD);  Surgeon: Carol Ada, MD;  Location: [facility];  Service: Endoscopy;  Laterality: N/A;  . FLEXIBLE SIGMOIDOSCOPY N/A 03/09/2015   Procedure: FLEXIBLE SIGMOIDOSCOPY;  Surgeon: Carol Ada, MD;  Location: [facility];  Service: Endoscopy;  Laterality: N/A;  . FLEXIBLE SIGMOIDOSCOPY N/A 02/26/2017   Procedure: FLEXIBLE SIGMOIDOSCOPY;  Surgeon:  Carol Ada, MD;  Location: [facility];  Service: Endoscopy;  Laterality: N/A;  . PROSTATE BIOPSY  02/23/2014   Gleason 7, volume 25.98 cc  . TONSILLECTOMY  1953    No Known Allergies  Current Outpatient Medications  Medication Sig Dispense Refill  . amlodipine-olmesartan (AZOR) 10-20 MG tablet Take 1 tablet by mouth daily.    Marland Kitchen aspirin 81 MG chewable tablet Chew 81 mg by mouth daily.    . clotrimazole-betamethasone (LOTRISONE) cream APPLY BETWEEN TOES TWICE A DAY  1  . finasteride (PROSCAR) 5 MG tablet Take 5 mg by mouth daily.    Marland Kitchen levothyroxine (SYNTHROID,  LEVOTHROID) 112 MCG tablet Take 112 mcg by mouth daily before breakfast.    . Multiple Vitamin (MULTIVITAMIN WITH MINERALS) TABS Take 1 tablet by mouth daily.    . tamsulosin (FLOMAX) 0.4 MG CAPS capsule Take 0.4 mg by mouth daily.     No current facility-administered medications for this visit.     ROS: see HPI for pertinent positives and negatives    Physical Examination  Vitals:   11/17/17 1000  BP: 137/70  Pulse: 69  Resp: 18  Temp: 98.1 F (36.7 C)  TempSrc: Oral  SpO2: 98%  Weight: 216 lb (98 kg)  Height: 6' (1.829 m)   Body mass index is 29.29 kg/m.  General: A&O x 3, WDWN, male HEENT: no gross abnormalities.  Pulmonary: Sym exp, good air movt, CTAB, no rales, rhonchi, or wheezing. Cardiac: RRR, Nl S1, S2, no detected Murmur.  Vascular: Vessel Right Left  Radial 2+Palpable 1+Palpable  Brachial Palpable Palpable  Carotid  without bruit  without bruit  Aorta Not palpable N/A  Femoral 2+Palpable 1+Palpable  Popliteal 2+Not palpable Not palpable  PT notPalpable Not Palpable  DP 2+Palpable 1+Palpable   Gastrointestinal: soft, NTND, -G/R, - HSM, - palpable masses, - CVAT. Musculoskeletal: M/S 5/5 throughout, Extremities without ischemic changes. Skin: No rash, no cellulitis, no ulcers.  Neurologic: Pain and light touch intact in extremities, Motor exam as listed above. CN 2-12 intact, some hearing loss.     Non-Invasive Vascular Imaging  Mesenteric Duplex (Date: 11/17/2017):   Ao: not documented   Celiac artery: 200 cm/s PSV, high resisitant  SMA: 233 cm/s  IMA: 317 cm/s  Technically very limited exam due to overlying bowel gas, vessel depth, and obesity   CT October 2018 IMPRESSION: VASCULAR 1. Extensive atherosclerosis of the aorta and its major branches. No aneurysm or occlusion. No dissection. 2. 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 IMA is patent.  NON-VASCULAR No acute findings  in the abdomen or pelvis. Small hiatal hernia. No CT evidence of bowel ischemia, inflammation or obstruction.    Medical Decision Making  Adrian Ellis is a 77 y.o. male who presents with: asymptomatic chronic mesenteric ischemia.  Dr. Donzetta Matters advised that pt should be on a statin; pt is not, will defer to PCP.  He is taking a daily ASA.    Based on his exam and studies, I have offered the patient return in 6 months with mesenteric artery duplex. I advised pt to notify us if he develops post prandial abdominal pain, we will see him sooner if he does.     I discussed in depth with the patient the nature of atherosclerosis, and emphasized the importance of maximal medical management including strict control of blood pressure, blood glucose, and lipid levels, obtaining regular exercise, and cessation of smoking.  The patient is aware that without maximal medical management the underlying atherosclerotic disease process will progress, limiting the benefit of any interventions.  Thank you for allowing [place] to participate in this patient's care.  Clemon Chambers, RN, MSN, FNP-C [facility] Office: [phone]  Clinic MD: Christean Grief  11/17/2017, 10:51 AM

## 2017-11-17 NOTE — Patient Instructions (Signed)
Before your next abdominal ultrasound:  Take two Extra-Strength Gas-X capsules at bedtime the night before the test. Take another two Extra-Strength Gas-X capsules 3 hours before the test.  Avoid gas forming foods and beverages the day before the test.        Chronic Mesenteric Ischemia Mesenteric ischemia is poor blood flow (circulation) in the vessels that supply blood to the stomach, intestines, and liver (mesenteric organs). Chronic mesenteric ischemia, also called mesenteric angina or intestinal angina, is a long-term (chronic) condition. It happens when an artery or vein that provides blood to the mesenteric organs gradually becomes blocked or narrow, restricting the blood supply to the organs. When the blood supply is severely restricted, the mesenteric organs cannot work properly. What are the causes? This condition is commonly caused by fatty deposits that build up in an artery (plaque), which can narrow the artery and restrict blood flow. Other causes include:  Weakened areas in blood vessel walls (aneurysms).  Conditions that cause twisting or inflammation of blood vessels, such as fibromuscular dysplasia or arteritis.  A disorder in which blood clots form in the veins (venous thrombosis).  Scarring and thickening (fibrosis) of blood vessels caused by radiation therapy.  A tear in the aorta, the body's main artery (aortic dissection).  Blood vessel problems after illegal drug use, such as use of cocaine.  Tumors in the nervous system (neurofibromatosis).  Certain autoimmune diseases, such as lupus.  What increases the risk? The following factors may make you more likely to develop this condition:  Being male.  Being over age 73, especially if you have a history of heart problems.  Smoking.  Congestive heart failure.  Irregular heartbeat (arrhythmia).  Having a history of heart attack or stroke.  Diabetes.  High cholesterol.  High blood pressure  (hypertension).  Being overweight or obese.  Kidney disease (renal disease) requiring dialysis.  What are the signs or symptoms? Symptoms of this condition include:  Abdomen (abdominal) pain or cramps that develop 15-60 minutes after a meal. This pain may last for 1-3 hours. Some people may develop a fear of eating because of this symptom.  Weight loss.  Diarrhea.  Bloody stool.  Nausea.  Vomiting.  Bloating.  Abdominal pain after stress or with exercise.  How is this diagnosed? This condition is diagnosed based on:  Your medical history.  A physical exam.  Tests, such as: ? Ultrasound. ? CT scan. ? Blood tests. ? Urine tests. ? An imaging test that involves injecting a dye into your arteries to show blood flow through blood vessels (angiogram). This can help to show if there are any blockages in the vessels that lead to the intestines. ? Passing a small probe through the mouth and into the stomach to measure the output of carbon dioxide (gastric tonometry). This can help to indicate whether there is decreased blood flow to the stomach and intestines.  How is this treated? This condition may be treated with:  Dietary changes such as eating smaller, low-fat, meals more frequently.  Lifestyle changes to treat underlying conditions that contribute to the disease, such as high cholesterol and high blood pressure.  Medicines to reduce blood clotting and increase blood flow.  Surgery to remove the blockage, repair arteries or veins, and restore blood flow. This may involve: ? Angioplasty. This is surgery to widen the affected artery, reduce the blockage, and sometimes insert a small, mesh tube (stent). ? Bypass surgery. This may be done to go around (bypass) the blockage and  reconnect healthy arteries or veins. ? Placing a stent in the affected area. This may be done to help keep blocked arteries open.  Follow these instructions at home: Eating and drinking  Eat a  heart-healthy diet. This includes fresh fruits and vegetables, whole grains, and lean proteins like chicken, fish, eggs, and beans.  Avoid foods that contain a lot of: ? Salt (sodium). ? Sugar. ? Saturated fat (such as red meat). ? Trans fat (such as fried foods).  Stay hydrated. Drink enough fluid to keep your urine clear or pale yellow. Lifestyle  Stay active and get regular exercise as told by your health care provider. Aim for 150 minutes of moderate activity or 75 minutes of vigorous activity a week. Ask your health care provider what activities and forms of exercise are safe for you.  Maintain a healthy weight.  Work with your health care provider to manage your cholesterol.  Manage any other health problems you have, such as high blood pressure, diabetes, or heart rhythm problems.  Do not use any products that contain nicotine or tobacco, such as cigarettes and e-cigarettes. If you need help quitting, ask your health care provider. General instructions  Take over-the-counter and prescription medicines only as told by your health care provider.  Keep all follow-up visits as told by your health care provider. This is important. Contact a health care provider if:  Your symptoms do not improve or they return after treatment.  You have a fever. Get help right away if:  You have severe abdominal pain.  You have severe chest pain.  You have shortness of breath.  You feel weak or dizzy.  You have palpitations.  You have numbness or weakness in your face, arm, or leg.  You are confused.  You have trouble speaking or people have trouble understanding what you are saying.  You are constipated.  You have trouble urinating.  You have blood in your stool.  You have severe nausea, vomiting, or persistent diarrhea. Summary  Mesenteric ischemia is poor circulation in the vessels that supply blood to the the stomach, intestines, and liver (mesenteric organs).  This  condition happens when an artery or vein that provides blood to the mesenteric organs gradually becomes blocked or narrow, restricting the blood supply to the organs.  This condition is commonly caused by fatty deposits that build up in an artery (plaque), which can narrow the artery and restrict blood flow.  You are more likely to develop this condition if you are over age 5 and have a history of heart problems, high blood pressure, diabetes, or high cholesterol.  This condition is usually treated with medicines, dietary and lifestyle changes, and surgery to remove the blockage, repair arteries or veins, and restore blood flow. This information is not intended to replace advice given to you by your health care provider. Make sure you discuss any questions you have with your health care provider. Document Released: 12/23/2010 Document Revised: 04/19/2016 Document Reviewed: 04/19/2016 Elsevier Interactive Patient Education  2017 [employer].

## 2017-11-22 ENCOUNTER — Emergency Department (HOSPITAL_COMMUNITY): Payer: Medicare Other

## 2017-11-22 ENCOUNTER — Encounter (HOSPITAL_COMMUNITY): Payer: Self-pay | Admitting: Emergency Medicine

## 2017-11-22 ENCOUNTER — Other Ambulatory Visit: Payer: Self-pay

## 2017-11-22 ENCOUNTER — Observation Stay (HOSPITAL_COMMUNITY)
Admission: EM | Admit: 2017-11-22 | Discharge: 2017-11-23 | Disposition: A | Discharge status: Home / Self Care | Payer: Medicare Other | Attending: Internal Medicine | Admitting: Internal Medicine

## 2017-11-22 DIAGNOSIS — N400 Enlarged prostate without lower urinary tract symptoms: Secondary | ICD-10-CM | POA: Insufficient documentation

## 2017-11-22 DIAGNOSIS — R55 Syncope and collapse: Principal | ICD-10-CM | POA: Diagnosis present

## 2017-11-22 DIAGNOSIS — Z6828 Body mass index (BMI) 28.0-28.9, adult: Secondary | ICD-10-CM | POA: Insufficient documentation

## 2017-11-22 DIAGNOSIS — S199XXA Unspecified injury of neck, initial encounter: Secondary | ICD-10-CM | POA: Diagnosis not present

## 2017-11-22 DIAGNOSIS — S0083XA Contusion of other part of head, initial encounter: Secondary | ICD-10-CM | POA: Insufficient documentation

## 2017-11-22 DIAGNOSIS — R9431 Abnormal electrocardiogram [ECG] [EKG]: Secondary | ICD-10-CM | POA: Insufficient documentation

## 2017-11-22 DIAGNOSIS — E781 Pure hyperglyceridemia: Secondary | ICD-10-CM | POA: Insufficient documentation

## 2017-11-22 DIAGNOSIS — W1830XA Fall on same level, unspecified, initial encounter: Secondary | ICD-10-CM | POA: Diagnosis not present

## 2017-11-22 DIAGNOSIS — I6523 Occlusion and stenosis of bilateral carotid arteries: Secondary | ICD-10-CM | POA: Insufficient documentation

## 2017-11-22 DIAGNOSIS — E039 Hypothyroidism, unspecified: Secondary | ICD-10-CM | POA: Diagnosis not present

## 2017-11-22 DIAGNOSIS — E860 Dehydration: Secondary | ICD-10-CM | POA: Insufficient documentation

## 2017-11-22 DIAGNOSIS — E1165 Type 2 diabetes mellitus with hyperglycemia: Secondary | ICD-10-CM | POA: Diagnosis not present

## 2017-11-22 DIAGNOSIS — K551 Chronic vascular disorders of intestine: Secondary | ICD-10-CM | POA: Diagnosis not present

## 2017-11-22 DIAGNOSIS — Z923 Personal history of irradiation: Secondary | ICD-10-CM | POA: Diagnosis not present

## 2017-11-22 DIAGNOSIS — Z7982 Long term (current) use of aspirin: Secondary | ICD-10-CM | POA: Insufficient documentation

## 2017-11-22 DIAGNOSIS — S0990XA Unspecified injury of head, initial encounter: Secondary | ICD-10-CM | POA: Diagnosis not present

## 2017-11-22 DIAGNOSIS — M542 Cervicalgia: Secondary | ICD-10-CM | POA: Insufficient documentation

## 2017-11-22 DIAGNOSIS — Z8546 Personal history of malignant neoplasm of prostate: Secondary | ICD-10-CM | POA: Diagnosis not present

## 2017-11-22 DIAGNOSIS — Y92238 Other place in hospital as the place of occurrence of the external cause: Secondary | ICD-10-CM | POA: Insufficient documentation

## 2017-11-22 DIAGNOSIS — I10 Essential (primary) hypertension: Secondary | ICD-10-CM | POA: Insufficient documentation

## 2017-11-22 DIAGNOSIS — E669 Obesity, unspecified: Secondary | ICD-10-CM | POA: Diagnosis not present

## 2017-11-22 DIAGNOSIS — R51 Headache: Secondary | ICD-10-CM | POA: Diagnosis not present

## 2017-11-22 DIAGNOSIS — N179 Acute kidney failure, unspecified: Secondary | ICD-10-CM | POA: Insufficient documentation

## 2017-11-22 DIAGNOSIS — Z87891 Personal history of nicotine dependence: Secondary | ICD-10-CM | POA: Insufficient documentation

## 2017-11-22 LAB — BASIC METABOLIC PANEL
Anion gap: 9 (ref 5–15)
BUN: 12 mg/dL (ref 8–23)
CALCIUM: 9.6 mg/dL (ref 8.9–10.3)
CO2: 27 mmol/L (ref 22–32)
CREATININE: 1.55 mg/dL — AB (ref 0.61–1.24)
Chloride: 103 mmol/L (ref 98–111)
GFR calc Af Amer: 48 mL/min — ABNORMAL LOW (ref >60)
GFR, EST NON AFRICAN AMERICAN: 41 mL/min — AB (ref >60)
GLUCOSE: 138 mg/dL — AB (ref 70–99)
Potassium: 4.7 mmol/L (ref 3.5–5.1)
Sodium: 139 mmol/L (ref 135–145)

## 2017-11-22 LAB — CBC
HCT: 42 % (ref 39.0–52.0)
Hemoglobin: 13.6 g/dL (ref 13.0–17.0)
MCH: 30.5 pg (ref 26.0–34.0)
MCHC: 32.4 g/dL (ref 30.0–36.0)
MCV: 94.2 fL (ref 78.0–100.0)
PLATELETS: 220 10*3/uL (ref 150–400)
RBC: 4.46 MIL/uL (ref 4.22–5.81)
RDW: 12.7 % (ref 11.5–15.5)
WBC: 11.4 10*3/uL — ABNORMAL HIGH (ref 4.0–10.5)

## 2017-11-22 LAB — URINALYSIS, ROUTINE W REFLEX MICROSCOPIC
BACTERIA UA: NONE SEEN
BILIRUBIN URINE: NEGATIVE
GLUCOSE, UA: NEGATIVE mg/dL
KETONES UR: NEGATIVE mg/dL
Leukocytes, UA: NEGATIVE
NITRITE: NEGATIVE
Protein, ur: NEGATIVE mg/dL
SPECIFIC GRAVITY, URINE: 1.003 — AB (ref 1.005–1.030)
pH: 7 (ref 5.0–8.0)

## 2017-11-22 LAB — I-STAT TROPONIN, ED: TROPONIN I, POC: 0.02 ng/mL (ref 0.00–0.08)

## 2017-11-22 LAB — CBG MONITORING, ED: Glucose-Capillary: 111 mg/dL — ABNORMAL HIGH (ref 70–99)

## 2017-11-22 MED ORDER — HYDRALAZINE HCL 20 MG/ML IJ SOLN
5.0000 mg | Freq: Four times a day (QID) | INTRAMUSCULAR | Status: DC | PRN
  Filled 2017-11-22: qty 1

## 2017-11-22 MED ORDER — FINASTERIDE 5 MG PO TABS
5.0000 mg | ORAL_TABLET | Freq: Every day | ORAL | Status: DC
  Administered 2017-11-23: 5 mg via ORAL
  Filled 2017-11-22: qty 1

## 2017-11-22 MED ORDER — LACTATED RINGERS IV SOLN
INTRAVENOUS | Status: AC
  Administered 2017-11-22: 23:00:00 via INTRAVENOUS

## 2017-11-22 MED ORDER — SODIUM CHLORIDE 0.9% FLUSH
3.0000 mL | Freq: Two times a day (BID) | INTRAVENOUS | Status: DC
  Administered 2017-11-22 – 2017-11-23 (×2): 3 mL via INTRAVENOUS

## 2017-11-22 MED ORDER — SODIUM CHLORIDE 0.9 % IV BOLUS
1000.0000 mL | Freq: Once | INTRAVENOUS | Status: AC
  Administered 2017-11-22: 1000 mL via INTRAVENOUS

## 2017-11-22 MED ORDER — ENOXAPARIN SODIUM 40 MG/0.4ML ~~LOC~~ SOLN
40.0000 mg | SUBCUTANEOUS | Status: DC
  Administered 2017-11-22: 40 mg via SUBCUTANEOUS
  Filled 2017-11-22: qty 0.4

## 2017-11-22 MED ORDER — LEVOTHYROXINE SODIUM 112 MCG PO TABS
112.0000 ug | ORAL_TABLET | Freq: Every day | ORAL | Status: DC
  Administered 2017-11-23: 112 ug via ORAL
  Filled 2017-11-22: qty 1

## 2017-11-22 MED ORDER — HYDRALAZINE HCL 20 MG/ML IJ SOLN: 10.0000 mg | Freq: Four times a day (QID) | INTRAMUSCULAR | Status: DC | PRN

## 2017-11-22 MED ORDER — OXYCODONE HCL 5 MG PO TABS: 5.0000 mg | ORAL_TABLET | ORAL | Status: DC | PRN

## 2017-11-22 MED ORDER — ADULT MULTIVITAMIN W/MINERALS CH
1.0000 | ORAL_TABLET | Freq: Every day | ORAL | Status: DC
  Filled 2017-11-22: qty 1

## 2017-11-22 MED ORDER — ACETAMINOPHEN 500 MG PO TABS
1000.0000 mg | ORAL_TABLET | Freq: Three times a day (TID) | ORAL | Status: DC
  Administered 2017-11-22 – 2017-11-23 (×2): 1000 mg via ORAL
  Filled 2017-11-22 (×2): qty 2

## 2017-11-22 MED ORDER — ASPIRIN EC 81 MG PO TBEC
81.0000 mg | DELAYED_RELEASE_TABLET | Freq: Every day | ORAL | Status: DC
  Administered 2017-11-23: 81 mg via ORAL
  Filled 2017-11-22: qty 1

## 2017-11-22 NOTE — ED Triage Notes (Signed)
Pt presents as a visitor of a patient on [address] where he was ambulating and had a syncopal episode.  Pt states he got hot and flushed and doesn't remember anything except waking up on ground.  Staff states pt's pulse was thready and in the 30s upon initial assessment, slowly has come up to 70s.  Patient became diaphoretic.  Patient states he ate breakfast, but has not eaten since then.  VSS at this time.

## 2017-11-22 NOTE — ED Provider Notes (Signed)
[facility] DEPARTMENT Provider Note   CSN: [id number] Arrival date & time: 11/22/17  1444     History   Chief Complaint Chief Complaint  Patient presents with  . Loss of Consciousness    HPI Adrian Ellis is a 77 y.o. male past medical history of hypertension, thyroid disease, prostate cancer who presents for evaluation of syncopal episode that occurred just prior to ED arrival.  Patient was walking in the hospital visiting a patient here who was admitted and had a syncopal episode.  Patient states that he does not recall what exactly happened.  Patient reports that he remembers walking getting very hot flash in the next thing he knows he was waking up on the ground with people looking over him.  He states he does not think he had any chest pain prior to onset of symptoms.  He states that may be a little lightheaded or dizzy prior to fall.  Patient states he is not on blood thinners.  On ED arrival, he complains of some head and neck pain but otherwise no complaints at this time.  Patient denies any fevers, chest pain, difficulty breathing, abdominal pain, numbness/weakness.  The history is provided by the patient.    Past Medical History:  Diagnosis Date  . Hypertension   . Thyroid disease     Patient Active Problem List   Diagnosis Date Noted  . Syncope 11/22/2017    History reviewed. No pertinent surgical history.      Home Medications    Prior to Admission medications   Medication Sig Start Date End Date Taking? Authorizing Provider  amlodipine-olmesartan (AZOR) 10-20 MG tablet Take 1 tablet by mouth daily. 10/31/17  Yes [provider]  aspirin EC 81 MG tablet Take 81 mg by mouth daily.   Yes [provider]  clotrimazole-betamethasone (LOTRISONE) cream Apply 1 application topically as needed (between toes).  08/25/17  Yes [provider]  finasteride (PROSCAR) 5 MG tablet Take 5 mg by mouth daily. 10/28/17  Yes  [provider]  levothyroxine (SYNTHROID, LEVOTHROID) 112 MCG tablet Take 112 mcg by mouth daily. 09/29/17  Yes [provider]  Multiple Vitamins-Minerals (MULTIVITAMIN WITH MINERALS) tablet Take 1 tablet by mouth daily.   Yes [provider]  tamsulosin (FLOMAX) 0.4 MG CAPS capsule Take 0.4 mg by mouth at bedtime. 09/07/17  Yes [provider]    Family History History reviewed. No pertinent family history.  Social History Social History   Tobacco Use  . Smoking status: Former Research scientist (life sciences)  . Smokeless tobacco: Never Used  Substance Use Topics  . Alcohol use: Not Currently  . Drug use: Never     Allergies   Patient has no known allergies.   Review of Systems Review of Systems  Constitutional: Negative for fever.  Eyes: Negative for visual disturbance.  Respiratory: Negative for shortness of breath.   Cardiovascular: Negative for chest pain.  Musculoskeletal: Positive for neck pain.  Neurological: Positive for syncope and headaches. Negative for weakness and numbness.  All other systems reviewed and are negative.    Physical Exam Updated Vital Signs BP (!) 176/99   Pulse 92   Temp (!) 97.5 F (36.4 C) (Oral)   Resp 20   SpO2 98%   Physical Exam  Constitutional: He is oriented to person, place, and time. He appears well-developed and well-nourished.  HENT:  Head: Normocephalic and atraumatic.  Mouth/Throat: Oropharynx is clear and moist and mucous membranes are normal.  Tenderness to palpation noted to posterior scalp.  Tenderness palpation noted overlying the left orbital with small abrasion.  Eyes: Pupils are equal, round, and reactive to light. Conjunctivae, EOM and lids are normal.  EOMs intact without difficulty.   Neck: Full passive range of motion without pain.  Full flexion/extension and lateral movement of neck fully intact. Tenderness noted to the base of the neck. No deformities or crepitus.   Cardiovascular: Normal rate,  regular rhythm, normal heart sounds and normal pulses. Exam reveals no gallop and no friction rub.  No murmur heard. Pulses:      Radial pulses are 2+ on the right side, and 2+ on the left side.       Dorsalis pedis pulses are 2+ on the right side, and 2+ on the left side.  Pulmonary/Chest: Effort normal and breath sounds normal.  Lungs clear to auscultation bilaterally.  Symmetric chest rise.  No wheezing, rales, rhonchi.  Abdominal: Soft. Normal appearance. There is no tenderness. There is no rigidity and no guarding.  Musculoskeletal: Normal range of motion.  Neurological: He is alert and oriented to person, place, and time.  Skin: Skin is warm. Capillary refill takes less than 2 seconds. He is diaphoretic. There is pallor.  Psychiatric: He has a normal mood and affect. His speech is normal.  Nursing note and vitals reviewed.    ED Treatments / Results  Labs (all labs ordered are listed, but only abnormal results are displayed) Labs Reviewed  BASIC METABOLIC PANEL - Abnormal; Notable for the following components:      Result Value   Glucose, Bld 138 (*)    Creatinine, Ser 1.55 (*)    GFR calc non Af Amer 41 (*)    GFR calc Af Amer 48 (*)    All other components within normal limits  CBC - Abnormal; Notable for the following components:   WBC 11.4 (*)    All other components within normal limits  URINALYSIS, ROUTINE W REFLEX MICROSCOPIC - Abnormal; Notable for the following components:   Color, Urine COLORLESS (*)    Specific Gravity, Urine 1.003 (*)    Hgb urine dipstick MODERATE (*)    All other components within normal limits  CBG MONITORING, ED - Abnormal; Notable for the following components:   Glucose-Capillary 111 (*)    All other components within normal limits  I-STAT TROPONIN, ED    EKG EKG Interpretation  Date/Time:  Sunday November 22 2017 15:18:09 EDT Ventricular Rate:  73 PR Interval:    QRS Duration: 88 QT Interval:  373 QTC Calculation: 411 R  Axis:   148 Text Interpretation:  Right and left arm electrode reversal, interpretation assumes no reversal Sinus or ectopic atrial rhythm Borderline prolonged PR interval Right axis deviation Abnormal R-wave progression, early transition Abnormal T, consider ischemia, lateral leads Minimal ST elevation, anterior leads Confirmed by Milton Ferguson [phone]) on 11/22/2017 3:27:52 PM Also confirmed by Milton Ferguson [phone]), editor Philomena Doheny [phone])  on 11/22/2017 4:30:09 PM   Radiology Ct Head Wo Contrast  Result Date: 11/22/2017 CLINICAL DATA:  Syncopal episode, fall.  Neck pain. EXAM: CT HEAD WITHOUT CONTRAST CT CERVICAL SPINE WITHOUT CONTRAST TECHNIQUE: Multidetector CT imaging of the head and cervical spine was performed following the standard protocol without intravenous contrast. Multiplanar CT image reconstructions of the cervical spine were also generated. COMPARISON:  None. FINDINGS: CT HEAD FINDINGS Brain: Ventricles are within normal limits in size and configuration. Patchy areas of chronic small vessel ischemic change noted  within the bilateral periventricular and subcortical white matter regions. There is no mass, hemorrhage, edema or other evidence of acute parenchymal abnormality. No extra-axial hemorrhage. Vascular: There are chronic calcified atherosclerotic changes of the large vessels at the skull base. No unexpected hyperdense vessel. Skull: Normal. Negative for fracture or focal lesion. Sinuses/Orbits: No acute finding. Other: Soft tissue edema/hematoma overlying the LEFT orbit and LEFT lower frontal bone. No underlying fracture. CT CERVICAL SPINE FINDINGS Alignment: Straightening of the normal cervical spine lordosis is likely related to patient positioning or muscle spasm. No evidence of acute vertebral body subluxation. Skull base and vertebrae: No fracture line or displaced fracture fragment seen. Facet joints appear intact and normally aligned throughout. Soft tissues and spinal canal: No  prevertebral fluid or swelling. No visible canal hematoma. Disc levels: Mild disc desiccations within the mid and lower cervical spine, but no more than mild central canal stenosis at any level. Upper chest: No acute findings. Other: Bilateral carotid atherosclerosis. IMPRESSION: 1. Soft tissue edema/hematoma overlying the LEFT orbit and LEFT lower frontal bone. No underlying fracture. 2. No acute intracranial abnormality. No intracranial mass, hemorrhage or edema. No skull fracture. Mild chronic small vessel ischemic changes within the white matter. 3. No fracture or acute subluxation within the cervical spine. Straightening of the normal cervical spine lordosis is likely related to patient positioning or muscle spasm. Mild degenerative change within the mid and lower cervical spine. Electronically Signed   By: Franki Cabot M.D.   On: 11/22/2017 17:37   Ct Cervical Spine Wo Contrast  Result Date: 11/22/2017 CLINICAL DATA:  Syncopal episode, fall.  Neck pain. EXAM: CT HEAD WITHOUT CONTRAST CT CERVICAL SPINE WITHOUT CONTRAST TECHNIQUE: Multidetector CT imaging of the head and cervical spine was performed following the standard protocol without intravenous contrast. Multiplanar CT image reconstructions of the cervical spine were also generated. COMPARISON:  None. FINDINGS: CT HEAD FINDINGS Brain: Ventricles are within normal limits in size and configuration. Patchy areas of chronic small vessel ischemic change noted within the bilateral periventricular and subcortical white matter regions. There is no mass, hemorrhage, edema or other evidence of acute parenchymal abnormality. No extra-axial hemorrhage. Vascular: There are chronic calcified atherosclerotic changes of the large vessels at the skull base. No unexpected hyperdense vessel. Skull: Normal. Negative for fracture or focal lesion. Sinuses/Orbits: No acute finding. Other: Soft tissue edema/hematoma overlying the LEFT orbit and LEFT lower frontal bone. No  underlying fracture. CT CERVICAL SPINE FINDINGS Alignment: Straightening of the normal cervical spine lordosis is likely related to patient positioning or muscle spasm. No evidence of acute vertebral body subluxation. Skull base and vertebrae: No fracture line or displaced fracture fragment seen. Facet joints appear intact and normally aligned throughout. Soft tissues and spinal canal: No prevertebral fluid or swelling. No visible canal hematoma. Disc levels: Mild disc desiccations within the mid and lower cervical spine, but no more than mild central canal stenosis at any level. Upper chest: No acute findings. Other: Bilateral carotid atherosclerosis. IMPRESSION: 1. Soft tissue edema/hematoma overlying the LEFT orbit and LEFT lower frontal bone. No underlying fracture. 2. No acute intracranial abnormality. No intracranial mass, hemorrhage or edema. No skull fracture. Mild chronic small vessel ischemic changes within the white matter. 3. No fracture or acute subluxation within the cervical spine. Straightening of the normal cervical spine lordosis is likely related to patient positioning or muscle spasm. Mild degenerative change within the mid and lower cervical spine. Electronically Signed   By: Franki Cabot M.D.   On:  11/22/2017 17:37    Procedures Procedures (including critical care time)  Medications Ordered in ED Medications  sodium chloride 0.9 % bolus 1,000 mL (0 mLs Intravenous Stopped 11/22/17 1656)     Initial Impression / Assessment and Plan / ED Course  I have reviewed the triage vital signs and the nursing notes.  Pertinent labs & imaging results that were available during my care of the patient were reviewed by me and considered in my medical decision making (see chart for details).     77 year old male with past most of hypertension, hypothyroidism who presents for evaluation of syncopal episode.  Patient reports he is visiting a patient upstairs and states that he became  lightheaded, diaphoretic and dizzy and had a syncopal episode.  Does not recall having any chest pain.  On initial ED evaluation, patient is diaphoretic, pale.  Heart rate is in the 70s.  Plan to check EKG, basic labs.  Given that he hit his head and is complaining of some neck pain, will plan for CT head, CT C-spine.  Per RN report, when patient was first found, he was bradycardic with heart rate in 30s.  Here his vital signs been stable with heart rate in the 70s.  Full pulses in all 4 extremities.  I-STAT troponin negative.  POC glucose is negative.  BMP shows creatinine is 1.55.  His baseline appears to be 1.28.  CBC shows leukocytosis of 11.4.  No anemia.  EKG shows inverted T waves in the lateral leads which is new from previous EKG.  CT head shows small hematoma and left orbital swelling.  No other acute abnormalities.  CT C-spine without any acute bony abnormalities.  Given no conclusive finding of why patient had syncope and with new changes on EKG, feel that best course of action would be to have patient admitted for observation, serial troponins.   Discussed patient with hospitalist. Will admit.   Final Clinical Impressions(s) / ED Diagnoses   Final diagnoses:  Syncope, unspecified syncope type    ED Discharge Orders    None       Desma Mcgregor 11/22/17 1947    Milton Ferguson, MD 11/24/17 [phone]

## 2017-11-22 NOTE — Progress Notes (Addendum)
New Admission Note:   Arrival Method: from ED  Mental Orientation: Alert and oriented x 4 Assessment: Completed Skin: Intact Safety Measures: Safety Fall Prevention Plan has been discussed  Admission:  [facility] Orientation: Patient has been oriented to the room, unit and staff.  Family: none at bedside  Orders to be reviewed and implemented. Will continue to monitor the patient. Call light has been placed within reach and bed alarm has been activated. Patient educated about importance of low bed however patient refusing to change bed stating that he is "ok" In this one and he will call for assistance   Venetia Night, RN Phone: [phone]

## 2017-11-22 NOTE — ED Notes (Signed)
Meal offered

## 2017-11-22 NOTE — H&P (Signed)
History and Physical   Adrian Ellis ZLD:[id number] DOB: 12/24/1940 DOA: 11/22/2017  PCP: Iona Beard, MD  Chief Complaint: passed out  HPI: this 77 year old man with medical problems including obesity, hypertension, hypothyroidism, localized prostate cancer status post treatment with radiation, chronic mesenteric ischemia, baseline creatinine of 1.3, who presents with syncope.  He was visiting church members in the hospital, multiple patients on multiple floors with a minister. Nearing the end of their rounds, he noticed himself becoming dizzy while standing in the patient's room.  A few minutes later while on the elevator he felt weak and passed out. He does not recall any preceding chest pain or dyspnea. He does not recall palpitations. He reports one episode of syncope within the past 5 years that occurred in the setting of prolonged standing grilling out at a church event.  He reports having breakfast, however did not eat lunch.  Emergency medicine team reports that bystanders found his pulse to be thready and heart rate dropped into the 30s. He was also noted be pale and diaphoretic. Subsequently, he was taken to the emergency room for further evaluation.  Other details include he lives by himself, educated through the eighth grade, reports taking some college classes at a community college. Former smoker, reports being independent in his ADLs. He is accompanied by his niece who assists with the history.  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  As her having a heart attack or stroke.  He reports some mild left sided facial tenderness over area of impact following the syncopal episode, he denies chest pain.  ED Course: all signs preceding admission include systolic blood  pressure ranging from 150-176, heart rate normal.  BMP with a creatinine of 1.55. CBC with white count 11.4, otherwise normal.  UA with moderate hemoglobin, otherwise unremarkable.  CT of the head and cervical spine with soft tissue swelling /hematoma overlying the left orbit and left lower frontal bone, no fracture. No fracture or acute subluxation of the cervical spine.  EKG revealed T-wave inversions in lead 1 and aVL which were new compared to prior and October 2016; no evidence of heart block.  Review of Systems: A complete ROS was obtained; pertinent positives negatives are denoted in the HPI. Otherwise, all systems are negative.   Past Medical History:  Diagnosis Date  . Hypertension   . Thyroid disease    Social History   Socioeconomic History  . Marital status: Single    Spouse name: Not on file  . Number of children: Not on file  . Years of education: Not on file  . Highest education level: Not on file  Occupational History  . Not on file  Social Needs  . Financial resource strain: Not on file  . Food insecurity:    Worry: Not on file    Inability: Not on file  . Transportation needs:    Medical: Not on file    Non-medical: Not on file  Tobacco Use  . Smoking status: Former Research scientist (life sciences)  . Smokeless tobacco: Never Used  Substance and Sexual Activity  . Alcohol use: Not Currently  . Drug use: Never  . Sexual activity: Not on file  Lifestyle  . Physical activity:    Days per week: Not on file    Minutes per session: Not on file  . Stress:  Not on file  Relationships  . Social connections:    Talks on phone: Not on file    Gets together: Not on file    Attends religious service: Not on file    Active member of club or organization: Not on file    Attends meetings of clubs or organizations: Not on file    Relationship status: Not on file  . Intimate partner violence:    Fear of current or ex partner: Not on file    Emotionally abused: Not on file    Physically abused:  Not on file    Forced sexual activity: Not on file  Other Topics Concern  . Not on file  Social History Narrative  . Not on file   Family hx: Father had Alzheimer's dementia, mother died of` an aneurysm at age 56.  Physical Exam: Vitals:   11/22/17 2012 11/22/17 2018 11/22/17 2023 11/22/17 2046  BP: (!) 151/79 (!) 173/80 (!) 180/76   Pulse: 88 (!) 101 (!) 101   Resp: 16 19 18    Temp: 97.8 F (36.6 C) 97.7 F (36.5 C) 98 F (36.7 C)   TempSrc: Oral Oral Oral   SpO2: 97% 100% 99%   Height:    6' (1.829 m)   General: Appears calm and comfortable obese / overweight black man, pleasant and cooperative.   ENT: Grossly normal hearing, MMM. Cardiovascular: RRR. No M/R/G. No LE edema.  No bilateral bruits heard. Respiratory: CTA bilaterally. No wheezes or crackles. Normal respiratory effort. Breathing room air. Abdomen: Soft, non-tender. No rebound or guarding. Skin: No rash or induration seen on limited exam. Region just superior to left eye with swelling and abrasion, no bleeding. Musculoskeletal: Grossly normal tone BUE/BLE. Appropriate ROM.  Psychiatric: Grossly normal mood and affect. Neurologic: Moves all extremities in coordinated fashion.  Alert and answering questions appropriately.  I have personally reviewed the following labs, culture data, and imaging studies.  Assessment/Plan:  #Syncope No overt evidence for HF or signs / symptoms of ACS.  Has had one other episode before. 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 However, age > 83 makes other etiologies appropriate to evaluate for.  No overt murmur appreciated on exam. EKG without frank brady or tachyarrhythmia. Orthostatic vital signs do not show orthostatic hypotension. Plan: Admit for monitoring on telemetry, will provide LR at 100 x 8 hrs in event of  under-hydration.  TTE to evaluate mechanical causes.  Will continue to hold tamsulosin and anti-HTN for now.    #Other problems: -HTN: holding CCB and ARB, consider re-start tomorrow -BPH: hold tamsulosin in event it contributed to syncope, continue finasteride -Chronic mesenteric ischemia: follows with vascular, deemed to be asymptomatic for now -Elevated CV risk:continue ASA -Hypothyroidism:update TSH, continue home thyroid hormone -Cr of 1.09: uncertain if this represents CKD stage III or if AKI, evaluate how  Cr responds to hydration and holding anti-HTN agents -Left facial hematoma / inflammation: related to syncope and fall, scheduled acetaminophen, oxycodone for breakthrough pain, ice to affected area, holding on NSAIDs given elevated Cr   DVT prophylaxis: Subq Lovenox Code Status: Full Disposition Plan: Anticipate D/C home as early as tomorrow Consults called: none Admission status: admit to hospital medicine   Cheri Rous, MD [facility] Page:[phone]  If 7PM-7AM, please contact night-coverage [email or web address] Password [email or web address]

## 2017-11-23 ENCOUNTER — Other Ambulatory Visit: Payer: Self-pay

## 2017-11-23 ENCOUNTER — Encounter: Payer: Self-pay | Admitting: Family

## 2017-11-23 ENCOUNTER — Ambulatory Visit (HOSPITAL_BASED_OUTPATIENT_CLINIC_OR_DEPARTMENT_OTHER): Payer: Medicare Other

## 2017-11-23 DIAGNOSIS — R55 Syncope and collapse: Secondary | ICD-10-CM

## 2017-11-23 DIAGNOSIS — I5030 Unspecified diastolic (congestive) heart failure: Secondary | ICD-10-CM | POA: Diagnosis not present

## 2017-11-23 DIAGNOSIS — I10 Essential (primary) hypertension: Secondary | ICD-10-CM | POA: Diagnosis not present

## 2017-11-23 DIAGNOSIS — N179 Acute kidney failure, unspecified: Secondary | ICD-10-CM | POA: Diagnosis not present

## 2017-11-23 DIAGNOSIS — E860 Dehydration: Secondary | ICD-10-CM | POA: Diagnosis not present

## 2017-11-23 DIAGNOSIS — E039 Hypothyroidism, unspecified: Secondary | ICD-10-CM | POA: Diagnosis not present

## 2017-11-23 DIAGNOSIS — I129 Hypertensive chronic kidney disease with stage 1 through stage 4 chronic kidney disease, or unspecified chronic kidney disease: Secondary | ICD-10-CM | POA: Diagnosis not present

## 2017-11-23 DIAGNOSIS — N189 Chronic kidney disease, unspecified: Secondary | ICD-10-CM | POA: Diagnosis not present

## 2017-11-23 DIAGNOSIS — K551 Chronic vascular disorders of intestine: Secondary | ICD-10-CM

## 2017-11-23 LAB — LIPID PANEL
Cholesterol: 133 mg/dL (ref 0–200)
HDL: 29 mg/dL — AB (ref >40)
LDL Cholesterol: 48 mg/dL (ref 0–99)
TRIGLYCERIDES: 282 mg/dL — AB (ref <150)
Total CHOL/HDL Ratio: 4.6 RATIO
VLDL: 56 mg/dL — ABNORMAL HIGH (ref 0–40)

## 2017-11-23 LAB — CBC
HEMATOCRIT: 42.5 % (ref 39.0–52.0)
Hemoglobin: 13.6 g/dL (ref 13.0–17.0)
MCH: 30.3 pg (ref 26.0–34.0)
MCHC: 32 g/dL (ref 30.0–36.0)
MCV: 94.7 fL (ref 78.0–100.0)
Platelets: 225 10*3/uL (ref 150–400)
RBC: 4.49 MIL/uL (ref 4.22–5.81)
RDW: 12.9 % (ref 11.5–15.5)
WBC: 9.1 10*3/uL (ref 4.0–10.5)

## 2017-11-23 LAB — HEMOGLOBIN A1C
HEMOGLOBIN A1C: 7 % — AB (ref 4.8–5.6)
MEAN PLASMA GLUCOSE: 154.2 mg/dL

## 2017-11-23 LAB — BASIC METABOLIC PANEL
Anion gap: 6 (ref 5–15)
BUN: 12 mg/dL (ref 8–23)
CALCIUM: 9.3 mg/dL (ref 8.9–10.3)
CO2: 29 mmol/L (ref 22–32)
Chloride: 107 mmol/L (ref 98–111)
Creatinine, Ser: 1.23 mg/dL (ref 0.61–1.24)
GFR calc Af Amer: >60 mL/min (ref >60)
GFR, EST NON AFRICAN AMERICAN: 54 mL/min — AB (ref >60)
GLUCOSE: 167 mg/dL — AB (ref 70–99)
POTASSIUM: 4.8 mmol/L (ref 3.5–5.1)
SODIUM: 142 mmol/L (ref 135–145)

## 2017-11-23 LAB — TSH: TSH: 1.343 u[IU]/mL (ref 0.350–4.500)

## 2017-11-23 MED ORDER — AMLODIPINE BESYLATE 10 MG PO TABS
10.0000 mg | ORAL_TABLET | Freq: Every day | ORAL | Status: DC
  Administered 2017-11-23: 10 mg via ORAL
  Filled 2017-11-23: qty 1

## 2017-11-23 MED ORDER — ADULT MULTIVITAMIN W/MINERALS CH
1.0000 | ORAL_TABLET | Freq: Every day | ORAL | Status: DC
  Administered 2017-11-23: 1 via ORAL
  Filled 2017-11-23: qty 1

## 2017-11-23 MED ORDER — IRBESARTAN 300 MG PO TABS
150.0000 mg | ORAL_TABLET | Freq: Every day | ORAL | Status: DC
  Administered 2017-11-23: 150 mg via ORAL
  Filled 2017-11-23: qty 1

## 2017-11-23 MED ORDER — TAMSULOSIN HCL 0.4 MG PO CAPS: 0.4000 mg | ORAL_CAPSULE | Freq: Every day | ORAL | Status: DC

## 2017-11-23 MED ORDER — PERFLUTREN LIPID MICROSPHERE
1.0000 mL | INTRAVENOUS | Status: DC | PRN
  Administered 2017-11-23: 2 mL via INTRAVENOUS
  Filled 2017-11-23: qty 10

## 2017-11-23 MED ORDER — AMLODIPINE-OLMESARTAN 10-20 MG PO TABS: 1.0000 | ORAL_TABLET | Freq: Every day | ORAL | Status: DC

## 2017-11-23 NOTE — Consult Note (Signed)
CARDIOLOGY CONSULT NOTE  Patient ID: Kennen Stammer MRN: [id number] DOB/AGE: 78/07/1940 78 y.o.  Admit date: 11/22/2017 Referring Physician  Cammy Copa, MD Primary Physician:  Iona Beard, MD Reason for Consultation  Syncope  HPI: Tor Tsuda  is a 77 y.o. male  With Hypertension, otherwise no other significant cardiac history, had been doing well until day of admission, while walking in the hallway suddenly felt dizzy, diaphoretic and had an episode of syncope.  Brought to the emergency room for further evaluation.  He has history of hypothyroidism, history of prostate cancer in the past.  He also has history of mesenteric vascular disease and has been evaluated by vascular surgery in July 2019 and recommended medical therapy.  He was found to be in mild renal insufficiency and admitted for further evaluation with abnormal EKG.  Presently remains asymptomatic.  Past Medical History:  Diagnosis Date  . Hypertension   . Thyroid disease      History reviewed. No pertinent surgical history.   History reviewed. No pertinent family history.   Social History: Social History   Socioeconomic History  . Marital status: Single    Spouse name: Not on file  . Number of children: Not on file  . Years of education: Not on file  . Highest education level: Not on file  Occupational History  . Not on file  Social Needs  . Financial resource strain: Not on file  . Food insecurity:    Worry: Not on file    Inability: Not on file  . Transportation needs:    Medical: Not on file    Non-medical: Not on file  Tobacco Use  . Smoking status: Former Research scientist (life sciences)  . Smokeless tobacco: Never Used  Substance and Sexual Activity  . Alcohol use: Not Currently  . Drug use: Never  . Sexual activity: Not on file  Lifestyle  . Physical activity:    Days per week: Not on file    Minutes per session: Not on file  . Stress: Not on file  Relationships  . Social connections:    Talks on phone: Not  on file    Gets together: Not on file    Attends religious service: Not on file    Active member of club or organization: Not on file    Attends meetings of clubs or organizations: Not on file    Relationship status: Not on file  . Intimate partner violence:    Fear of current or ex partner: Not on file    Emotionally abused: Not on file    Physically abused: Not on file    Forced sexual activity: Not on file  Other Topics Concern  . Not on file  Social History Narrative  . Not on file     Medications Prior to Admission  Medication Sig Dispense Refill Last Dose  . amlodipine-olmesartan (AZOR) 10-20 MG tablet Take 1 tablet by mouth daily.  1 11/22/2017 at Unknown time  . aspirin EC 81 MG tablet Take 81 mg by mouth daily.   11/22/2017 at Unknown time  . clotrimazole-betamethasone (LOTRISONE) cream Apply 1 application topically as needed (between toes).   1 Past Month at prn  . finasteride (PROSCAR) 5 MG tablet Take 5 mg by mouth daily.  11 11/22/2017 at Unknown time  . levothyroxine (SYNTHROID, LEVOTHROID) 112 MCG tablet Take 112 mcg by mouth daily.   11/22/2017 at 0800  . Multiple Vitamins-Minerals (MULTIVITAMIN WITH MINERALS) tablet Take 1 tablet by mouth daily.  11/22/2017 at Unknown time  . tamsulosin (FLOMAX) 0.4 MG CAPS capsule Take 0.4 mg by mouth at bedtime.  11 11/21/2017 at Unknown time   Cardiovascular ROS: No shortness of breath, no chest pain, no headache or visual disturbance, no neurologic deficits.  No frequent urination, no heat or cold intolerance.  Other systems negative.  Physical Exam: Blood pressure (!) 149/77, pulse 72, temperature 97.8 F (36.6 C), temperature source Oral, resp. rate 18, height 6' (1.829 m), weight 96.3 kg (212 lb 4.8 oz), SpO2 99 %. Body mass index is 28.79 kg/m.  General appearance: alert, cooperative, appears stated age and no distress Lungs: clear to auscultation bilaterally Chest wall: no tenderness Heart: regular rate and rhythm, S1, S2 normal,  no murmur, click, rub or gallop Abdomen: soft, non-tender; bowel sounds normal; no masses,  no organomegaly and obese Extremities: extremities normal, atraumatic, no cyanosis or edema Pulses: 2+ and symmetric Neurologic: Grossly normal  Labs:   Lab Results  Component Value Date   WBC 9.1 11/23/2017   HGB 13.6 11/23/2017   HCT 42.5 11/23/2017   MCV 94.7 11/23/2017   PLT 225 11/23/2017    Recent Labs  Lab 11/23/17 0519  NA 142  K 4.8  CL 107  CO2 29  BUN 12  CREATININE 1.23  CALCIUM 9.3  GLUCOSE 167*   TSH Recent Labs    11/23/17 0519  TSH 1.343    Radiology: Ct Head Wo Contrast  Result Date: 11/22/2017 CLINICAL DATA:  Syncopal episode, fall.  Neck pain. EXAM: CT HEAD WITHOUT CONTRAST CT CERVICAL SPINE WITHOUT CONTRAST TECHNIQUE: Multidetector CT imaging of the head and cervical spine was performed following the standard protocol without intravenous contrast. Multiplanar CT image reconstructions of the cervical spine were also generated. COMPARISON:  None. FINDINGS: CT HEAD FINDINGS Brain: Ventricles are within normal limits in size and configuration. Patchy areas of chronic small vessel ischemic change noted within the bilateral periventricular and subcortical white matter regions. There is no mass, hemorrhage, edema or other evidence of acute parenchymal abnormality. No extra-axial hemorrhage. Vascular: There are chronic calcified atherosclerotic changes of the large vessels at the skull base. No unexpected hyperdense vessel. Skull: Normal. Negative for fracture or focal lesion. Sinuses/Orbits: No acute finding. Other: Soft tissue edema/hematoma overlying the LEFT orbit and LEFT lower frontal bone. No underlying fracture. CT CERVICAL SPINE FINDINGS Alignment: Straightening of the normal cervical spine lordosis is likely related to patient positioning or muscle spasm. No evidence of acute vertebral body subluxation. Skull base and vertebrae: No fracture line or displaced fracture  fragment seen. Facet joints appear intact and normally aligned throughout. Soft tissues and spinal canal: No prevertebral fluid or swelling. No visible canal hematoma. Disc levels: Mild disc desiccations within the mid and lower cervical spine, but no more than mild central canal stenosis at any level. Upper chest: No acute findings. Other: Bilateral carotid atherosclerosis. IMPRESSION: 1. Soft tissue edema/hematoma overlying the LEFT orbit and LEFT lower frontal bone. No underlying fracture. 2. No acute intracranial abnormality. No intracranial mass, hemorrhage or edema. No skull fracture. Mild chronic small vessel ischemic changes within the white matter. 3. No fracture or acute subluxation within the cervical spine. Straightening of the normal cervical spine lordosis is likely related to patient positioning or muscle spasm. Mild degenerative change within the mid and lower cervical spine. Electronically Signed   By: Franki Cabot M.D.   On: 11/22/2017 17:37   Ct Cervical Spine Wo Contrast  Result Date: 11/22/2017 CLINICAL DATA:  Syncopal episode, fall.  Neck pain. EXAM: CT HEAD WITHOUT CONTRAST CT CERVICAL SPINE WITHOUT CONTRAST TECHNIQUE: Multidetector CT imaging of the head and cervical spine was performed following the standard protocol without intravenous contrast. Multiplanar CT image reconstructions of the cervical spine were also generated. COMPARISON:  None. FINDINGS: CT HEAD FINDINGS Brain: Ventricles are within normal limits in size and configuration. Patchy areas of chronic small vessel ischemic change noted within the bilateral periventricular and subcortical white matter regions. There is no mass, hemorrhage, edema or other evidence of acute parenchymal abnormality. No extra-axial hemorrhage. Vascular: There are chronic calcified atherosclerotic changes of the large vessels at the skull base. No unexpected hyperdense vessel. Skull: Normal. Negative for fracture or focal lesion. Sinuses/Orbits: No  acute finding. Other: Soft tissue edema/hematoma overlying the LEFT orbit and LEFT lower frontal bone. No underlying fracture. CT CERVICAL SPINE FINDINGS Alignment: Straightening of the normal cervical spine lordosis is likely related to patient positioning or muscle spasm. No evidence of acute vertebral body subluxation. Skull base and vertebrae: No fracture line or displaced fracture fragment seen. Facet joints appear intact and normally aligned throughout. Soft tissues and spinal canal: No prevertebral fluid or swelling. No visible canal hematoma. Disc levels: Mild disc desiccations within the mid and lower cervical spine, but no more than mild central canal stenosis at any level. Upper chest: No acute findings. Other: Bilateral carotid atherosclerosis. IMPRESSION: 1. Soft tissue edema/hematoma overlying the LEFT orbit and LEFT lower frontal bone. No underlying fracture. 2. No acute intracranial abnormality. No intracranial mass, hemorrhage or edema. No skull fracture. Mild chronic small vessel ischemic changes within the white matter. 3. No fracture or acute subluxation within the cervical spine. Straightening of the normal cervical spine lordosis is likely related to patient positioning or muscle spasm. Mild degenerative change within the mid and lower cervical spine. Electronically Signed   By: Franki Cabot M.D.   On: 11/22/2017 17:37    Scheduled Meds: . acetaminophen  1,000 mg Oral TID  . amLODipine  10 mg Oral Daily   And  . irbesartan  150 mg Oral Daily  . aspirin EC  81 mg Oral Daily  . enoxaparin (LOVENOX) injection  40 mg Subcutaneous Q24H  . finasteride  5 mg Oral Daily  . levothyroxine  112 mcg Oral QAC breakfast  . multivitamin with minerals  1 tablet Oral Daily  . sodium chloride flush  3 mL Intravenous Q12H  . tamsulosin  0.4 mg Oral QHS   Continuous Infusions: PRN Meds:.hydrALAZINE, oxyCODONE  CARDIAC STUDIES:  EKG: 11/23/2017: NSR. Compared to yesterday's EKG, lead reversal  not present.  ECHO 11/23/2017:  Left ventricle: The cavity size was normal. Wall thickness was  increased in a pattern of moderate LVH. Systolic function was  vigorous. The estimated ejection fraction was in the range of 65%  to 70%. Wall motion was normal; there were no regional wall  motion abnormalities. Doppler parameters are consistent with  abnormal left ventricular relaxation (grade 1 diastolic  dysfunction).  ASSESSMENT AND PLAN:  1. Vasovagal syncope 2. Hypertension 3. Acute stage 3 kidney failure due to dehydration. 4. Hyperglycemia/DM 5. Obesity. 6.  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  Rec: He can be discharged home with out patient evaluation. I have discussed with patient regarding weight loss and BP control. Hyperglycemia discussed and will need A1c (ordered). Add amlodipine for hypertension.  After the patient's discharge, I reviewed his labs, patient's A1c 7.0%, patient is diabetic.  Patient has hypertriglyceridemia related  to excessive calories and diet, TSH is normal.  I will forward my notes to his PCP.  Adrian Prows, MD 11/23/2017, 12:52 PM [facility] [place] Pager: [phone] Office: [phone] If no answer Cell [phone]

## 2017-11-23 NOTE — Progress Notes (Signed)
  Echocardiogram 2D Echocardiogram has been performed.  Jennette Dubin 11/23/2017, 2:31 PM

## 2017-11-23 NOTE — Progress Notes (Signed)
Martina Sinner discharged home with friend. AVS went over with and given to patient.  Signed copy in chart.  Patient taken to discharge lobby via wheelchair by RN.   Vitals:   11/23/17 0156 11/23/17 0417  BP: (!) 161/71 (!) 149/77  Pulse: 75 72  Resp:  18  Temp:  97.8 F (36.6 C)  SpO2:  99%     Julieanne Cotton, RN

## 2017-11-23 NOTE — Progress Notes (Signed)
PROGRESS NOTE    Adrian Ellis  PJK:[id number] DOB: [birth year] DOA: 11/22/2017 PCP: Iona Beard, MD Brief Narrative:77 year old man with medical problems including obesity, hypertension, hypothyroidism, localized prostate cancer status post treatment with radiation, chronic mesenteric ischemia, baseline creatinine of 1.3, who presents with syncope.  He was visiting church members in the hospital, multiple patients on multiple floors with a minister. Nearing the end of their rounds, he noticed himself becoming dizzy while standing in the patient's room.  A few minutes later while on the elevator he felt weak and passed out. He does not recall any preceding chest pain or dyspnea. He does not recall palpitations. He reports one episode of syncope within the past 5 years that occurred in the setting of prolonged standing grilling out at a church event.  He reports having breakfast, however did not eat lunch.  Emergency medicine team reports that bystanders found his pulse to be thready and heart rate dropped into the 30s. He was also noted be pale and diaphoretic. Subsequently, he was taken to the emergency room for further evaluation.  Other details include he lives by himself, educated through the eighth grade, reports taking some college classes at a community college. Former smoker, reports being independent in his ADLs. He is accompanied by his niece who assists with the history.  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  As her having a heart attack or stroke.  He reports some mild left sided facial tenderness over area of impact following the syncopal episode, he denies chest pain.  ED Course: all signs preceding admission include systolic blood pressure ranging from 150-176,  heart rate normal.  BMP with a creatinine of 1.55. CBC with white count 11.4, otherwise normal.  UA with moderate hemoglobin, otherwise unremarkable.  CT of the head and cervical spine with soft tissue swelling /hematoma overlying the left orbit and left lower frontal bone, no fracture. No fracture or acute subluxation of the cervical spine.  EKG revealed T-wave inversions in lead 1 and aVL which were new compared to prior and October 2016; no evidence of heart block.     Assessment & Plan:   Active Problems:   Syncope  1] syncope most likely vasovagal.  However with his age and comorbidities would like to rule out cardiac etiology. Patient has never seen a cardiologist or has had any cardiac work-up.  Patient was was not orthostatic at the time of admission. echo Ordered for today.   2] hypertension restart home meds.  3] BPH restart Flomax continue finasteride.  4] hypothyroidism continue Synthroid.  5] elevated creatinine with no baseline however creatinine is improving with slow IV hydration he received last night..  Follow-up levels tomorrow.  6] left facial hematoma status post syncope fracture by CT scan.  7] chronic mesenteric ischemia asymptomatic at this time.    DVT prophylaxis: Lovenox Code Status full code Family Communication: None  Disposition Plan: Pending echo and cardiology consult Consultants:  cardiology Procedures: None Antimicrobials: None  Subjective: Denies any chest pain, shortness of breath, nausea, vomiting, headaches or changes with his vision.   Objective: Vitals:   11/22/17 2236 11/23/17 0005 11/23/17 0156 11/23/17 0417  BP: (!) 153/70 (!) 185/78 (!) 161/71 (!) 149/77  Pulse: 89 81 75 72  Resp:  18  18  Temp:  (!) 97.5 F (  36.4 C)  97.8 F (36.6 C)  TempSrc:  Oral  Oral  SpO2:  100%  99%  Weight:    96.3 kg (212 lb 4.8 oz)  Height:        Intake/Output Summary (Last 24 hours) at 11/23/2017 1029 Last data filed at 11/23/2017 0814 Gross  per 24 hour  Intake 2297.51 ml  Output 2000 ml  Net 297.51 ml   Filed Weights   11/22/17 2046 11/23/17 0417  Weight: 97.8 kg (215 lb 9.6 oz) 96.3 kg (212 lb 4.8 oz)    Examination:  General exam: Appears calm and comfortable  Respiratory system: Clear to auscultation. Respiratory effort normal. Cardiovascular system: S1 & S2 heard, RRR. No JVD, murmurs, rubs, gallops or clicks. No pedal edema. Gastrointestinal system: Abdomen is nondistended, soft and nontender. No organomegaly or masses felt. Normal bowel sounds heard. Central nervous system: Alert and oriented. No focal neurological deficits. Extremities: Symmetric 5 x 5 power.no edema Skin: No rashes, lesions or ulcers Psychiatry: Judgement and insight appear normal. Mood & affect appropriate.     Data Reviewed: I have personally reviewed following labs and imaging studies  CBC: Recent Labs  Lab 11/22/17 1547 11/23/17 0519  WBC 11.4* 9.1  HGB 13.6 13.6  HCT 42.0 42.5  MCV 94.2 94.7  PLT 220 409   Basic Metabolic Panel: Recent Labs  Lab 11/22/17 1547 11/23/17 0519  NA 139 142  K 4.7 4.8  CL 103 107  CO2 27 29  GLUCOSE 138* 167*  BUN 12 12  CREATININE 1.55* 1.23  CALCIUM 9.6 9.3   GFR: Estimated Creatinine Clearance: 59.6 mL/min (by C-G formula based on SCr of 1.23 mg/dL). Liver Function Tests: No results for input(s): AST, ALT, ALKPHOS, BILITOT, PROT, ALBUMIN in the last 168 hours. No results for input(s): LIPASE, AMYLASE in the last 168 hours. No results for input(s): AMMONIA in the last 168 hours. Coagulation Profile: No results for input(s): INR, PROTIME in the last 168 hours. Cardiac Enzymes: No results for input(s): CKTOTAL, CKMB, CKMBINDEX, TROPONINI in the last 168 hours. BNP (last 3 results) No results for input(s): PROBNP in the last 8760 hours. HbA1C: No results for input(s): HGBA1C in the last 72 hours. CBG: Recent Labs  Lab 11/22/17 1519  GLUCAP 111*   Lipid Profile: No results for  input(s): CHOL, HDL, LDLCALC, TRIG, CHOLHDL, LDLDIRECT in the last 72 hours. Thyroid Function Tests: Recent Labs    11/23/17 0519  TSH 1.343   Anemia Panel: No results for input(s): VITAMINB12, FOLATE, FERRITIN, TIBC, IRON, RETICCTPCT in the last 72 hours. Sepsis Labs: No results for input(s): PROCALCITON, LATICACIDVEN in the last 168 hours.  No results found for this or any previous visit (from the past 240 hour(s)).       Radiology Studies: Ct Head Wo Contrast  Result Date: 11/22/2017 CLINICAL DATA:  Syncopal episode, fall.  Neck pain. EXAM: CT HEAD WITHOUT CONTRAST CT CERVICAL SPINE WITHOUT CONTRAST TECHNIQUE: Multidetector CT imaging of the head and cervical spine was performed following the standard protocol without intravenous contrast. Multiplanar CT image reconstructions of the cervical spine were also generated. COMPARISON:  None. FINDINGS: CT HEAD FINDINGS Brain: Ventricles are within normal limits in size and configuration. Patchy areas of chronic small vessel ischemic change noted within the bilateral periventricular and subcortical white matter regions. There is no mass, hemorrhage, edema or other evidence of acute parenchymal abnormality. No extra-axial hemorrhage. Vascular: There are chronic calcified atherosclerotic changes of the large vessels at the skull  base. No unexpected hyperdense vessel. Skull: Normal. Negative for fracture or focal lesion. Sinuses/Orbits: No acute finding. Other: Soft tissue edema/hematoma overlying the LEFT orbit and LEFT lower frontal bone. No underlying fracture. CT CERVICAL SPINE FINDINGS Alignment: Straightening of the normal cervical spine lordosis is likely related to patient positioning or muscle spasm. No evidence of acute vertebral body subluxation. Skull base and vertebrae: No fracture line or displaced fracture fragment seen. Facet joints appear intact and normally aligned throughout. Soft tissues and spinal canal: No prevertebral fluid or  swelling. No visible canal hematoma. Disc levels: Mild disc desiccations within the mid and lower cervical spine, but no more than mild central canal stenosis at any level. Upper chest: No acute findings. Other: Bilateral carotid atherosclerosis. IMPRESSION: 1. Soft tissue edema/hematoma overlying the LEFT orbit and LEFT lower frontal bone. No underlying fracture. 2. No acute intracranial abnormality. No intracranial mass, hemorrhage or edema. No skull fracture. Mild chronic small vessel ischemic changes within the white matter. 3. No fracture or acute subluxation within the cervical spine. Straightening of the normal cervical spine lordosis is likely related to patient positioning or muscle spasm. Mild degenerative change within the mid and lower cervical spine. Electronically Signed   By: Franki Cabot M.D.   On: 11/22/2017 17:37   Ct Cervical Spine Wo Contrast  Result Date: 11/22/2017 CLINICAL DATA:  Syncopal episode, fall.  Neck pain. EXAM: CT HEAD WITHOUT CONTRAST CT CERVICAL SPINE WITHOUT CONTRAST TECHNIQUE: Multidetector CT imaging of the head and cervical spine was performed following the standard protocol without intravenous contrast. Multiplanar CT image reconstructions of the cervical spine were also generated. COMPARISON:  None. FINDINGS: CT HEAD FINDINGS Brain: Ventricles are within normal limits in size and configuration. Patchy areas of chronic small vessel ischemic change noted within the bilateral periventricular and subcortical white matter regions. There is no mass, hemorrhage, edema or other evidence of acute parenchymal abnormality. No extra-axial hemorrhage. Vascular: There are chronic calcified atherosclerotic changes of the large vessels at the skull base. No unexpected hyperdense vessel. Skull: Normal. Negative for fracture or focal lesion. Sinuses/Orbits: No acute finding. Other: Soft tissue edema/hematoma overlying the LEFT orbit and LEFT lower frontal bone. No underlying fracture. CT  CERVICAL SPINE FINDINGS Alignment: Straightening of the normal cervical spine lordosis is likely related to patient positioning or muscle spasm. No evidence of acute vertebral body subluxation. Skull base and vertebrae: No fracture line or displaced fracture fragment seen. Facet joints appear intact and normally aligned throughout. Soft tissues and spinal canal: No prevertebral fluid or swelling. No visible canal hematoma. Disc levels: Mild disc desiccations within the mid and lower cervical spine, but no more than mild central canal stenosis at any level. Upper chest: No acute findings. Other: Bilateral carotid atherosclerosis. IMPRESSION: 1. Soft tissue edema/hematoma overlying the LEFT orbit and LEFT lower frontal bone. No underlying fracture. 2. No acute intracranial abnormality. No intracranial mass, hemorrhage or edema. No skull fracture. Mild chronic small vessel ischemic changes within the white matter. 3. No fracture or acute subluxation within the cervical spine. Straightening of the normal cervical spine lordosis is likely related to patient positioning or muscle spasm. Mild degenerative change within the mid and lower cervical spine. Electronically Signed   By: Franki Cabot M.D.   On: 11/22/2017 17:37        Scheduled Meds: . acetaminophen  1,000 mg Oral TID  . aspirin EC  81 mg Oral Daily  . enoxaparin (LOVENOX) injection  40 mg Subcutaneous Q24H  .  finasteride  5 mg Oral Daily  . levothyroxine  112 mcg Oral QAC breakfast  . multivitamin with minerals  1 tablet Oral Daily  . sodium chloride flush  3 mL Intravenous Q12H   Continuous Infusions:   LOS: 0 days     Georgette Shell, MD [facility]  If 7PM-7AM, please contact night-coverage [email or web address] Password [email or web address] 11/23/2017, 10:29 AM

## 2017-11-23 NOTE — Progress Notes (Signed)
Dr. Rodena Piety paged about reconciling patient's medication record for discharge. Awaiting response.

## 2017-11-23 NOTE — Plan of Care (Signed)
  Problem: Education: Goal: Knowledge of General Education information will improve Outcome: Progressing   Problem: Health Behavior/Discharge Planning: Goal: Ability to manage health-related needs will improve Outcome: Progressing   Problem: Clinical Measurements: Goal: Will remain free from infection Outcome: Progressing   

## 2017-11-24 NOTE — Discharge Summary (Addendum)
Physician Discharge Summary  Jaedin Regina JGO:[id number] DOB: Nov 27, 1940 DOA: 11/22/2017  PCP: Iona Beard, MD  Admit date: 11/22/2017 Discharge date: 11/24/2017  Admitted From: Home  Disposition: Home  Recommendations for Outpatient Follow-up:  1. Follow up with PCP in 1-2 weeks 2. Please obtain BMP/CBC in one week 3. Follow-up with cardiology Dr. Einar Gip.  Home Healt[facility] none Equipment/Devices: None  Discharge Condition: Stable CODE STATUS: Full code Diet recommendation: Cardiac  Brief/Interim Summary:77 year old man with medical problems including obesity, hypertension, hypothyroidism, localized prostate cancer status post treatment with radiation, chronic mesenteric ischemia, baseline creatinine of 1.3, who presents with syncope.  He was visiting church members in the hospital, multiple patients on multiple floors with a minister. Nearing the end of their rounds, he noticed himself becoming dizzy while standing in the patient's room. A few minutes later while on the elevator he felt weak and passed out. He does not recall any preceding chest pain or dyspnea. He does not recall palpitations. He reports one episode of syncope within the past 5 years that occurred in the setting of prolonged standing grilling out at a church event.  He reports having breakfast, however did not eat lunch.  Emergency medicine team reports that bystanders found his pulse to be thready and heart rate dropped into the 30s. He was also noted be pale and diaphoretic. Subsequently, he was taken to the emergency room for further evaluation.  Other details include he lives by himself, educated through the eighth grade, reports taking some college classes at a community college. Former smoker, reports being independent in his ADLs. He is accompanied by his niece who assists with the history.  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  As her having a heart attack or stroke. He reports some mild left sided facial tenderness over area of impact following the syncopal episode, he denies chest pain.  ED Course:all signs preceding admission include systolic blood pressure ranging from 150-176, heart rate normal. BMP with a creatinine of 1.55. CBC with white count 11.4, otherwise normal. UA with moderate hemoglobin, otherwise unremarkable. CT of the head and cervical spine with soft tissue swelling /hematoma overlying the left orbit and left lower frontal bone, no fracture. No fracture or acute subluxation of the cervical spine. EKG revealed T-wave inversions in lead 1 and aVL which were new compared to prior and October 2016; no evidence of heart block.     Discharge Diagnoses:  Active Problems:   Syncope  1] syncope most likely vasovagal.  Echo Left ventricle: The cavity size was normal. Wall thickness was   increased in a pattern of moderate LVH. Systolic function was   vigorous. The estimated ejection fraction was in the range of 65%   to 70%. Wall motion was normal; there were no regional wall   motion abnormalities. Doppler parameters are consistent with   abnormal left ventricular relaxation (grade 1 diastolic   dysfunction).  Impressions:  - Definity used; vigorous LV systolic function; mild diastolic   dysfunction; moderate LVH.  2] hypertension restart home meds.  3] BPH restart Flomax continue finasteride.  4] hypothyroidism continue Synthroid.  5] elevated creatinine with no baseline however creatinine is improving with slow IV hydration he received last night..  Follow-up levels tomorrow.  6] left facial hematoma status post syncope fracture by CT scan.  7] chronic mesenteric ischemia asymptomatic at this time.  8]DM new onset hba1c 7.0.placed a call  to pcp.advised diet control and follow up with pcp.     Discharge Instructions  Discharge Instructions    Call MD for:  difficulty breathing, headache or visual disturbances   Complete by:  As directed    Call MD for:  extreme fatigue   Complete by:  As directed    Call MD for:  persistant nausea and vomiting   Complete by:  As directed    Call MD for:  redness, tenderness, or signs of infection (pain, swelling, redness, odor or green/yellow discharge around incision site)   Complete by:  As directed    Call MD for:  severe uncontrolled pain   Complete by:  As directed    Call MD for:  temperature >100.4   Complete by:  As directed    Diet - low sodium heart healthy   Complete by:  As directed    Increase activity slowly   Complete by:  As directed      Allergies as of 11/23/2017   No Known Allergies     Medication List    TAKE these medications   amlodipine-olmesartan 10-20 MG tablet Commonly known as:  AZOR Take 1 tablet by mouth daily.   aspirin EC 81 MG tablet Take 81 mg by mouth daily.   clotrimazole-betamethasone cream Commonly known as:  LOTRISONE Apply 1 application topically as needed (between toes).   finasteride 5 MG tablet Commonly known as:  PROSCAR Take 5 mg by mouth daily.   levothyroxine 112 MCG tablet Commonly known as:  SYNTHROID, LEVOTHROID Take 112 mcg by mouth daily.   multivitamin with minerals tablet Take 1 tablet by mouth daily.   tamsulosin 0.4 MG Caps capsule Commonly known as:  FLOMAX Take 0.4 mg by mouth at bedtime.      Follow-up Information    Adrian Prows, MD. Go on 12/09/2017.   Specialty:  Cardiology Why:  To be seen in 2 weeks @1 :30pm Contact information: [place] [id number] [phone]        Iona Beard, MD Follow up.   Specialty:  Family Medicine Contact information: [address] [place] [address] [phone]          No Known Allergies  Consultations: DR  Einar Gip   Procedures/Studies: Ct Head Wo Contrast  Result Date: 11/22/2017 CLINICAL DATA:  Syncopal episode, fall.  Neck pain. EXAM: CT HEAD WITHOUT CONTRAST CT CERVICAL SPINE WITHOUT CONTRAST TECHNIQUE: Multidetector CT imaging of the head and cervical spine was performed following the standard protocol without intravenous contrast. Multiplanar CT image reconstructions of the cervical spine were also generated. COMPARISON:  None. FINDINGS: CT HEAD FINDINGS Brain: Ventricles are within normal limits in size and configuration. Patchy areas of chronic small vessel ischemic change noted within the bilateral periventricular and subcortical white matter regions. There is no mass, hemorrhage, edema or other evidence of acute parenchymal abnormality. No extra-axial hemorrhage. Vascular: There are chronic calcified atherosclerotic changes of the large vessels at the skull base. No unexpected hyperdense vessel. Skull: Normal. Negative for fracture or focal lesion. Sinuses/Orbits: No acute finding. Other: Soft tissue edema/hematoma overlying the LEFT orbit and LEFT lower frontal bone. No underlying fracture. CT CERVICAL SPINE FINDINGS Alignment: Straightening of the normal cervical spine lordosis is likely related to patient positioning or muscle spasm. No evidence of acute vertebral body subluxation. Skull base and vertebrae: No fracture line  or displaced fracture fragment seen. Facet joints appear intact and normally aligned throughout. Soft tissues and spinal canal: No prevertebral fluid or swelling. No visible canal hematoma. Disc levels: Mild disc desiccations within the mid and lower cervical spine, but no more than mild central canal stenosis at any level. Upper chest: No acute findings. Other: Bilateral carotid atherosclerosis. IMPRESSION: 1. Soft tissue edema/hematoma overlying the LEFT orbit and LEFT lower frontal bone. No underlying fracture. 2. No acute intracranial abnormality. No intracranial mass, hemorrhage  or edema. No skull fracture. Mild chronic small vessel ischemic changes within the white matter. 3. No fracture or acute subluxation within the cervical spine. Straightening of the normal cervical spine lordosis is likely related to patient positioning or muscle spasm. Mild degenerative change within the mid and lower cervical spine. Electronically Signed   By: Franki Cabot M.D.   On: 11/22/2017 17:37   Ct Cervical Spine Wo Contrast  Result Date: 11/22/2017 CLINICAL DATA:  Syncopal episode, fall.  Neck pain. EXAM: CT HEAD WITHOUT CONTRAST CT CERVICAL SPINE WITHOUT CONTRAST TECHNIQUE: Multidetector CT imaging of the head and cervical spine was performed following the standard protocol without intravenous contrast. Multiplanar CT image reconstructions of the cervical spine were also generated. COMPARISON:  None. FINDINGS: CT HEAD FINDINGS Brain: Ventricles are within normal limits in size and configuration. Patchy areas of chronic small vessel ischemic change noted within the bilateral periventricular and subcortical white matter regions. There is no mass, hemorrhage, edema or other evidence of acute parenchymal abnormality. No extra-axial hemorrhage. Vascular: There are chronic calcified atherosclerotic changes of the large vessels at the skull base. No unexpected hyperdense vessel. Skull: Normal. Negative for fracture or focal lesion. Sinuses/Orbits: No acute finding. Other: Soft tissue edema/hematoma overlying the LEFT orbit and LEFT lower frontal bone. No underlying fracture. CT CERVICAL SPINE FINDINGS Alignment: Straightening of the normal cervical spine lordosis is likely related to patient positioning or muscle spasm. No evidence of acute vertebral body subluxation. Skull base and vertebrae: No fracture line or displaced fracture fragment seen. Facet joints appear intact and normally aligned throughout. Soft tissues and spinal canal: No prevertebral fluid or swelling. No visible canal hematoma. Disc levels:  Mild disc desiccations within the mid and lower cervical spine, but no more than mild central canal stenosis at any level. Upper chest: No acute findings. Other: Bilateral carotid atherosclerosis. IMPRESSION: 1. Soft tissue edema/hematoma overlying the LEFT orbit and LEFT lower frontal bone. No underlying fracture. 2. No acute intracranial abnormality. No intracranial mass, hemorrhage or edema. No skull fracture. Mild chronic small vessel ischemic changes within the white matter. 3. No fracture or acute subluxation within the cervical spine. Straightening of the normal cervical spine lordosis is likely related to patient positioning or muscle spasm. Mild degenerative change within the mid and lower cervical spine. Electronically Signed   By: Franki Cabot M.D.   On: 11/22/2017 17:37    (Echo, Carotid, EGD, Colonoscopy, ERCP)    Subjective:   Discharge Exam: Vitals:   11/23/17 0156 11/23/17 0417  BP: (!) 161/71 (!) 149/77  Pulse: 75 72  Resp:  18  Temp:  97.8 F (36.6 C)  SpO2:  99%   Vitals:   11/22/17 2236 11/23/17 0005 11/23/17 0156 11/23/17 0417  BP: (!) 153/70 (!) 185/78 (!) 161/71 (!) 149/77  Pulse: 89 81 75 72  Resp:  18  18  Temp:  (!) 97.5 F (36.4 C)  97.8 F (36.6 C)  TempSrc:  Oral  Oral  SpO2:  100%  99%  Weight:    96.3 kg (212 lb 4.8 oz)  Height:        General: Pt is alert, awake, not in acute distress Cardiovascular: RRR, S1/S2 +, no rubs, no gallops Respiratory: CTA bilaterally, no wheezing, no rhonchi Abdominal: Soft, NT, ND, bowel sounds + Extremities: no edema, no cyanosis    The results of significant diagnostics from this hospitalization (including imaging, microbiology, ancillary and laboratory) are listed below for reference.     Microbiology: No results found for this or any previous visit (from the past 240 hour(s)).   Labs: BNP (last 3 results) No results for input(s): BNP in the last 8760 hours. Basic Metabolic Panel: Recent Labs  Lab  11/22/17 1547 11/23/17 0519  NA 139 142  K 4.7 4.8  CL 103 107  CO2 27 29  GLUCOSE 138* 167*  BUN 12 12  CREATININE 1.55* 1.23  CALCIUM 9.6 9.3   Liver Function Tests: No results for input(s): AST, ALT, ALKPHOS, BILITOT, PROT, ALBUMIN in the last 168 hours. No results for input(s): LIPASE, AMYLASE in the last 168 hours. No results for input(s): AMMONIA in the last 168 hours. CBC: Recent Labs  Lab 11/22/17 1547 11/23/17 0519  WBC 11.4* 9.1  HGB 13.6 13.6  HCT 42.0 42.5  MCV 94.2 94.7  PLT 220 225   Cardiac Enzymes: No results for input(s): CKTOTAL, CKMB, CKMBINDEX, TROPONINI in the last 168 hours. BNP: Invalid input(s): POCBNP CBG: Recent Labs  Lab 11/22/17 1519  GLUCAP 111*   D-Dimer No results for input(s): DDIMER in the last 72 hours. Hgb A1c Recent Labs    11/23/17 1257  HGBA1C 7.0*   Lipid Profile Recent Labs    11/23/17 0519  CHOL 133  HDL 29*  LDLCALC 48  TRIG 282*  CHOLHDL 4.6   Thyroid function studies Recent Labs    11/23/17 0519  TSH 1.343   Anemia work up No results for input(s): VITAMINB12, FOLATE, FERRITIN, TIBC, IRON, RETICCTPCT in the last 72 hours. Urinalysis    Component Value Date/Time   COLORURINE COLORLESS (A) 11/22/2017 1832   APPEARANCEUR CLEAR 11/22/2017 1832   LABSPEC 1.003 (L) 11/22/2017 1832   PHURINE 7.0 11/22/2017 1832   GLUCOSEU NEGATIVE 11/22/2017 1832   HGBUR MODERATE (A) 11/22/2017 1832   BILIRUBINUR NEGATIVE 11/22/2017 1832   KETONESUR NEGATIVE 11/22/2017 1832   PROTEINUR NEGATIVE 11/22/2017 1832   NITRITE NEGATIVE 11/22/2017 1832   LEUKOCYTESUR NEGATIVE 11/22/2017 1832   Sepsis Labs Invalid input(s): PROCALCITONIN,  WBC,  LACTICIDVEN Microbiology No results found for this or any previous visit (from the past 240 hour(s)).   Time coordinating discharge: 34 minutes  SIGNED:   Georgette Shell, MD  [facility] 11/24/2017, 12:42 PM Pager   If 7PM-7AM, please contact  night-coverage [email or web address] Password [email or web address]

## 2017-11-30 DIAGNOSIS — M1830 Unilateral post-traumatic osteoarthritis of first carpometacarpal joint, unspecified hand: Secondary | ICD-10-CM | POA: Diagnosis not present

## 2017-11-30 DIAGNOSIS — Z6820 Body mass index (BMI) 20.0-20.9, adult: Secondary | ICD-10-CM | POA: Diagnosis not present

## 2017-11-30 DIAGNOSIS — B355 Tinea imbricata: Secondary | ICD-10-CM | POA: Diagnosis not present

## 2017-11-30 DIAGNOSIS — B353 Tinea pedis: Secondary | ICD-10-CM | POA: Diagnosis not present

## 2017-11-30 DIAGNOSIS — R739 Hyperglycemia, unspecified: Secondary | ICD-10-CM | POA: Diagnosis not present

## 2017-11-30 DIAGNOSIS — I10 Essential (primary) hypertension: Secondary | ICD-10-CM | POA: Diagnosis not present

## 2017-11-30 DIAGNOSIS — S0012XA Contusion of left eyelid and periocular area, initial encounter: Secondary | ICD-10-CM | POA: Diagnosis not present

## 2017-12-07 DIAGNOSIS — R972 Elevated prostate specific antigen [PSA]: Secondary | ICD-10-CM | POA: Diagnosis not present

## 2017-12-09 DIAGNOSIS — R55 Syncope and collapse: Secondary | ICD-10-CM | POA: Diagnosis not present

## 2017-12-09 DIAGNOSIS — I10 Essential (primary) hypertension: Secondary | ICD-10-CM | POA: Diagnosis not present

## 2017-12-09 DIAGNOSIS — E119 Type 2 diabetes mellitus without complications: Secondary | ICD-10-CM | POA: Diagnosis not present

## 2017-12-09 DIAGNOSIS — Z0189 Encounter for other specified special examinations: Secondary | ICD-10-CM | POA: Diagnosis not present

## 2017-12-18 DIAGNOSIS — I10 Essential (primary) hypertension: Secondary | ICD-10-CM | POA: Diagnosis not present

## 2017-12-18 DIAGNOSIS — E119 Type 2 diabetes mellitus without complications: Secondary | ICD-10-CM | POA: Diagnosis not present

## 2017-12-18 DIAGNOSIS — R55 Syncope and collapse: Secondary | ICD-10-CM | POA: Diagnosis not present

## 2017-12-22 DIAGNOSIS — R739 Hyperglycemia, unspecified: Secondary | ICD-10-CM | POA: Diagnosis not present

## 2017-12-22 DIAGNOSIS — B353 Tinea pedis: Secondary | ICD-10-CM | POA: Diagnosis not present

## 2017-12-22 DIAGNOSIS — S0012XD Contusion of left eyelid and periocular area, subsequent encounter: Secondary | ICD-10-CM | POA: Diagnosis not present

## 2018-01-21 DIAGNOSIS — R55 Syncope and collapse: Secondary | ICD-10-CM | POA: Diagnosis not present

## 2018-01-21 DIAGNOSIS — E119 Type 2 diabetes mellitus without complications: Secondary | ICD-10-CM | POA: Diagnosis not present

## 2018-01-21 DIAGNOSIS — I10 Essential (primary) hypertension: Secondary | ICD-10-CM | POA: Diagnosis not present

## 2018-01-21 DIAGNOSIS — Z0189 Encounter for other specified special examinations: Secondary | ICD-10-CM | POA: Diagnosis not present

## 2018-02-09 DIAGNOSIS — B353 Tinea pedis: Secondary | ICD-10-CM | POA: Diagnosis not present

## 2018-02-09 DIAGNOSIS — I10 Essential (primary) hypertension: Secondary | ICD-10-CM | POA: Diagnosis not present

## 2018-02-09 DIAGNOSIS — R739 Hyperglycemia, unspecified: Secondary | ICD-10-CM | POA: Diagnosis not present

## 2018-02-09 DIAGNOSIS — H6122 Impacted cerumen, left ear: Secondary | ICD-10-CM | POA: Diagnosis not present

## 2018-02-16 DIAGNOSIS — Z961 Presence of intraocular lens: Secondary | ICD-10-CM | POA: Diagnosis not present

## 2018-02-16 DIAGNOSIS — H35371 Puckering of macula, right eye: Secondary | ICD-10-CM | POA: Diagnosis not present

## 2018-02-16 DIAGNOSIS — H6123 Impacted cerumen, bilateral: Secondary | ICD-10-CM | POA: Diagnosis not present

## 2018-03-11 DIAGNOSIS — R3915 Urgency of urination: Secondary | ICD-10-CM | POA: Diagnosis not present

## 2018-03-11 DIAGNOSIS — R3121 Asymptomatic microscopic hematuria: Secondary | ICD-10-CM | POA: Diagnosis not present

## 2018-05-10 DIAGNOSIS — R7309 Other abnormal glucose: Secondary | ICD-10-CM | POA: Diagnosis not present

## 2018-05-10 DIAGNOSIS — R799 Abnormal finding of blood chemistry, unspecified: Secondary | ICD-10-CM | POA: Diagnosis not present

## 2018-05-10 DIAGNOSIS — B353 Tinea pedis: Secondary | ICD-10-CM | POA: Diagnosis not present

## 2018-05-10 DIAGNOSIS — I10 Essential (primary) hypertension: Secondary | ICD-10-CM | POA: Diagnosis not present

## 2018-05-10 DIAGNOSIS — E039 Hypothyroidism, unspecified: Secondary | ICD-10-CM | POA: Diagnosis not present

## 2018-05-10 DIAGNOSIS — R7303 Prediabetes: Secondary | ICD-10-CM | POA: Diagnosis not present

## 2018-06-01 ENCOUNTER — Encounter (HOSPITAL_COMMUNITY): Payer: Medicare Other

## 2018-06-01 ENCOUNTER — Ambulatory Visit: Payer: Medicare Other | Admitting: Family

## 2018-06-01 ENCOUNTER — Encounter: Payer: Self-pay | Admitting: Family

## 2018-06-18 ENCOUNTER — Other Ambulatory Visit: Payer: Self-pay

## 2018-06-18 ENCOUNTER — Encounter: Payer: Self-pay | Admitting: Family

## 2018-06-18 ENCOUNTER — Ambulatory Visit (INDEPENDENT_AMBULATORY_CARE_PROVIDER_SITE_OTHER): Payer: Medicare Other | Admitting: Physician Assistant

## 2018-06-18 ENCOUNTER — Ambulatory Visit (HOSPITAL_COMMUNITY)
Admission: RE | Admit: 2018-06-18 | Discharge: 2018-06-18 | Disposition: A | Discharge status: Home / Self Care | Payer: Medicare Other | Source: Ambulatory Visit | Attending: Family | Admitting: Family

## 2018-06-18 VITALS — BP 143/64 | HR 61 | Temp 96.7°F | Resp 18 | Ht 72.0 in | Wt 193.0 lb

## 2018-06-18 DIAGNOSIS — I771 Stricture of artery: Secondary | ICD-10-CM

## 2018-06-18 DIAGNOSIS — K922 Gastrointestinal hemorrhage, unspecified: Secondary | ICD-10-CM | POA: Diagnosis not present

## 2018-06-18 DIAGNOSIS — I774 Celiac artery compression syndrome: Secondary | ICD-10-CM

## 2018-06-18 DIAGNOSIS — K551 Chronic vascular disorders of intestine: Secondary | ICD-10-CM | POA: Diagnosis not present

## 2018-06-18 NOTE — Progress Notes (Signed)
Established Mesenteric Ischemia   History of Present Illness   Adrian Ellis is a 78 y.o. (04/29/1941) male who returns to clinic to go over mesenteric artery duplex.  He was admitted to the hospital and treated for a GI bleed in October 2018.  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  Since then, he has had several mesenteric duplex scans which have all been stable.  The patient denies any regular recurrence of abdominal pain.  The patient denies food fear.  The patient has had some weight loss over the past year however admits that this was intentional as he has been changing his diet.  He is able to take aspirin and is taking 81 mg daily.  He is not on a statin.  He also denies any changes in bowel habits.   The patient's PMH, PSH, SH, and FamHx were reviewed and are unchanged from prior visit.  Current Outpatient Medications  Medication Sig Dispense Refill  . amlodipine-olmesartan (AZOR) 10-20 MG tablet Take 1 tablet by mouth daily.  1  . aspirin 81 MG chewable tablet Chew 81 mg by mouth daily.    Marland Kitchen aspirin EC 81 MG tablet Take 81 mg by mouth daily.    . clotrimazole-betamethasone (LOTRISONE) cream APPLY BETWEEN TOES TWICE A DAY  1  . clotrimazole-betamethasone (LOTRISONE) cream Apply 1 application topically as needed (between toes).   1  . finasteride (PROSCAR) 5 MG tablet Take 5 mg by mouth daily.    . finasteride (PROSCAR) 5 MG tablet Take 5 mg by mouth daily.  11  . FLUAD 0.5 ML SUSY ADM 0.5ML IM UTD    . levothyroxine (SYNTHROID, LEVOTHROID) 112 MCG tablet Take 112 mcg by mouth daily before breakfast.    . levothyroxine (SYNTHROID, LEVOTHROID) 112 MCG tablet Take 112 mcg by mouth daily.    . Multiple Vitamin (MULTIVITAMIN WITH MINERALS) TABS Take 1 tablet by mouth daily.    . Multiple Vitamins-Minerals (MULTIVITAMIN WITH MINERALS) tablet Take 1 tablet by mouth daily.    . tamsulosin (FLOMAX) 0.4 MG CAPS capsule Take  0.4 mg by mouth daily.    . tamsulosin (FLOMAX) 0.4 MG CAPS capsule Take 0.4 mg by mouth at bedtime.  11   No current facility-administered medications for this visit.     On ROS today: 10 system ROS is negative unless otherwise noted in HPI   Physical Examination   Vitals:   06/18/18 0952  BP: (!) 143/64  Pulse: 61  Resp: 18  Temp: (!) 96.7 F (35.9 C)  TempSrc: Oral  SpO2: 100%  Weight: 193 lb (87.5 kg)  Height: 6' (1.829 m)   Body mass index is 26.18 kg/m.  General Alert, O x 3, WD, NAD  Pulmonary Sym exp, good B air movt  Cardiac RRR, Nl S1, S2,   Vascular Vessel Right Left  Radial Palpable Palpable  Brachial Palpable Palpable  Aorta Not palpable N/A  Femoral Palpable Palpable  Popliteal Not palpable Not palpable  PT Not palpable Not palpable  DP Not palpable Not palpable    Gastro- intestinal soft, non-distended, non-tender to palpation, No guarding or rebound,   Musculo- skeletal M/S 5/5 throughout  , Extremities without ischemic changes  , No edema present, No visible varicosities , No Lipodermatosclerosis present  Neurologic A&O    Non-Invasive Vascular Imaging   Mesenteric Duplex (06/18/18):   Duplex Findings: +--------------------+--------+--------+---------+--------+ Mesenteric  PSV cm/sEDV cm/s Plaque  Comments +--------------------+--------+--------+---------+--------+ Aorta Prox            103                             +--------------------+--------+--------+---------+--------+ Celiac Artery Origin  218           irregular         +--------------------+--------+--------+---------+--------+ SMA Origin            190           irregular         +--------------------+--------+--------+---------+--------+ SMA Proximal          204                             +--------------------+--------+--------+---------+--------+ SMA Mid               162                              +--------------------+--------+--------+---------+--------+ CHA                   174                             +--------------------+--------+--------+---------+--------+ Splenic               177                             +--------------------+--------+--------+---------+--------+ IMA                   115                               Medical Decision Making   Eugine Bubb is a 78 y.o. 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

## 2018-08-09 DIAGNOSIS — R7303 Prediabetes: Secondary | ICD-10-CM | POA: Diagnosis not present

## 2018-08-09 DIAGNOSIS — I10 Essential (primary) hypertension: Secondary | ICD-10-CM | POA: Diagnosis not present

## 2018-08-09 DIAGNOSIS — E039 Hypothyroidism, unspecified: Secondary | ICD-10-CM | POA: Diagnosis not present

## 2018-08-31 IMAGING — CT CT CTA ABD/PEL W/CM AND/OR W/O CM
3 of 12 series · 12 of 46 positions shown, 17 images · IV contrast (APPLIED)
Comparison: 09/27/2007

CLINICAL DATA: GI bleed

EXAM:
CTA ABDOMEN AND PELVIS wITHOUT AND WITH CONTRAST
TECHNIQUE: Multidetector CT imaging of the abdomen and pelvis was performed
using the standard protocol during bolus administration of
intravenous contrast. Multiplanar reconstructed images and MIPs were
obtained and reviewed to evaluate the vascular anatomy.
CONTRAST:  100 mL Isovue 370 intravenous

[Series 8: cor · coronal · 0.75mm/px · 2 of 153 slices shown, 3 images]
[im 51/153  soft-tissue]
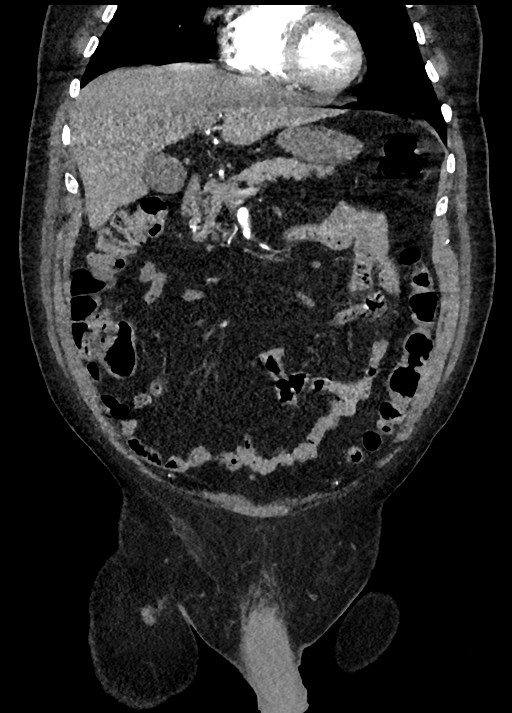
[im 51/153  bone]
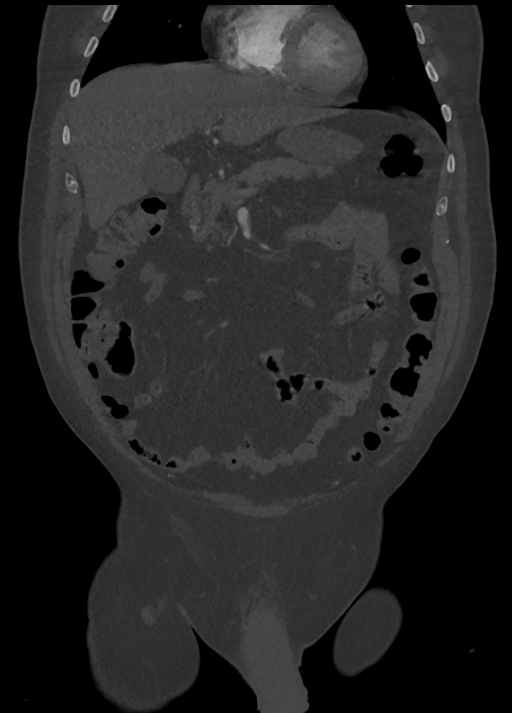
[im 102/153  soft-tissue]
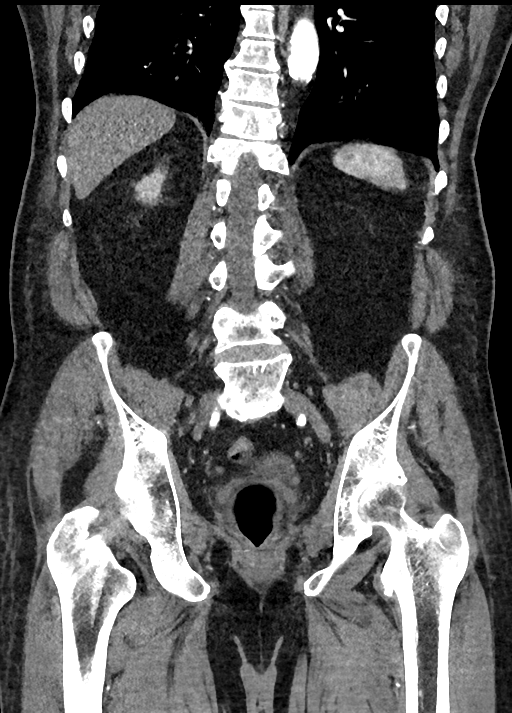

[Series 12: venous · axial · portal-venous · 0.82mm/px · z∈[+906,+1438]mm · 3 of 267 slices shown, 7 images]
[im 1/267  soft-tissue]
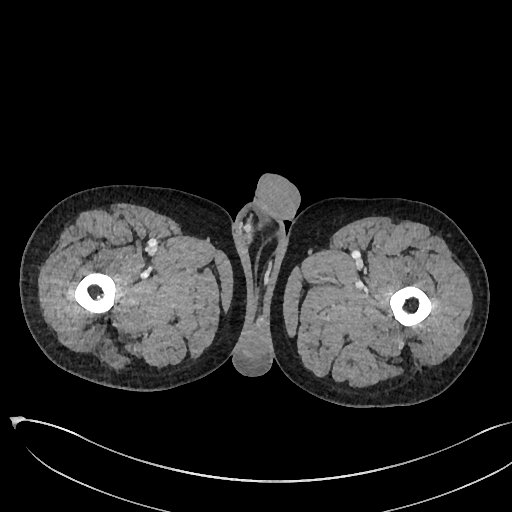
[im 1/267  lung]
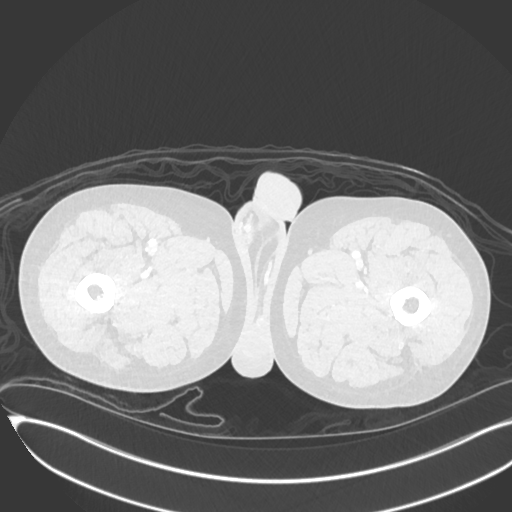
[im 1/267  bone]
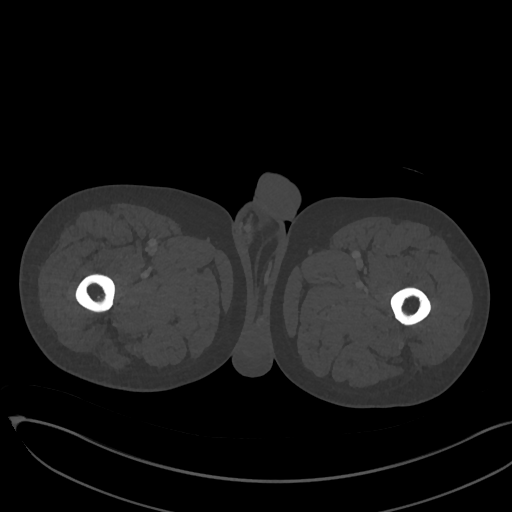
[im 134/267  soft-tissue]
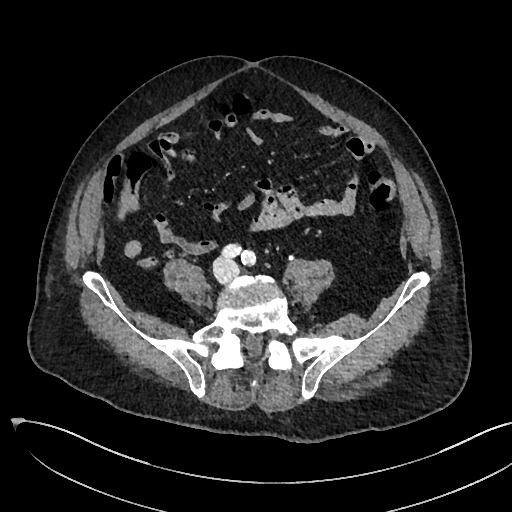
[im 134/267  lung]
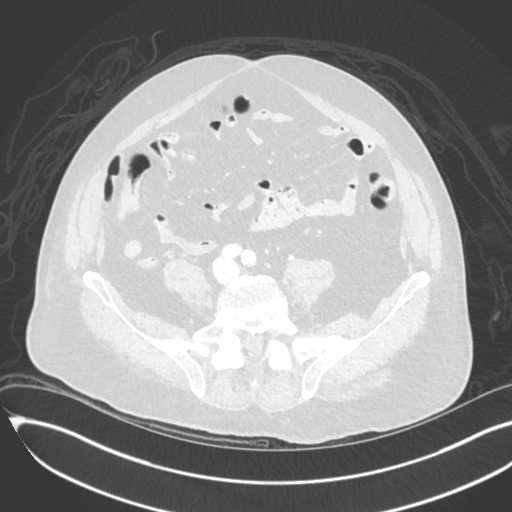
[im 267/267  soft-tissue]
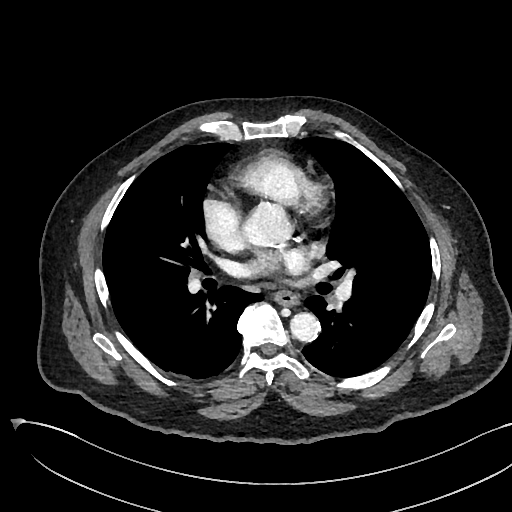
[im 267/267  lung]
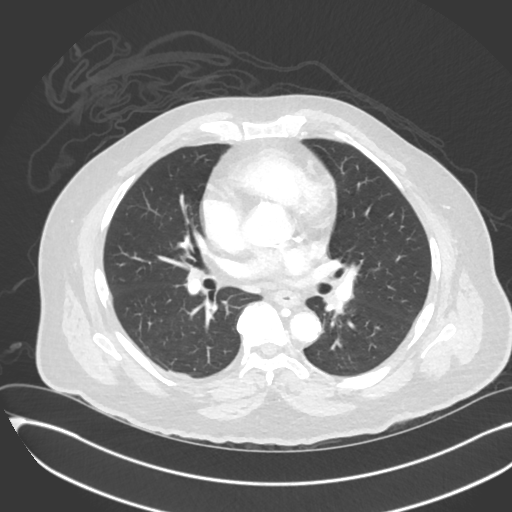

[Series 13: venous thins · axial · portal-venous · 0.82mm/px · z∈[+950,+1305]mm · 7 of 1333 slices shown]
[im 112/1333  soft-tissue]
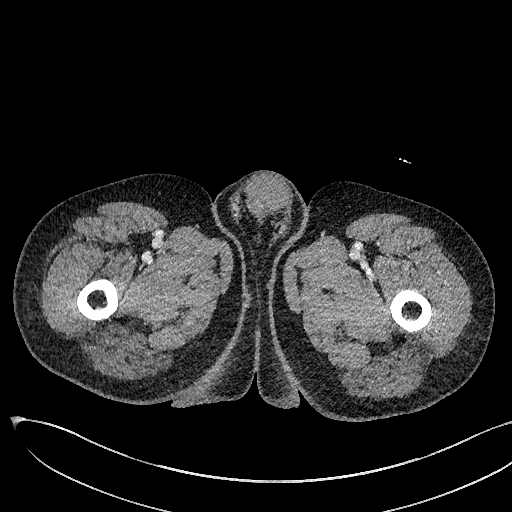
[im 334/1333  soft-tissue]
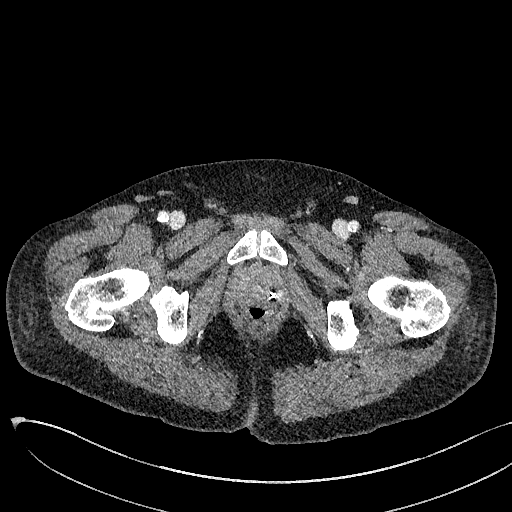
[im 445/1333  soft-tissue]
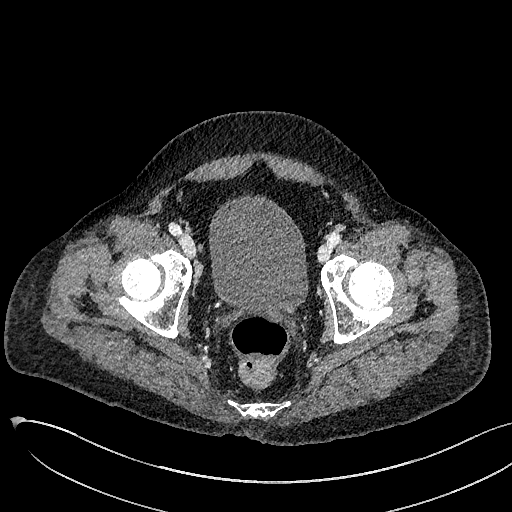
[im 556/1333  soft-tissue]
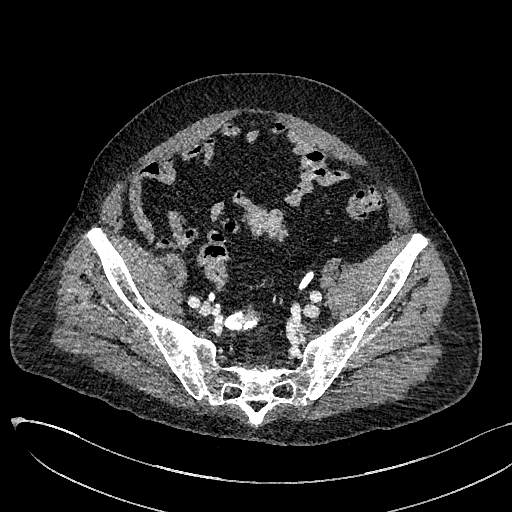
[im 778/1333  soft-tissue]
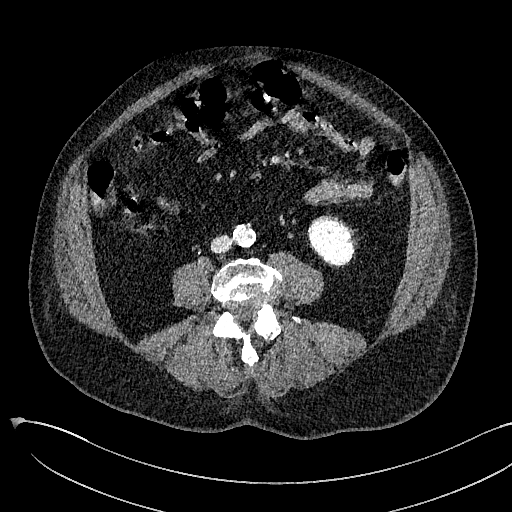
[im 889/1333  soft-tissue]
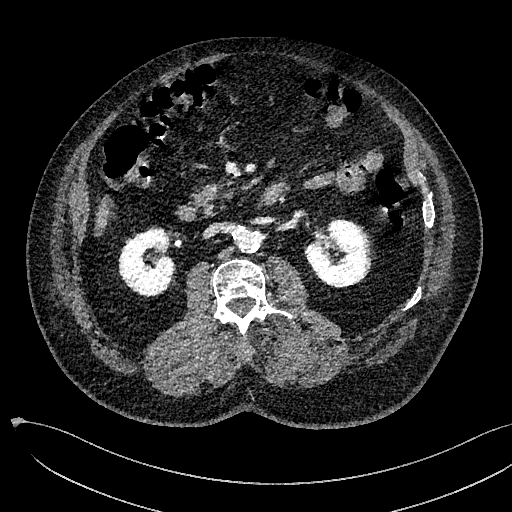
[im 1000/1333  soft-tissue]
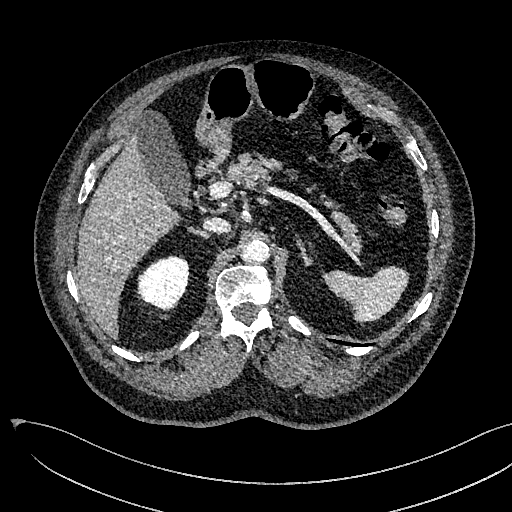

[12 of 46 positions shown; findings below may reference images not displayed]

FINDINGS: VASCULAR

Aorta: The abdominal aorta is normal in caliber. There is extensive
atherosclerotic calcification. No dissection, stenosis or occlusion.

Celiac: The celiac axis is heavily stenotic at its origin, over 70%
diameter reduction with mild poststenotic aneurysmal dilatation. No
dissection or occlusion.

SMA: The SMA is heavily calcified at its origin with about 60%
diameter reduction stenosis. Remainder is patent. No dissection.

Renals: Both renal arteries are heavily calcified at their origins,
without significant stenosis. No dissection or occlusion.

IMA: The IMA is patent and normal in caliber.

Inflow: Mild-to-moderate atherosclerotic calcifications. Normal
caliber. No dissection or occlusion.

Proximal Outflow: Common femoral arteries and femoral bifurcations
exhibit extensive atherosclerotic calcification but no aneurysm,
dissection or occlusion. Proximal portions of the superficial and
profunda femoral arteries are widely patent.

Veins: No obvious venous abnormality within the limitations of this
arterial phase study.

Review of the MIP images confirms the above findings.

NON-VASCULAR

Lower chest: No acute abnormality.

Hepatobiliary: No focal liver abnormality is seen. No gallstones,
gallbladder wall thickening, or biliary dilatation.

Pancreas: Unremarkable. No pancreatic ductal dilatation or
surrounding inflammatory changes.

Spleen: Normal in size without focal abnormality.

Adrenals/Urinary Tract: Adrenal glands are unremarkable. Kidneys are
normal, without renal calculi, focal lesion, or hydronephrosis.
Bladder is unremarkable.

Stomach/Bowel: Hiatal hernia. Stomach and small bowel are otherwise
normal. Appendix is normal. Colon is unremarkable. No intrinsic
bowel lesion. No inflammation or obstruction of bowel. No CT
evidence of bowel ischemia.

Lymphatic: No pathologic adenopathy in the abdomen or pelvis.

Reproductive: Unremarkable

Other: No focal inflammation.  No ascites.

Musculoskeletal: No significant skeletal lesion.
IMPRESSION: VASCULAR

1. Extensive atherosclerosis of the aorta and its major branches. No
aneurysm or occlusion. No dissection.
2. High-grade stenosis at the origin of the celiac axis. Moderate
stenosis at the origin of the SMA. IMA is patent.

NON-VASCULAR

No acute findings in the abdomen or pelvis. Small hiatal hernia. No
CT evidence of bowel ischemia, inflammation or obstruction.

## 2018-11-08 DIAGNOSIS — R7303 Prediabetes: Secondary | ICD-10-CM | POA: Diagnosis not present

## 2018-11-08 DIAGNOSIS — B353 Tinea pedis: Secondary | ICD-10-CM | POA: Diagnosis not present

## 2018-11-08 DIAGNOSIS — E785 Hyperlipidemia, unspecified: Secondary | ICD-10-CM | POA: Diagnosis not present

## 2018-11-08 DIAGNOSIS — E039 Hypothyroidism, unspecified: Secondary | ICD-10-CM | POA: Diagnosis not present

## 2018-11-08 DIAGNOSIS — Z0000 Encounter for general adult medical examination without abnormal findings: Secondary | ICD-10-CM | POA: Diagnosis not present

## 2018-11-08 DIAGNOSIS — I10 Essential (primary) hypertension: Secondary | ICD-10-CM | POA: Diagnosis not present

## 2019-02-22 DIAGNOSIS — H35371 Puckering of macula, right eye: Secondary | ICD-10-CM | POA: Diagnosis not present

## 2019-02-22 DIAGNOSIS — Z961 Presence of intraocular lens: Secondary | ICD-10-CM | POA: Diagnosis not present

## 2019-03-14 DIAGNOSIS — R3121 Asymptomatic microscopic hematuria: Secondary | ICD-10-CM | POA: Diagnosis not present

## 2019-03-15 DIAGNOSIS — Z23 Encounter for immunization: Secondary | ICD-10-CM | POA: Diagnosis not present

## 2019-03-15 DIAGNOSIS — I10 Essential (primary) hypertension: Secondary | ICD-10-CM | POA: Diagnosis not present

## 2019-03-15 DIAGNOSIS — E785 Hyperlipidemia, unspecified: Secondary | ICD-10-CM | POA: Diagnosis not present

## 2019-03-15 DIAGNOSIS — R7303 Prediabetes: Secondary | ICD-10-CM | POA: Diagnosis not present

## 2019-03-15 DIAGNOSIS — E039 Hypothyroidism, unspecified: Secondary | ICD-10-CM | POA: Diagnosis not present

## 2019-03-15 DIAGNOSIS — B353 Tinea pedis: Secondary | ICD-10-CM | POA: Diagnosis not present

## 2019-04-26 DIAGNOSIS — I10 Essential (primary) hypertension: Secondary | ICD-10-CM | POA: Diagnosis not present

## 2019-04-26 DIAGNOSIS — E039 Hypothyroidism, unspecified: Secondary | ICD-10-CM | POA: Diagnosis not present

## 2019-04-26 DIAGNOSIS — B352 Tinea manuum: Secondary | ICD-10-CM | POA: Diagnosis not present

## 2019-04-26 DIAGNOSIS — R7303 Prediabetes: Secondary | ICD-10-CM | POA: Diagnosis not present

## 2019-06-01 ENCOUNTER — Telehealth: Payer: Self-pay | Admitting: *Deleted

## 2019-06-01 NOTE — Telephone Encounter (Signed)
Virtual Visit Pre-Appointment Phone Call  Today, I spoke with Adrian Ellis and performed the following actions:  1. I explained that we are currently trying to limit exposure to the COVID-19 virus by seeing patients at home rather than in the office.  I explained that the visits are best done by video, but can be done by telephone.  I asked the patient if a virtual visit that the patient would like to try instead of coming into the office. Adrian Ellis agreed to proceed with the virtual visit scheduled with PA on 07/04/19 .     2. I confirmed the BEST phone number to call the day of the visit and- I included this in appointment notes.  3. I asked if the patient had access to (through a family member/friend) a smartphone with video capability to be used for his visit?"  The patient said yes -   4. I confirmed consent by  a. sending through [place] or by email the [place] as written at the end of this message or  b. verbally as listed below. i. This visit is being performed in the setting of COVID-19. ii. All virtual visits are billed to your insurance company just like a normal visit would be.   iii. We'd like you to understand that the technology does not allow for your provider to perform an examination, and thus may limit your provider's ability to fully assess your condition.  iv. If your provider identifies any concerns that need to be evaluated in person, we will make arrangements to do so.   v. Finally, though the technology is pretty good, we cannot assure that it will always work on either your or our end, and in the setting of a video visit, we may have to convert it to a phone-only visit.  In either situation, we cannot ensure that we have a secure connection.   vi. Are you willing to proceed?"  STAFF: Did the patient verbally acknowledge consent to telehealth visit? Document YES/NO here: YES  2. I advised the patient to be prepared - I  asked that the patient, on the day of his visit, record any information possible with the equipment at his home, such as blood pressure, pulse, oxygen saturation, and your weight and write them all down. I asked the patient to have a pen and paper handy nearby the day of the visit as well.  3. If the patient was scheduled for a video visit, I informed the patient that the visit with the doctor would start with a text to the smartphone # given to [place] by the patient.         If the patient was scheduled for a telephone call, I informed the patient that the visit with the doctor would start with a call to the telephone # given to [place] by the patient.  4. I Informed patient they will receive a phone call 15 minutes prior to their appointment time from a [place] or nurse to review medications, allergies, etc. to prepare for the visit.    TELEPHONE CALL NOTE  Adrian Ellis has been deemed a candidate for a follow-up tele-health visit to limit community exposure during the Covid-19 pandemic. I spoke with the patient via phone to ensure availability of phone/video source, confirm preferred email & phone number, and discuss instructions and expectations.  I reminded Adrian Ellis to be prepared with any vital sign and/or heart rhythm information that could  potentially be obtained via home monitoring, at the time of his visit. I reminded Adrian Ellis to expect a phone call prior to his visit.  Adrian Ellis, NT 06/01/2019 10:12 AM     FULL LENGTH CONSENT FOR TELE-HEALTH VISIT   I hereby voluntarily request, consent and authorize [facility] and its employed or contracted physicians, physician assistants, nurse practitioners or other licensed health care professionals (the Practitioner), to provide me with telemedicine health care services (the "Services") as deemed necessary by the treating Practitioner. I acknowledge and consent to receive the Services by the Practitioner via telemedicine. I  understand that the telemedicine visit will involve communicating with the Practitioner through live audiovisual communication technology and the disclosure of certain medical information by electronic transmission. I acknowledge that I have been given the opportunity to request an in-person assessment or other available alternative prior to the telemedicine visit and am voluntarily participating in the telemedicine visit.  I understand that I have the right to withhold or withdraw my consent to the use of telemedicine in the course of my care at any time, without affecting my right to future care or treatment, and that the Practitioner or I may terminate the telemedicine visit at any time. I understand that I have the right to inspect all information obtained and/or recorded in the course of the telemedicine visit and may receive copies of available information for a reasonable fee.  I understand that some of the potential risks of receiving the Services via telemedicine include:  Marland Kitchen Delay or interruption in medical evaluation due to technological equipment failure or disruption; . Information transmitted may not be sufficient (e.g. poor resolution of images) to allow for appropriate medical decision making by the Practitioner; and/or  . In rare instances, security protocols could fail, causing a breach of personal health information.  Furthermore, I acknowledge that it is my responsibility to provide information about my medical history, conditions and care that is complete and accurate to the best of my ability. I acknowledge that Practitioner's advice, recommendations, and/or decision may be based on factors not within their control, such as incomplete or inaccurate data provided by me or distortions of diagnostic images or specimens that may result from electronic transmissions. I understand that the practice of medicine is not an exact science and that Practitioner makes no warranties or guarantees  regarding treatment outcomes. I acknowledge that I will receive a copy of this consent concurrently upon execution via email to the email address I last provided but may also request a printed copy by calling the office of [place].    I understand that my insurance will be billed for this visit.   I have read or had this consent read to me. . I understand the contents of this consent, which adequately explains the benefits and risks of the Services being provided via telemedicine.  . I have been provided ample opportunity to ask questions regarding this consent and the Services and have had my questions answered to my satisfaction. . I give my informed consent for the services to be provided through the use of telemedicine in my medical care  By participating in this telemedicine visit I agree to the above.

## 2019-06-07 DIAGNOSIS — B353 Tinea pedis: Secondary | ICD-10-CM | POA: Diagnosis not present

## 2019-06-07 DIAGNOSIS — I10 Essential (primary) hypertension: Secondary | ICD-10-CM | POA: Diagnosis not present

## 2019-06-07 DIAGNOSIS — R7303 Prediabetes: Secondary | ICD-10-CM | POA: Diagnosis not present

## 2019-06-12 ENCOUNTER — Ambulatory Visit: Payer: Medicare Other | Attending: Internal Medicine

## 2019-06-12 DIAGNOSIS — Z23 Encounter for immunization: Secondary | ICD-10-CM

## 2019-06-13 NOTE — Progress Notes (Signed)
   Covid-19 Vaccination Clinic  Name:  Adrian Ellis    MRN: [id number] DOB: 07/14/1940  06/12/2019  [name] was observed post Covid-19 immunization for 15 minutes without incidence. He was provided with Vaccine Information Sheet and instruction to access the V-Safe system.   [name] was instructed to call 911 with any severe reactions post vaccine: Marland Kitchen Difficulty breathing  . Swelling of your face and throat  . A fast heartbeat  . A bad rash all over your body  . Dizziness and weakness    Immunizations Administered    Name Date Dose VIS Date Route   Moderna COVID-19 Vaccine 06/12/2019  2:00 PM 0.5 mL 04/19/2019 Intramuscular   Manufacturer: Levan Hurst   Lot: [id number]   [id number]      Documented on behalf of: K. Quentin Cornwall

## 2019-06-27 ENCOUNTER — Ambulatory Visit: Payer: Self-pay

## 2019-06-30 ENCOUNTER — Other Ambulatory Visit: Payer: Self-pay

## 2019-06-30 DIAGNOSIS — K551 Chronic vascular disorders of intestine: Secondary | ICD-10-CM

## 2019-07-01 ENCOUNTER — Ambulatory Visit (HOSPITAL_COMMUNITY)
Admission: RE | Admit: 2019-07-01 | Discharge: 2019-07-01 | Disposition: A | Discharge status: Home / Self Care | Payer: Medicare Other | Source: Ambulatory Visit | Attending: Vascular Surgery | Admitting: Vascular Surgery

## 2019-07-01 DIAGNOSIS — K551 Chronic vascular disorders of intestine: Secondary | ICD-10-CM | POA: Diagnosis present

## 2019-07-04 ENCOUNTER — Other Ambulatory Visit: Payer: Self-pay

## 2019-07-04 ENCOUNTER — Ambulatory Visit (INDEPENDENT_AMBULATORY_CARE_PROVIDER_SITE_OTHER): Payer: Medicare Other | Admitting: Physician Assistant

## 2019-07-04 VITALS — Ht 72.0 in | Wt 214.0 lb

## 2019-07-04 DIAGNOSIS — I774 Celiac artery compression syndrome: Secondary | ICD-10-CM

## 2019-07-04 DIAGNOSIS — I771 Stricture of artery: Secondary | ICD-10-CM

## 2019-07-04 NOTE — Progress Notes (Signed)
Virtual Visit via Telephone Note   I connected with Adrian Ellis on 07/04/2019 using the [email or web address] by telephone and verified that I was speaking with the correct person using two identifiers. Patient was located at home. I am located at [facility] office.   The limitations of evaluation and management by telemedicine and the availability of in person appointments have been previously discussed with the patient and are documented in the patients chart. The patient expressed understanding and consented to proceed.  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 male who was hospitalized in July 2019 due to a GI bleed.  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  Stenosis was considered to be asymptomatic and he did not exhibit any signs of mesenteric ischemia.  He has been followed by duplex since that time.  Today results of mesenteric duplex were reviewed over the phone.  He denies any postprandial pain, fear of food, unexplained weight loss, or drastic bowel habit changes.  He is taking 81 mg of aspirin daily.  He does not take a statin.  Past Medical History:  Diagnosis Date  . Arthritis    "right shoulder" (02/25/2017)  . CKD (chronic kidney disease), stage II   . GERD (gastroesophageal reflux disease)   . Hypertension   . Hypothyroidism   . Lower urinary tract symptoms (LUTS)   . Microhematuria    history of, work-up in 2009 negative  . Prostate cancer ([place]) 02/23/14   gleason 3+4=7, volume 25.98 cc  . Prostate enlargement   . Sinus problem   . Thyroid disease     Past Surgical History:  Procedure Laterality Date  . CATARACT EXTRACTION W/ INTRAOCULAR LENS  IMPLANT, BILATERAL Bilateral   . COLONOSCOPY  2004  . ESOPHAGOGASTRODUODENOSCOPY N/A 02/26/2017   Procedure: ESOPHAGOGASTRODUODENOSCOPY (EGD);  Surgeon: Carol Ada, MD;  Location: [place];  Service: Endoscopy;  Laterality: N/A;  . FLEXIBLE SIGMOIDOSCOPY N/A 03/09/2015   Procedure: FLEXIBLE SIGMOIDOSCOPY;  Surgeon: Carol Ada, MD;  Location: [facility];  Service: Endoscopy;  Laterality: N/A;  . FLEXIBLE SIGMOIDOSCOPY N/A 02/26/2017   Procedure: FLEXIBLE SIGMOIDOSCOPY;  Surgeon: Carol Ada, MD;  Location: [facility];  Service: Endoscopy;  Laterality: N/A;  . PROSTATE BIOPSY  02/23/2014   Gleason 7, volume 25.98 cc  . TONSILLECTOMY  1953    Current Meds  Medication Sig  . amlodipine-olmesartan (AZOR) 10-20 MG tablet Take 1 tablet by mouth daily.  Marland Kitchen aspirin 81 MG chewable tablet Chew 81 mg by mouth daily.  Marland Kitchen aspirin EC 81 MG tablet Take 81 mg by mouth daily.  . clotrimazole-betamethasone (LOTRISONE) cream APPLY BETWEEN TOES TWICE A DAY  . clotrimazole-betamethasone (LOTRISONE) cream Apply 1 application topically as needed (between toes).   . finasteride (PROSCAR) 5 MG tablet Take 5 mg by mouth daily.  . finasteride (PROSCAR) 5 MG tablet Take 5 mg by mouth daily.  Marland Kitchen FLUAD 0.5 ML SUSY ADM 0.5ML IM UTD  . levothyroxine (SYNTHROID, LEVOTHROID) 112 MCG tablet Take 112 mcg by mouth daily before breakfast.  . levothyroxine (SYNTHROID, LEVOTHROID) 112 MCG tablet Take 112 mcg by mouth daily.  . Multiple Vitamin (MULTIVITAMIN WITH MINERALS) TABS Take 1 tablet by mouth daily.  . Multiple Vitamins-Minerals (MULTIVITAMIN WITH MINERALS) tablet Take 1 tablet by mouth daily.  . tamsulosin (FLOMAX) 0.4 MG CAPS capsule Take 0.4 mg by mouth daily.  . tamsulosin (FLOMAX) 0.4 MG  CAPS capsule Take 0.4 mg by mouth at bedtime.    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 The patient was provided an opportunity to ask questions and all were answered. The patient agreed with the plan and demonstrated an understanding of the instructions.   The patient was advised to call back or seek an in-person evaluation if the symptoms worsen or if the condition fails to improve as anticipated.  I spent 15 minutes with the patient via telephone encounter.   Signed, Dagoberto Ligas [facility] Office: [phone]  07/04/2019, 2:33 PM

## 2019-07-05 ENCOUNTER — Other Ambulatory Visit: Payer: Self-pay | Admitting: *Deleted

## 2019-07-05 DIAGNOSIS — K551 Chronic vascular disorders of intestine: Secondary | ICD-10-CM

## 2019-07-10 ENCOUNTER — Ambulatory Visit: Payer: Medicare Other | Attending: Internal Medicine

## 2019-07-10 DIAGNOSIS — Z23 Encounter for immunization: Secondary | ICD-10-CM | POA: Insufficient documentation

## 2019-07-10 NOTE — Progress Notes (Signed)
   Covid-19 Vaccination Clinic  Name:  Adrian Ellis    MRN: [id number] DOB: 12/04/1940  07/10/2019  Adrian Ellis was observed post Covid-19 immunization for 15 minutes without incidence. He was provided with Vaccine Information Sheet and instruction to access the V-Safe system.   Adrian Ellis was instructed to call 911 with any severe reactions post vaccine: Marland Kitchen Difficulty breathing  . Swelling of your face and throat  . A fast heartbeat  . A bad rash all over your body  . Dizziness and weakness    Immunizations Administered    Name Date Dose VIS Date Route   Moderna COVID-19 Vaccine 07/10/2019 12:02 PM 0.5 mL 04/19/2019 Intramuscular   Manufacturer: Moderna   Lot: [id number]   [place]:[id number]

## 2019-07-16 ENCOUNTER — Ambulatory Visit: Payer: Self-pay

## 2019-07-17 DIAGNOSIS — E039 Hypothyroidism, unspecified: Secondary | ICD-10-CM | POA: Diagnosis not present

## 2019-07-17 DIAGNOSIS — I10 Essential (primary) hypertension: Secondary | ICD-10-CM | POA: Diagnosis not present

## 2019-08-25 DIAGNOSIS — R3121 Asymptomatic microscopic hematuria: Secondary | ICD-10-CM | POA: Diagnosis not present

## 2019-09-08 DIAGNOSIS — R3121 Asymptomatic microscopic hematuria: Secondary | ICD-10-CM | POA: Diagnosis not present

## 2019-10-12 DIAGNOSIS — I10 Essential (primary) hypertension: Secondary | ICD-10-CM | POA: Diagnosis not present

## 2019-10-12 DIAGNOSIS — E785 Hyperlipidemia, unspecified: Secondary | ICD-10-CM | POA: Diagnosis not present

## 2019-10-12 DIAGNOSIS — R7303 Prediabetes: Secondary | ICD-10-CM | POA: Diagnosis not present

## 2019-10-12 DIAGNOSIS — E039 Hypothyroidism, unspecified: Secondary | ICD-10-CM | POA: Diagnosis not present

## 2019-10-12 DIAGNOSIS — B353 Tinea pedis: Secondary | ICD-10-CM | POA: Diagnosis not present

## 2020-01-12 DIAGNOSIS — I10 Essential (primary) hypertension: Secondary | ICD-10-CM | POA: Diagnosis not present

## 2020-01-12 DIAGNOSIS — E038 Other specified hypothyroidism: Secondary | ICD-10-CM | POA: Diagnosis not present

## 2020-02-15 DIAGNOSIS — Z23 Encounter for immunization: Secondary | ICD-10-CM | POA: Diagnosis not present

## 2020-02-15 DIAGNOSIS — E782 Mixed hyperlipidemia: Secondary | ICD-10-CM | POA: Diagnosis not present

## 2020-02-15 DIAGNOSIS — K219 Gastro-esophageal reflux disease without esophagitis: Secondary | ICD-10-CM | POA: Diagnosis not present

## 2020-02-27 DIAGNOSIS — Z961 Presence of intraocular lens: Secondary | ICD-10-CM | POA: Diagnosis not present

## 2020-02-27 DIAGNOSIS — H35371 Puckering of macula, right eye: Secondary | ICD-10-CM | POA: Diagnosis not present

## 2020-03-13 DIAGNOSIS — E039 Hypothyroidism, unspecified: Secondary | ICD-10-CM | POA: Diagnosis not present

## 2020-03-13 DIAGNOSIS — I10 Essential (primary) hypertension: Secondary | ICD-10-CM | POA: Diagnosis not present

## 2020-03-13 DIAGNOSIS — R7303 Prediabetes: Secondary | ICD-10-CM | POA: Diagnosis not present

## 2020-03-13 DIAGNOSIS — E785 Hyperlipidemia, unspecified: Secondary | ICD-10-CM | POA: Diagnosis not present

## 2020-03-13 DIAGNOSIS — B353 Tinea pedis: Secondary | ICD-10-CM | POA: Diagnosis not present

## 2020-04-17 DIAGNOSIS — E039 Hypothyroidism, unspecified: Secondary | ICD-10-CM | POA: Diagnosis not present

## 2020-04-17 DIAGNOSIS — I10 Essential (primary) hypertension: Secondary | ICD-10-CM | POA: Diagnosis not present

## 2020-04-17 DIAGNOSIS — E7849 Other hyperlipidemia: Secondary | ICD-10-CM | POA: Diagnosis not present

## 2020-05-18 DIAGNOSIS — I10 Essential (primary) hypertension: Secondary | ICD-10-CM | POA: Diagnosis not present

## 2020-05-18 DIAGNOSIS — E7849 Other hyperlipidemia: Secondary | ICD-10-CM | POA: Diagnosis not present

## 2020-05-18 DIAGNOSIS — E039 Hypothyroidism, unspecified: Secondary | ICD-10-CM | POA: Diagnosis not present

## 2020-06-19 DIAGNOSIS — R7303 Prediabetes: Secondary | ICD-10-CM | POA: Diagnosis not present

## 2020-06-19 DIAGNOSIS — I10 Essential (primary) hypertension: Secondary | ICD-10-CM | POA: Diagnosis not present

## 2020-06-19 DIAGNOSIS — Z7189 Other specified counseling: Secondary | ICD-10-CM | POA: Diagnosis not present

## 2020-06-19 DIAGNOSIS — E039 Hypothyroidism, unspecified: Secondary | ICD-10-CM | POA: Diagnosis not present

## 2020-09-17 DIAGNOSIS — I10 Essential (primary) hypertension: Secondary | ICD-10-CM | POA: Diagnosis not present

## 2020-09-17 DIAGNOSIS — Z0001 Encounter for general adult medical examination with abnormal findings: Secondary | ICD-10-CM | POA: Diagnosis not present

## 2020-09-17 DIAGNOSIS — B353 Tinea pedis: Secondary | ICD-10-CM | POA: Diagnosis not present

## 2020-09-17 DIAGNOSIS — R7303 Prediabetes: Secondary | ICD-10-CM | POA: Diagnosis not present

## 2020-09-17 DIAGNOSIS — E039 Hypothyroidism, unspecified: Secondary | ICD-10-CM | POA: Diagnosis not present

## 2020-11-15 DIAGNOSIS — R319 Hematuria, unspecified: Secondary | ICD-10-CM | POA: Diagnosis not present

## 2020-11-15 DIAGNOSIS — I10 Essential (primary) hypertension: Secondary | ICD-10-CM | POA: Diagnosis not present

## 2020-11-15 DIAGNOSIS — E7849 Other hyperlipidemia: Secondary | ICD-10-CM | POA: Diagnosis not present

## 2020-11-15 DIAGNOSIS — E039 Hypothyroidism, unspecified: Secondary | ICD-10-CM | POA: Diagnosis not present

## 2020-12-07 ENCOUNTER — Encounter (HOSPITAL_COMMUNITY): Payer: Self-pay | Admitting: *Deleted

## 2020-12-07 ENCOUNTER — Ambulatory Visit (HOSPITAL_COMMUNITY)
Admission: EM | Admit: 2020-12-07 | Discharge: 2020-12-07 | Disposition: A | Discharge status: Nursing Home (Includes ICF) | Payer: Medicare Other | Attending: Internal Medicine | Admitting: Internal Medicine

## 2020-12-07 ENCOUNTER — Encounter (HOSPITAL_COMMUNITY): Payer: Self-pay | Admitting: Emergency Medicine

## 2020-12-07 ENCOUNTER — Emergency Department (HOSPITAL_COMMUNITY)
Admission: EM | Admit: 2020-12-07 | Discharge: 2020-12-07 | Disposition: A | Discharge status: Home / Self Care | Payer: Medicare Other | Attending: Emergency Medicine | Admitting: Emergency Medicine

## 2020-12-07 ENCOUNTER — Other Ambulatory Visit: Payer: Self-pay

## 2020-12-07 ENCOUNTER — Emergency Department (HOSPITAL_COMMUNITY): Payer: Medicare Other

## 2020-12-07 DIAGNOSIS — Y9389 Activity, other specified: Secondary | ICD-10-CM | POA: Diagnosis not present

## 2020-12-07 DIAGNOSIS — W228XXA Striking against or struck by other objects, initial encounter: Secondary | ICD-10-CM | POA: Insufficient documentation

## 2020-12-07 DIAGNOSIS — S61216A Laceration without foreign body of right little finger without damage to nail, initial encounter: Secondary | ICD-10-CM | POA: Insufficient documentation

## 2020-12-07 DIAGNOSIS — S61214A Laceration without foreign body of right ring finger without damage to nail, initial encounter: Secondary | ICD-10-CM | POA: Insufficient documentation

## 2020-12-07 DIAGNOSIS — Z87891 Personal history of nicotine dependence: Secondary | ICD-10-CM | POA: Diagnosis not present

## 2020-12-07 DIAGNOSIS — S61411A Laceration without foreign body of right hand, initial encounter: Secondary | ICD-10-CM | POA: Diagnosis not present

## 2020-12-07 DIAGNOSIS — I129 Hypertensive chronic kidney disease with stage 1 through stage 4 chronic kidney disease, or unspecified chronic kidney disease: Secondary | ICD-10-CM | POA: Diagnosis not present

## 2020-12-07 DIAGNOSIS — Z7982 Long term (current) use of aspirin: Secondary | ICD-10-CM | POA: Diagnosis not present

## 2020-12-07 DIAGNOSIS — Z8546 Personal history of malignant neoplasm of prostate: Secondary | ICD-10-CM | POA: Insufficient documentation

## 2020-12-07 DIAGNOSIS — M19041 Primary osteoarthritis, right hand: Secondary | ICD-10-CM | POA: Diagnosis not present

## 2020-12-07 DIAGNOSIS — N182 Chronic kidney disease, stage 2 (mild): Secondary | ICD-10-CM | POA: Insufficient documentation

## 2020-12-07 DIAGNOSIS — S6991XA Unspecified injury of right wrist, hand and finger(s), initial encounter: Secondary | ICD-10-CM | POA: Diagnosis present

## 2020-12-07 DIAGNOSIS — Z79899 Other long term (current) drug therapy: Secondary | ICD-10-CM | POA: Diagnosis not present

## 2020-12-07 DIAGNOSIS — E039 Hypothyroidism, unspecified: Secondary | ICD-10-CM | POA: Insufficient documentation

## 2020-12-07 DIAGNOSIS — S61219A Laceration without foreign body of unspecified finger without damage to nail, initial encounter: Secondary | ICD-10-CM

## 2020-12-07 MED ORDER — LIDOCAINE-EPINEPHRINE (PF) 2 %-1:200000 IJ SOLN
10.0000 mL | Freq: Once | INTRAMUSCULAR | Status: AC
  Administered 2020-12-07: 10 mL via INTRADERMAL

## 2020-12-07 NOTE — ED Triage Notes (Signed)
Pt arrives from [facility] for laceration to right hand while loading a stove on a truck.

## 2020-12-07 NOTE — ED Notes (Signed)
Pt stepped outside.  

## 2020-12-07 NOTE — ED Notes (Signed)
Gauze and Coban dressings to right ring and little fingers.

## 2020-12-07 NOTE — ED Provider Notes (Signed)
Emergency Medicine Provider Triage Evaluation Note  Gilroy Mckellips , a 80 y.o. male  was evaluated in triage.  Pt complains of patient presents with laceration to fourth and fifth finger.  Happened while lifting electric stove into the truck.  Last tetanus 2017. Not on blood thinners  Review of Systems  Positive: hand pain, laceration Negative:   Physical Exam  BP (!) 155/70   Pulse 89   Temp 98.6 F (37 C) (Oral)   Resp 16   SpO2 100%  Gen:   Awake, no distress   Resp:  Normal effort  MSK:   Moves extremities without difficulty  Other:  Cap refill <3  Medical Decision Making  Medically screening exam initiated at 1:45 PM.  Appropriate orders placed.  Martina Sinner was informed that the remainder of the evaluation will be completed by another provider, this initial triage assessment does not replace that evaluation, and the importance of remaining in the ED until their evaluation is complete.     Sherrill Raring, PA-C 12/07/20 1347    Pattricia Boss, MD 12/17/20 1350

## 2020-12-07 NOTE — ED Provider Notes (Signed)
[facility] EMERGENCY DEPARTMENT Provider Note   CSN: [id number] Arrival date & time: 12/07/20  1329     History Chief Complaint  Patient presents with   Laceration    Adrian Ellis is a 80 y.o. male.   Laceration  80 year old male PMHx CKD, HTN, hypothyroidism, celiac artery stenosis, presenting for laceration to right fourth and fifth digits that occurred while he was lifting a stove into his truck.  Pain is mild severity, nonradiating, stabbing quality, worsened with movement, improved by keeping still.  No other associated symptoms.  Not on any AC/AP.  Last tetanus 2017.  No further medical concerns at this time including further injury, fevers, chills, sweats, epistaxis, chest pain, shortness of breath, N/V, focal weakness/paresthesias.  History obtained from patient and chart review.   Past Medical History:  Diagnosis Date   Arthritis    "right shoulder" (02/25/2017)   CKD (chronic kidney disease), stage II    GERD (gastroesophageal reflux disease)    Hypertension    Hypothyroidism    Lower urinary tract symptoms (LUTS)    Microhematuria    history of, work-up in 2009 negative   Prostate cancer ([place]) 02/23/14   gleason 3+4=7, volume 25.98 cc   Prostate enlargement    Sinus problem    Thyroid disease     Patient Active Problem List   Diagnosis Date Noted   Syncope 11/22/2017   Celiac artery stenosis (HCC)    Acute blood loss anemia    GI bleed 02/25/2017   AKI (acute kidney injury) ([place])    Bleeding gastrointestinal    Rectal bleeding 02/25/2015   Acute on chronic kidney failure ([place]) 02/25/2015   Hypothyroidism    Thyroid activity decreased    Malignant neoplasm of prostate ([place]) 03/09/2014   Syncope 04/23/2013   Hypertension 04/23/2013   Prostate enlargement     Past Surgical History:  Procedure Laterality Date   CATARACT EXTRACTION W/ INTRAOCULAR LENS  IMPLANT, BILATERAL Bilateral    COLONOSCOPY  2004    ESOPHAGOGASTRODUODENOSCOPY N/A 02/26/2017   Procedure: ESOPHAGOGASTRODUODENOSCOPY (EGD);  Surgeon: Carol Ada, MD;  Location: [facility];  Service: Endoscopy;  Laterality: N/A;   FLEXIBLE SIGMOIDOSCOPY N/A 03/09/2015   Procedure: FLEXIBLE SIGMOIDOSCOPY;  Surgeon: Carol Ada, MD;  Location: [facility];  Service: Endoscopy;  Laterality: N/A;   FLEXIBLE SIGMOIDOSCOPY N/A 02/26/2017   Procedure: FLEXIBLE SIGMOIDOSCOPY;  Surgeon: Carol Ada, MD;  Location: [facility];  Service: Endoscopy;  Laterality: N/A;   PROSTATE BIOPSY  02/23/2014   Gleason 7, volume 25.98 cc   TONSILLECTOMY  1953       Family History  Problem Relation Age of Onset   Aneurysm Mother    Cancer Brother     Social History   Tobacco Use   Smoking status: Former    Packs/day: 0.25    Years: 30.00    Pack years: 7.50    Types: Cigarettes   Smokeless tobacco: Never   Tobacco comments:    02/25/2017 'quit smoking in the 1990s"  Vaping Use   Vaping Use: Never used  Substance Use Topics   Alcohol use: Not Currently   Drug use: Never    Home Medications Prior to Admission medications   Medication Sig Start Date End Date Taking? Authorizing Provider  amlodipine-olmesartan (AZOR) 10-20 MG tablet Take 1 tablet by mouth daily. 10/31/17   [provider]  aspirin 81 MG chewable tablet Chew 81 mg by mouth daily.    [provider]  aspirin  EC 81 MG tablet Take 81 mg by mouth daily.    [provider]  clotrimazole-betamethasone (LOTRISONE) cream APPLY BETWEEN TOES TWICE A DAY 08/25/17   [provider]  clotrimazole-betamethasone (LOTRISONE) cream Apply 1 application topically as needed (between toes).  08/25/17   [provider]  finasteride (PROSCAR) 5 MG tablet Take 5 mg by mouth daily.    [provider]  finasteride (PROSCAR) 5 MG tablet Take 5 mg by mouth daily. 10/28/17   [provider]  FLUAD 0.5 ML SUSY ADM 0.5ML IM UTD 03/11/18   [provider]  levothyroxine (SYNTHROID, LEVOTHROID) 112 MCG tablet Take 125 mcg by mouth daily before breakfast.    [provider]  levothyroxine (SYNTHROID, LEVOTHROID) 112 MCG tablet Take 112 mcg by mouth daily. 09/29/17   [provider]  Multiple Vitamin (MULTIVITAMIN WITH MINERALS) TABS Take 1 tablet by mouth daily.    [provider]  Multiple Vitamins-Minerals (MULTIVITAMIN WITH MINERALS) tablet Take 1 tablet by mouth daily.    [provider]  tamsulosin (FLOMAX) 0.4 MG CAPS capsule Take 0.4 mg by mouth daily.    [provider]  tamsulosin (FLOMAX) 0.4 MG CAPS capsule Take 0.4 mg by mouth at bedtime. 09/07/17   [provider]    Allergies    Patient has no known allergies.  Review of Systems   Review of Systems  All other systems reviewed and are negative.  Physical Exam Updated Vital Signs BP (!) 157/71 (BP Location: Left Arm)   Pulse 73   Temp 97.8 F (36.6 C) (Oral)   Resp 19   SpO2 98%   Physical Exam Vitals and nursing note reviewed.  Constitutional:      General: He is not in acute distress. HENT:     Head: Normocephalic and atraumatic.  Eyes:     Conjunctiva/sclera: Conjunctivae normal.     Pupils: Pupils are equal, round, and reactive to light.  Cardiovascular:     Rate and Rhythm: Normal rate and regular rhythm.  Pulmonary:     Effort: Pulmonary effort is normal. No respiratory distress.  Musculoskeletal:     Cervical back: Normal range of motion. No rigidity.     Right lower leg: No edema.     Left lower leg: No edema.  Skin:    General: Skin is warm and dry.     Comments: 2 cm lacerations on palmar aspect of right fourth and fifth digits, hemostatic, no foreign bodies visualized.  Fingers are neurovascularly intact with good flexion extension.  Neurological:     Mental Status: He is alert and oriented to person, place, and time. Mental status is at baseline.  Psychiatric:        Mood and  Affect: Mood normal.        Behavior: Behavior normal.    ED Results / Procedures / Treatments   Labs (all labs ordered are listed, but only abnormal results are displayed) Labs Reviewed - No data to display  EKG None  Radiology DG Hand Complete Right  Result Date: 12/07/2020 CLINICAL DATA:  Hand injury with laceration of the fourth and fifth digits EXAM: RIGHT HAND - COMPLETE 3+ VIEW COMPARISON:  None. FINDINGS: Bandaging along the ring and small fingers noted. No other unexpected foreign body or findings of fracture/dislocation. Mild degenerative findings including spurring at the distal interphalangeal joints and at the first carpometacarpal articulation. No compelling findings of gas tracking in the soft tissues. IMPRESSION: 1. No fracture  or unexpected foreign body identified. 2. Mild degenerative findings in the hand. Electronically Signed   By: Van Clines M.D.   On: 12/07/2020 14:36    Procedures .Marland KitchenLaceration Repair  Date/Time: 12/07/2020 6:53 PM Performed by: Levin Bacon, MD Authorized by: Lajean Saver, MD   Consent:    Consent obtained:  Verbal   Consent given by:  Patient   Risks, benefits, and alternatives were discussed: yes     Risks discussed:  Infection, pain, poor cosmetic result and poor wound healing   Alternatives discussed:  No treatment Universal protocol:    Procedure explained and questions answered to patient or proxy's satisfaction: yes     Required blood products, implants, devices, and special equipment available: yes     Patient identity confirmed:  Arm band Anesthesia:    Anesthesia method:  Local infiltration   Local anesthetic:  Lidocaine 2% WITH epi Laceration details:    Location: 2 cm linear over left fourth and fifth digits each. Pre-procedure details:    Preparation:  Patient was prepped and draped in usual sterile fashion Exploration:    Limited defect created (wound extended): no     Imaging obtained: x-ray      Imaging outcome: foreign body not noted     Wound exploration: wound explored through full range of motion and entire depth of wound visualized     Contaminated: no   Treatment:    Area cleansed with:  Saline   Amount of cleaning:  Standard   Irrigation solution:  Sterile saline   Irrigation volume:  200 mL   Irrigation method: Squeeze bulb.   Debridement:  None   Undermining:  None   Scar revision: no   Skin repair:    Repair method:  Sutures   Suture size:  5-0   Suture material:  Nylon   Suture technique:  Simple interrupted   Number of sutures:  7 Approximation:    Approximation:  Close Repair type:    Repair type:  Simple Post-procedure details:    Dressing:  Open (no dressing)   Procedure completion:  Tolerated   Medications Ordered in ED Medications  lidocaine-EPINEPHrine (XYLOCAINE W/EPI) 2 %-1:200000 (PF) injection 10 mL (10 mLs Intradermal Given by Other 12/07/20 1853)    ED Course  I have reviewed the triage vital signs and the nursing notes.  Pertinent labs & imaging results that were available during my care of the patient were reviewed by me and considered in my medical decision making (see chart for details).    MDM Rules/Calculators/A&P                          This is an 80 year old male with no relevant past medical history presenting for 2 linear lacerations on his right fourth and fifth digits.  The lacerations are superficial, hemostatic with no foreign body visualized.  Fingers are neurovascularly intact distally.  No further injury appreciated.  Initial interventions: Digital blocks were performed of the right fourth and fifth digits and lacerations were sutured as above.  Patient tolerated well.  All studies independently reviewed by myself, d/w the attending physician, factored into my MDM. -X-ray was reviewed prior to suturing.  No evidence of foreign body in either finger.  Presentation appears most consistent with laceration, superficially, to  right fourth and fifth digits with no complications.  No evidence of fracture, dislocation, or neurovascular injury on exam as well as on imaging.  Good range of  motion, suggest against nerve or tendon disruption.  Tetanus is up-to-date.  No signs of infection on exam.  Difficult feel the patient stable for discharge home with outpatient follow-up for suture removal in 7 days.  Return precautions discussed.  He understands and agrees.  Patient HDS on reevaluation, subsequently discharged.  Final Clinical Impression(s) / ED Diagnoses Final diagnoses:  Laceration of finger of right hand without foreign body without damage to nail, unspecified finger, initial encounter    Rx / DC Orders ED Discharge Orders     None        Levin Bacon, MD 12/08/20 [id number]    Lajean Saver, MD 12/10/20 1720

## 2020-12-07 NOTE — Discharge Instructions (Addendum)
You were seen in the ER for the cuts to your fingers.  These were sutured with 3 stitches on your ring finger and 4 stitches on your little finger.  You should follow-up with your primary care doctor in 1 week for removal of the stitches.  Please return to the ER for any new fevers, worsening pain, pus coming out of the wound, redness, swelling.

## 2020-12-07 NOTE — ED Notes (Signed)
Per Inocencio Homes, NP - recommendation of pt to go to ED for further eval and treatment.  Patient is being discharged from the [facility] and sent to the Emergency Department via private vehicle. Per Inocencio Homes, NP, patient is stable but in need of higher level of care due to lacerations to right ring and little fingers. Patient is aware and verbalizes understanding of plan of care.  Vitals:   12/07/20 1257  BP: (!) 163/55  Pulse: 86  Resp: 20  Temp: 98.6 F (37 C)  SpO2: 97%

## 2020-12-07 NOTE — ED Triage Notes (Signed)
Pt reports lifting a stove onto a truck @ approx 1100 today when he sustained lacerations to right ring and little fingers.  Constant significant oozing to right ring finger laceration.  CMS intact.

## 2020-12-16 DIAGNOSIS — E039 Hypothyroidism, unspecified: Secondary | ICD-10-CM | POA: Diagnosis not present

## 2020-12-16 DIAGNOSIS — E7849 Other hyperlipidemia: Secondary | ICD-10-CM | POA: Diagnosis not present

## 2020-12-16 DIAGNOSIS — I10 Essential (primary) hypertension: Secondary | ICD-10-CM | POA: Diagnosis not present

## 2020-12-18 DIAGNOSIS — R7303 Prediabetes: Secondary | ICD-10-CM | POA: Diagnosis not present

## 2020-12-18 DIAGNOSIS — I10 Essential (primary) hypertension: Secondary | ICD-10-CM | POA: Diagnosis not present

## 2020-12-18 DIAGNOSIS — Z4802 Encounter for removal of sutures: Secondary | ICD-10-CM | POA: Diagnosis not present

## 2020-12-18 DIAGNOSIS — E1169 Type 2 diabetes mellitus with other specified complication: Secondary | ICD-10-CM | POA: Diagnosis not present

## 2020-12-18 DIAGNOSIS — E039 Hypothyroidism, unspecified: Secondary | ICD-10-CM | POA: Diagnosis not present

## 2021-02-15 DIAGNOSIS — I10 Essential (primary) hypertension: Secondary | ICD-10-CM | POA: Diagnosis not present

## 2021-02-15 DIAGNOSIS — E7849 Other hyperlipidemia: Secondary | ICD-10-CM | POA: Diagnosis not present

## 2021-02-15 DIAGNOSIS — E039 Hypothyroidism, unspecified: Secondary | ICD-10-CM | POA: Diagnosis not present

## 2021-03-18 DIAGNOSIS — E7849 Other hyperlipidemia: Secondary | ICD-10-CM | POA: Diagnosis not present

## 2021-03-18 DIAGNOSIS — E039 Hypothyroidism, unspecified: Secondary | ICD-10-CM | POA: Diagnosis not present

## 2021-03-18 DIAGNOSIS — I10 Essential (primary) hypertension: Secondary | ICD-10-CM | POA: Diagnosis not present

## 2021-03-19 DIAGNOSIS — R7303 Prediabetes: Secondary | ICD-10-CM | POA: Diagnosis not present

## 2021-03-19 DIAGNOSIS — B353 Tinea pedis: Secondary | ICD-10-CM | POA: Diagnosis not present

## 2021-03-19 DIAGNOSIS — M25511 Pain in right shoulder: Secondary | ICD-10-CM | POA: Diagnosis not present

## 2021-03-19 DIAGNOSIS — E039 Hypothyroidism, unspecified: Secondary | ICD-10-CM | POA: Diagnosis not present

## 2021-03-19 DIAGNOSIS — I10 Essential (primary) hypertension: Secondary | ICD-10-CM | POA: Diagnosis not present

## 2021-05-01 ENCOUNTER — Ambulatory Visit (HOSPITAL_COMMUNITY)
Admission: EM | Admit: 2021-05-01 | Discharge: 2021-05-01 | Disposition: A | Discharge status: Home / Self Care | Payer: Medicare Other | Attending: Urgent Care | Admitting: Urgent Care

## 2021-05-01 ENCOUNTER — Encounter (HOSPITAL_COMMUNITY): Payer: Self-pay | Admitting: Emergency Medicine

## 2021-05-01 ENCOUNTER — Other Ambulatory Visit: Payer: Self-pay

## 2021-05-01 DIAGNOSIS — I10 Essential (primary) hypertension: Secondary | ICD-10-CM

## 2021-05-01 DIAGNOSIS — R030 Elevated blood-pressure reading, without diagnosis of hypertension: Secondary | ICD-10-CM

## 2021-05-01 MED ORDER — HYDROCHLOROTHIAZIDE 12.5 MG PO TABS: 12.5000 mg | ORAL_TABLET | Freq: Every day | ORAL | 0 refills | Status: DC

## 2021-05-01 NOTE — ED Provider Notes (Signed)
[facility]   MRN: [id number] DOB: [birth year]  Subjective:   Adrian Ellis is a 80 y.o. male presenting for a checkup on his blood pressure.  Patient [place] the fire station to have the blood pressure checked as he does normally.  He was found to have an elevated above 671 systolic.  They recommended that he come in for a checkup.  No headache, dizziness, confusion, vision change, chest pain, shortness of breath, diaphoresis, abdominal pain, nausea, vomiting.  He gets regular follow-up with his PCP.  His next appointment is scheduled for February or March.  No history of heart disease, stroke, MI.  Patient is seen by Dr. Berdine Addison, unfortunately I do not have access to their medical records.  No current facility-administered medications for this encounter.  Current Outpatient Medications:    amlodipine-olmesartan (AZOR) 10-20 MG tablet, Take 1 tablet by mouth daily., Disp: , Rfl: 1   aspirin 81 MG chewable tablet, Chew 81 mg by mouth daily., Disp: , Rfl:    aspirin EC 81 MG tablet, Take 81 mg by mouth daily., Disp: , Rfl:    clotrimazole-betamethasone (LOTRISONE) cream, APPLY BETWEEN TOES TWICE A DAY, Disp: , Rfl: 1   clotrimazole-betamethasone (LOTRISONE) cream, Apply 1 application topically as needed (between toes). , Disp: , Rfl: 1   finasteride (PROSCAR) 5 MG tablet, Take 5 mg by mouth daily., Disp: , Rfl:    finasteride (PROSCAR) 5 MG tablet, Take 5 mg by mouth daily., Disp: , Rfl: 11   FLUAD 0.5 ML SUSY, ADM 0.5ML IM UTD, Disp: , Rfl:    levothyroxine (SYNTHROID, LEVOTHROID) 112 MCG tablet, Take 125 mcg by mouth daily before breakfast., Disp: , Rfl:    levothyroxine (SYNTHROID, LEVOTHROID) 112 MCG tablet, Take 112 mcg by mouth daily., Disp: , Rfl:    Multiple Vitamin (MULTIVITAMIN WITH MINERALS) TABS, Take 1 tablet by mouth daily., Disp: , Rfl:    Multiple Vitamins-Minerals (MULTIVITAMIN WITH MINERALS) tablet, Take 1 tablet by mouth daily., Disp: , Rfl:    tamsulosin  (FLOMAX) 0.4 MG CAPS capsule, Take 0.4 mg by mouth daily., Disp: , Rfl:    tamsulosin (FLOMAX) 0.4 MG CAPS capsule, Take 0.4 mg by mouth at bedtime., Disp: , Rfl: 11   No Known Allergies  Past Medical History:  Diagnosis Date   Arthritis    "right shoulder" (02/25/2017)   CKD (chronic kidney disease), stage II    GERD (gastroesophageal reflux disease)    Hypertension    Hypothyroidism    Lower urinary tract symptoms (LUTS)    Microhematuria    history of, work-up in 2009 negative   Prostate cancer ([place]) 02/23/14   gleason 3+4=7, volume 25.98 cc   Prostate enlargement    Sinus problem    Thyroid disease      Past Surgical History:  Procedure Laterality Date   CATARACT EXTRACTION W/ INTRAOCULAR LENS  IMPLANT, BILATERAL Bilateral    COLONOSCOPY  2004   ESOPHAGOGASTRODUODENOSCOPY N/A 02/26/2017   Procedure: ESOPHAGOGASTRODUODENOSCOPY (EGD);  Surgeon: Carol Ada, MD;  Location: [facility];  Service: Endoscopy;  Laterality: N/A;   FLEXIBLE SIGMOIDOSCOPY N/A 03/09/2015   Procedure: FLEXIBLE SIGMOIDOSCOPY;  Surgeon: Carol Ada, MD;  [facility];  Service: Endoscopy;  Laterality: N/A;   FLEXIBLE SIGMOIDOSCOPY N/A 02/26/2017   Procedure: FLEXIBLE SIGMOIDOSCOPY;  Surgeon: Carol Ada, MD;  Location: [facility];  Service: Endoscopy;  Laterality: N/A;   PROSTATE BIOPSY  02/23/2014   Gleason 7, volume 25.98 cc   TONSILLECTOMY  29    Family History  Problem Relation Age of Onset   Aneurysm Mother    Cancer Brother     Social History   Tobacco Use   Smoking status: Former    Packs/day: 0.25    Years: 30.00    Pack years: 7.50    Types: Cigarettes   Smokeless tobacco: Never   Tobacco comments:    02/25/2017 'quit smoking in the 1990s"  Vaping Use   Vaping Use: Never used  Substance Use Topics   Alcohol use: Not Currently   Drug use: Never    ROS   Objective:   Vitals: BP (!) 166/54 (BP Location: Right Arm)    Pulse 88    Temp 98.8 F (37.1 C)  (Oral)    Resp 17    SpO2 100%   BP Readings from Last 3 Encounters:  05/01/21 (!) 166/54  12/07/20 (!) 157/71  12/07/20 (!) 163/55   Physical Exam Constitutional:      General: He is not in acute distress.    Appearance: Normal appearance. He is well-developed. He is not ill-appearing, toxic-appearing or diaphoretic.  HENT:     Head: Normocephalic and atraumatic.     Right Ear: External ear normal.     Left Ear: External ear normal.     Nose: Nose normal.     Mouth/Throat:     Mouth: Mucous membranes are moist.     Pharynx: Oropharynx is clear.  Eyes:     General: No scleral icterus.    Extraocular Movements: Extraocular movements intact.     Pupils: Pupils are equal, round, and reactive to light.  Cardiovascular:     Rate and Rhythm: Normal rate and regular rhythm.     Heart sounds: Normal heart sounds. No murmur heard.   No friction rub. No gallop.  Pulmonary:     Effort: Pulmonary effort is normal. No respiratory distress.     Breath sounds: Normal breath sounds. No stridor. No wheezing, rhonchi or rales.  Neurological:     Mental Status: He is alert and oriented to person, place, and time.     Cranial Nerves: No cranial nerve deficit.     Motor: No weakness.     Coordination: Coordination normal.     Gait: Gait normal.     Comments: Negative Romberg and pronator drift.  Psychiatric:        Mood and Affect: Mood normal.        Behavior: Behavior normal.        Thought Content: Thought content normal.    Assessment and Plan :   PDMP not reviewed this encounter.  1. Essential hypertension   2. Elevated blood pressure reading     Low suspicion for stroke, hypertensive emergency. Patient has a normal neurologic exam, negative Romberg and pronator drift. Recommended that he start hydrochlorothiazide in addition to what we have on record which is amlodipine olmesartan.  Emphasized need for very close follow-up with his PCP.  Will defer ER visit for now. Counseled  patient on potential for adverse effects with medications prescribed/recommended today, ER and return-to-clinic precautions discussed, patient verbalized understanding.    Jaynee Eagles, [place] 05/01/21 7408

## 2021-05-01 NOTE — Discharge Instructions (Signed)
I would like you to start hydrochlorothiazide in addition to your current blood pressure medication of amlodipine-olmesartan (Azor). Take 1 tablet of hydrochlorothiazide daily and 1 tablet of Azor daily. Make sure you call your regular doctor to see if they would like to keep you on this medication.     For diabetes or elevated blood sugar, please make sure you are limiting and avoiding starchy, carbohydrate foods like pasta, breads, sweet breads, pastry, rice, potatoes, desserts. These foods can elevate your blood sugar. Also, limit and avoid drinks that contain a lot of sugar such as sodas, sweet teas, fruit juices.  Drinking plain water will be much more helpful, try 64 ounces of water daily.  It is okay to flavor your water naturally by cutting cucumber, lemon, mint or lime, placing it in a picture with water and drinking it over a period of 24-48 hours as long as it remains refrigerated.  For elevated blood pressure, make sure you are monitoring salt in your diet.  Do not eat restaurant foods and limit processed foods at home. I highly recommend you prepare and cook your own foods at home.  Processed foods include things like frozen meals, pre-seasoned meats and dinners, deli meats, canned foods as these foods contain a high amount of sodium/salt.  Make sure you are paying attention to sodium labels on foods you buy at the grocery store. Buy your spices separately such as garlic powder, onion powder, cumin, cayenne, parsley flakes so that you can avoid seasonings that contain salt. However, salt-free seasonings are available and can be used, an example is Mrs. Dash and includes a lot of different mixtures that do not contain salt.  Lastly, when cooking using oils that are healthier for you is important. This includes olive oil, avocado oil, canola oil. We have discussed a lot of foods to avoid but below is a list of foods that can be very healthy to use in your diet whether it is for diabetes,  cholesterol, high blood pressure, or in general healthy eating.  Salads - kale, spinach, cabbage, spring mix, arugula Fruits - avocadoes, berries (blueberries, raspberries, blackberries), apples, oranges, pomegranate, grapefruit, kiwi Vegetables - asparagus, cauliflower, broccoli, green beans, brussel sprouts, bell peppers, beets; stay away from or limit starchy vegetables like potatoes, carrots, peas Other general foods - kidney beans, egg whites, almonds, walnuts, sunflower seeds, pumpkin seeds, fat free yogurt, almond milk, flax seeds, quinoa, oats  Meat - It is better to eat lean meats and limit your red meat including pork to once a week.  Wild caught fish, chicken breast are good options as they tend to be leaner sources of good protein. Still be mindful of the sodium labels for the meats you buy.  DO NOT EAT ANY FOODS ON THIS LIST THAT YOU ARE ALLERGIC TO. For more specific needs, I highly recommend consulting a dietician or nutritionist but this can definitely be a good starting point.

## 2021-05-17 DIAGNOSIS — E039 Hypothyroidism, unspecified: Secondary | ICD-10-CM | POA: Diagnosis not present

## 2021-05-17 DIAGNOSIS — E7849 Other hyperlipidemia: Secondary | ICD-10-CM | POA: Diagnosis not present

## 2021-05-17 DIAGNOSIS — I10 Essential (primary) hypertension: Secondary | ICD-10-CM | POA: Diagnosis not present

## 2021-06-03 DIAGNOSIS — E039 Hypothyroidism, unspecified: Secondary | ICD-10-CM | POA: Diagnosis not present

## 2021-06-03 DIAGNOSIS — E7849 Other hyperlipidemia: Secondary | ICD-10-CM | POA: Diagnosis not present

## 2021-06-03 DIAGNOSIS — I10 Essential (primary) hypertension: Secondary | ICD-10-CM | POA: Diagnosis not present

## 2021-06-05 ENCOUNTER — Observation Stay (HOSPITAL_COMMUNITY)
Admission: EM | Admit: 2021-06-05 | Discharge: 2021-06-06 | Disposition: A | Discharge status: Home / Self Care | Payer: Medicare Other | Attending: Internal Medicine | Admitting: Internal Medicine

## 2021-06-05 ENCOUNTER — Emergency Department (HOSPITAL_COMMUNITY): Payer: Medicare Other

## 2021-06-05 ENCOUNTER — Other Ambulatory Visit: Payer: Self-pay

## 2021-06-05 ENCOUNTER — Encounter (HOSPITAL_COMMUNITY): Payer: Self-pay | Admitting: Family Medicine

## 2021-06-05 DIAGNOSIS — E119 Type 2 diabetes mellitus without complications: Secondary | ICD-10-CM

## 2021-06-05 DIAGNOSIS — C61 Malignant neoplasm of prostate: Secondary | ICD-10-CM | POA: Diagnosis present

## 2021-06-05 DIAGNOSIS — R079 Chest pain, unspecified: Principal | ICD-10-CM | POA: Diagnosis present

## 2021-06-05 DIAGNOSIS — I10 Essential (primary) hypertension: Secondary | ICD-10-CM | POA: Diagnosis present

## 2021-06-05 DIAGNOSIS — Z20822 Contact with and (suspected) exposure to covid-19: Secondary | ICD-10-CM | POA: Diagnosis not present

## 2021-06-05 DIAGNOSIS — Z8546 Personal history of malignant neoplasm of prostate: Secondary | ICD-10-CM | POA: Insufficient documentation

## 2021-06-05 DIAGNOSIS — E1122 Type 2 diabetes mellitus with diabetic chronic kidney disease: Secondary | ICD-10-CM | POA: Diagnosis not present

## 2021-06-05 DIAGNOSIS — Z79899 Other long term (current) drug therapy: Secondary | ICD-10-CM | POA: Diagnosis not present

## 2021-06-05 DIAGNOSIS — N182 Chronic kidney disease, stage 2 (mild): Secondary | ICD-10-CM | POA: Insufficient documentation

## 2021-06-05 DIAGNOSIS — E039 Hypothyroidism, unspecified: Secondary | ICD-10-CM | POA: Diagnosis present

## 2021-06-05 DIAGNOSIS — I129 Hypertensive chronic kidney disease with stage 1 through stage 4 chronic kidney disease, or unspecified chronic kidney disease: Secondary | ICD-10-CM | POA: Insufficient documentation

## 2021-06-05 DIAGNOSIS — Z7982 Long term (current) use of aspirin: Secondary | ICD-10-CM | POA: Insufficient documentation

## 2021-06-05 DIAGNOSIS — N189 Chronic kidney disease, unspecified: Secondary | ICD-10-CM | POA: Diagnosis present

## 2021-06-05 DIAGNOSIS — Z87891 Personal history of nicotine dependence: Secondary | ICD-10-CM | POA: Diagnosis not present

## 2021-06-05 DIAGNOSIS — E871 Hypo-osmolality and hyponatremia: Secondary | ICD-10-CM | POA: Diagnosis not present

## 2021-06-05 DIAGNOSIS — R413 Other amnesia: Secondary | ICD-10-CM | POA: Diagnosis present

## 2021-06-05 DIAGNOSIS — R0789 Other chest pain: Secondary | ICD-10-CM | POA: Diagnosis not present

## 2021-06-05 HISTORY — DX: Type 2 diabetes mellitus without complications: E11.9

## 2021-06-05 LAB — CBC
HCT: 38.6 % — ABNORMAL LOW (ref 39.0–52.0)
Hemoglobin: 12.9 g/dL — ABNORMAL LOW (ref 13.0–17.0)
MCH: 30.2 pg (ref 26.0–34.0)
MCHC: 33.4 g/dL (ref 30.0–36.0)
MCV: 90.4 fL (ref 80.0–100.0)
Platelets: 287 10*3/uL (ref 150–400)
RBC: 4.27 MIL/uL (ref 4.22–5.81)
RDW: 12.4 % (ref 11.5–15.5)
WBC: 11.8 10*3/uL — ABNORMAL HIGH (ref 4.0–10.5)
nRBC: 0 % (ref 0.0–0.2)

## 2021-06-05 LAB — TROPONIN I (HIGH SENSITIVITY)
Troponin I (High Sensitivity): 4 ng/L (ref <18)
Troponin I (High Sensitivity): 6 ng/L (ref <18)

## 2021-06-05 LAB — BASIC METABOLIC PANEL
Anion gap: 11 (ref 5–15)
BUN: 14 mg/dL (ref 8–23)
CO2: 24 mmol/L (ref 22–32)
Calcium: 9.6 mg/dL (ref 8.9–10.3)
Chloride: 93 mmol/L — ABNORMAL LOW (ref 98–111)
Creatinine, Ser: 1.3 mg/dL — ABNORMAL HIGH (ref 0.61–1.24)
GFR, Estimated: 55 mL/min — ABNORMAL LOW (ref >60)
Glucose, Bld: 138 mg/dL — ABNORMAL HIGH (ref 70–99)
Potassium: 4.2 mmol/L (ref 3.5–5.1)
Sodium: 128 mmol/L — ABNORMAL LOW (ref 135–145)

## 2021-06-05 LAB — RESP PANEL BY RT-PCR (FLU A&B, COVID) ARPGX2
Influenza A by PCR: NEGATIVE
Influenza B by PCR: NEGATIVE
SARS Coronavirus 2 by RT PCR: NEGATIVE

## 2021-06-05 LAB — TSH: TSH: <0.01 u[IU]/mL — ABNORMAL LOW (ref 0.350–4.500)

## 2021-06-05 LAB — HEMOGLOBIN A1C
Hgb A1c MFr Bld: 6.2 % — ABNORMAL HIGH (ref 4.8–5.6)
Mean Plasma Glucose: 131.24 mg/dL

## 2021-06-05 LAB — C-REACTIVE PROTEIN: CRP: 0.5 mg/dL (ref <1.0)

## 2021-06-05 LAB — SEDIMENTATION RATE: Sed Rate: 7 mm/hr (ref 0–16)

## 2021-06-05 LAB — SODIUM, URINE, RANDOM: Sodium, Ur: 43 mmol/L

## 2021-06-05 LAB — OSMOLALITY, URINE: Osmolality, Ur: 170 mOsm/kg — ABNORMAL LOW (ref 300–900)

## 2021-06-05 MED ORDER — CLOTRIMAZOLE 1 % EX CREA
1.0000 "application " | TOPICAL_CREAM | Freq: Two times a day (BID) | CUTANEOUS | Status: DC
  Administered 2021-06-05 – 2021-06-06 (×2): 1 via TOPICAL
  Filled 2021-06-05: qty 15

## 2021-06-05 MED ORDER — HYDRALAZINE HCL 10 MG PO TABS: 10.0000 mg | ORAL_TABLET | Freq: Three times a day (TID) | ORAL | Status: DC | PRN

## 2021-06-05 MED ORDER — IRBESARTAN 150 MG PO TABS
150.0000 mg | ORAL_TABLET | Freq: Every day | ORAL | Status: DC
  Administered 2021-06-06: 150 mg via ORAL
  Filled 2021-06-05: qty 1

## 2021-06-05 MED ORDER — ASPIRIN 81 MG PO CHEW
81.0000 mg | CHEWABLE_TABLET | Freq: Every day | ORAL | Status: DC
  Administered 2021-06-06: 81 mg via ORAL
  Filled 2021-06-05: qty 1

## 2021-06-05 MED ORDER — SODIUM CHLORIDE 0.9% FLUSH: 3.0000 mL | INTRAVENOUS | Status: DC | PRN

## 2021-06-05 MED ORDER — ACETAMINOPHEN 325 MG PO TABS
650.0000 mg | ORAL_TABLET | Freq: Four times a day (QID) | ORAL | Status: DC | PRN
  Administered 2021-06-05: 650 mg via ORAL
  Filled 2021-06-05: qty 2

## 2021-06-05 MED ORDER — ACETAMINOPHEN 650 MG RE SUPP: 650.0000 mg | Freq: Four times a day (QID) | RECTAL | Status: DC | PRN

## 2021-06-05 MED ORDER — SODIUM CHLORIDE 0.9 % IV SOLN: 250.0000 mL | INTRAVENOUS | Status: DC | PRN

## 2021-06-05 MED ORDER — AMLODIPINE-OLMESARTAN 10-20 MG PO TABS: 1.0000 | ORAL_TABLET | Freq: Every day | ORAL | Status: DC

## 2021-06-05 MED ORDER — FINASTERIDE 5 MG PO TABS
5.0000 mg | ORAL_TABLET | Freq: Every day | ORAL | Status: DC
  Administered 2021-06-06: 5 mg via ORAL
  Filled 2021-06-05: qty 1

## 2021-06-05 MED ORDER — TAMSULOSIN HCL 0.4 MG PO CAPS
0.4000 mg | ORAL_CAPSULE | Freq: Every day | ORAL | Status: DC
  Administered 2021-06-05: 0.4 mg via ORAL
  Filled 2021-06-05: qty 1

## 2021-06-05 MED ORDER — SODIUM CHLORIDE 0.9% FLUSH
3.0000 mL | Freq: Two times a day (BID) | INTRAVENOUS | Status: DC
  Administered 2021-06-05 – 2021-06-06 (×2): 3 mL via INTRAVENOUS

## 2021-06-05 MED ORDER — LEVOTHYROXINE SODIUM 100 MCG PO TABS
100.0000 ug | ORAL_TABLET | Freq: Every day | ORAL | Status: DC
  Administered 2021-06-06: 100 ug via ORAL
  Filled 2021-06-05: qty 1

## 2021-06-05 MED ORDER — AMLODIPINE BESYLATE 5 MG PO TABS
10.0000 mg | ORAL_TABLET | Freq: Every day | ORAL | Status: DC
  Administered 2021-06-06: 10 mg via ORAL
  Filled 2021-06-05: qty 2

## 2021-06-05 MED ORDER — ENOXAPARIN SODIUM 40 MG/0.4ML IJ SOSY
40.0000 mg | PREFILLED_SYRINGE | INTRAMUSCULAR | Status: DC
  Administered 2021-06-05: 40 mg via SUBCUTANEOUS
  Filled 2021-06-05: qty 0.4

## 2021-06-05 NOTE — ED Provider Triage Note (Signed)
Emergency Medicine Provider Triage Evaluation Note  Adrian Ellis , a 81 y.o. male  was evaluated in triage.  Pt complains of Chest pain for one week.  Its in his upper chest.  No SHOB, no N/V.  Did have one episode of diarrhea on Wednesday.  His chest pain is intermittent.  No aggravating or alleviating factors noted.  Review of Systems  Positive: See above Negative:   Physical Exam  BP (!) 174/57    Pulse 89    Temp 98.2 F (36.8 C) (Oral)    SpO2 99%  Gen:   Awake, no distress   Resp:  Normal effort  MSK:   Moves extremities without difficulty  Other:  Normal speech. No distress.   Medical Decision Making  Medically screening exam initiated at 12:45 PM.  Appropriate orders placed.  Martina Sinner was informed that the remainder of the evaluation will be completed by another provider, this initial triage assessment does not replace that evaluation, and the importance of remaining in the ED until their evaluation is complete.  Note: Portions of this report may have been transcribed using voice recognition software. Every effort was made to ensure accuracy; however, inadvertent computerized transcription errors may be present    Lorin Glass, PA-C 06/05/21 1246

## 2021-06-05 NOTE — Assessment & Plan Note (Signed)
New since covid per niece Also very stressed with loss of niece last week  SW consult  F/u for memory testing outpatient

## 2021-06-05 NOTE — ED Provider Notes (Signed)
[facility] EMERGENCY DEPARTMENT Provider Note   CSN: [id number] Arrival date & time: 06/05/21  1221     History  Chief Complaint  Patient presents with   Chest Pain    Adrian Ellis is a 81 y.o. male presenting for evaluation of chest pain.  Patient states he has had chest pain for several days.  Nothing makes it better or worse.  No associated nausea or vomiting.  No shortness of breath.  It is not clear despite asking the patient multiple times, but it appears he may have had COVID several weeks ago.  This is not documented in our system.  HPI     Home Medications Prior to Admission medications   Medication Sig Start Date End Date Taking? Authorizing Provider  amlodipine-olmesartan (AZOR) 10-20 MG tablet Take 1 tablet by mouth daily. 10/31/17   [provider]  aspirin 81 MG chewable tablet Chew 81 mg by mouth daily.    [provider]  aspirin EC 81 MG tablet Take 81 mg by mouth daily.    [provider]  clotrimazole-betamethasone (LOTRISONE) cream APPLY BETWEEN TOES TWICE A DAY 08/25/17   [provider]  clotrimazole-betamethasone (LOTRISONE) cream Apply 1 application topically as needed (between toes).  08/25/17   [provider]  finasteride (PROSCAR) 5 MG tablet Take 5 mg by mouth daily.    [provider]  finasteride (PROSCAR) 5 MG tablet Take 5 mg by mouth daily. 10/28/17   [provider]  FLUAD 0.5 ML SUSY ADM 0.5ML IM UTD 03/11/18   [provider]  hydrochlorothiazide (HYDRODIURIL) 12.5 MG tablet Take 1 tablet (12.5 mg total) by mouth daily. 05/01/21   Jaynee Eagles, PA-C  levothyroxine (SYNTHROID) 125 MCG tablet Take 125 mcg by mouth daily. 04/10/21   [provider]  levothyroxine (SYNTHROID, LEVOTHROID) 112 MCG tablet Take 125 mcg by mouth daily before breakfast.    [provider]  levothyroxine (SYNTHROID, LEVOTHROID) 112 MCG tablet Take 112 mcg by mouth  daily. 09/29/17   [provider]  Multiple Vitamin (MULTIVITAMIN WITH MINERALS) TABS Take 1 tablet by mouth daily.    [provider]  Multiple Vitamins-Minerals (MULTIVITAMIN WITH MINERALS) tablet Take 1 tablet by mouth daily.    [provider]  tamsulosin (FLOMAX) 0.4 MG CAPS capsule Take 0.4 mg by mouth daily.    [provider]  tamsulosin (FLOMAX) 0.4 MG CAPS capsule Take 0.4 mg by mouth at bedtime. 09/07/17   [provider]      Allergies    Patient has no known allergies.    Review of Systems   Review of Systems  Cardiovascular:  Positive for chest pain.  All other systems reviewed and are negative.  Physical Exam Updated Vital Signs BP (!) 161/72 (BP Location: Right Arm)    Pulse 71    Temp 98.2 F (36.8 C) (Oral)    Resp 17    SpO2 100%  Physical Exam Vitals and nursing note reviewed.  Constitutional:      General: He is not in acute distress.    Appearance: Normal appearance.     Comments: nontoxic  HENT:     Head: Normocephalic and atraumatic.  Eyes:     Conjunctiva/sclera: Conjunctivae normal.     Pupils: Pupils are equal, round, and reactive to light.  Cardiovascular:     Rate and Rhythm: Normal rate and regular rhythm.     Pulses: Normal pulses.  Pulmonary:  Effort: Pulmonary effort is normal. No respiratory distress.     Breath sounds: Normal breath sounds. No wheezing.     Comments: Speaking in full sentences.  Clear lung sounds in all fields. Abdominal:     General: There is no distension.     Palpations: Abdomen is soft. There is no mass.     Tenderness: There is no abdominal tenderness. There is no guarding or rebound.  Musculoskeletal:        General: Normal range of motion.     Cervical back: Normal range of motion and neck supple.     Right lower leg: No edema.     Left lower leg: No edema.  Skin:    General: Skin is warm and dry.     Capillary Refill: Capillary refill takes less than 2 seconds.   Neurological:     Mental Status: He is alert and oriented to person, place, and time.  Psychiatric:        Mood and Affect: Mood and affect normal.        Speech: Speech normal.        Behavior: Behavior normal.    ED Results / Procedures / Treatments   Labs (all labs ordered are listed, but only abnormal results are displayed) Labs Reviewed  BASIC METABOLIC PANEL - Abnormal; Notable for the following components:      Result Value   Sodium 128 (*)    Chloride 93 (*)    Glucose, Bld 138 (*)    Creatinine, Ser 1.30 (*)    GFR, Estimated 55 (*)    All other components within normal limits  CBC - Abnormal; Notable for the following components:   WBC 11.8 (*)    Hemoglobin 12.9 (*)    HCT 38.6 (*)    All other components within normal limits  RESP PANEL BY RT-PCR (FLU A&B, COVID) ARPGX2  SODIUM, URINE, RANDOM  OSMOLALITY, URINE  TSH  HEMOGLOBIN A1C  TROPONIN I (HIGH SENSITIVITY)  TROPONIN I (HIGH SENSITIVITY)    EKG EKG Interpretation  Date/Time:  Wednesday June 05 2021 12:31:50 EST Ventricular Rate:  90 PR Interval:  198 QRS Duration: 82 QT Interval:  328 QTC Calculation: 401 R Axis:   54 Text Interpretation: Normal sinus rhythm Normal ECG When compared with ECG of 25-Feb-2015 20:21, PREVIOUS ECG IS PRESENT Confirmed by Lennice Sites ([id number]) on 06/05/2021 1:52:37 PM  Radiology DG Chest 2 View  Result Date: 06/05/2021 CLINICAL DATA:  Chest pain EXAM: CHEST - 2 VIEW COMPARISON:  07/18/2004 FINDINGS: The heart size and mediastinal contours are within normal limits. Both lungs are clear. The visualized skeletal structures are unremarkable. IMPRESSION: No active cardiopulmonary disease. Electronically Signed   By: Franchot Gallo M.D.   On: 06/05/2021 12:59    Procedures Procedures    Medications Ordered in ED Medications - No data to display  ED Course/ Medical Decision Making/ A&P                           Medical Decision Making Amount and/or Complexity of  Data Reviewed Labs: ordered. Radiology: ordered.  Risk Decision regarding hospitalization.    This patient presents to the ED for concern of CP. This involves a number of treatment options, and is a complaint that carries with it a high risk of complications and morbidity.  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  Lab Tests:  I ordered, and personally interpreted labs.  The pertinent results include: For troponin negative, second pending.  There is hyponatremia when compared to his baseline, however kidney function is at baseline.   Imaging Studies:  I ordered imaging studies including cxr I independently visualized and interpreted imaging which showed no pna, pnx, effusion I agree with the radiologist interpretation   Cardiac Monitoring:  The patient was maintained on a cardiac monitor.  I personally viewed and interpreted the cardiac monitored which showed an underlying rhythm of: nsr   Social Determinants of Health:  pt lives at home by himself   Disposition:  After consideration of the diagnostic results and the patients response to treatment, I feel that the patent would benefit from admission for chest pain work-up, considering patient's age, risk factors (with a heart score of 5 (moderate risk)), and no previous cardiac workup.  Discussed with patient, who states he agrees that he needs to be in the hospital since he does not feel well.  Discussed with patient's niece, Deirdre Priest, who states patient has been complaining of not feeling well for several weeks, and she is reassured that he is coming into the hospital.  Discussed with Dr. Rogers Blocker from [facility]ist service.  Discussed my concern about patient's ability to follow-up outpatient as well as his lack of previous cardiac work-up and a heart score 5.  Patient to be admitted.   Final Clinical Impression(s) / ED Diagnoses Final diagnoses:  Chest pain, unspecified type  Hyponatremia    Rx / DC Orders ED Discharge Orders     None         Franchot Heidelberg, PA-C 06/05/21 1543    Lennice Sites, DO 06/05/21 1603

## 2021-06-05 NOTE — Assessment & Plan Note (Signed)
S/p radiation

## 2021-06-05 NOTE — ED Triage Notes (Signed)
Pt reports centralized c/p x 1 week.   HX: HTN

## 2021-06-05 NOTE — H&P (Signed)
History and Physical    Adrian Ellis TKW:[id number] DOB: 05/01/1941 DOA: 06/05/2021  PCP: Iona Beard, MD Consultants:  urology: [facility], can't remember who he sees.  Patient coming from:  Home - lives alone  Chief Complaint: chest pain  HPI: Adrian Ellis is a 81 y.o. male with medical history significant of CKD stage II, GERd, HTN, hypothyroidism, hx of prostate cancer s/p radiation treatment, chronic mesenteric ischemia who presented to ED with complaints of chest pain. He is a poor historian. He states he started to have chest pain in the middle of his chest. He describes the pain as "light." Intermittent in nature and rated as a 5/10. No radiation of the pain. No nausea or vomiting. He did have some sweating with it, no shortness of breath. He states the pain is gone at this time. He denies any reproducible pain. He denies any orthopnea. Food hasn't made it worse. Nothing else makes it better or worse.   Called and talked to his niece, Adrian Ellis, who tells me he had covid 12/17 and hasn't felt well since that time. Today was the first day she has heard him complain of chest pain. He did have a niece die about one week ago and he is stressed. He has also been a little bit more confused since he had covid, but she is not sure. He does have a learning disability at baseline.   He denies any fever/chills, he has been congested. Denies any palpitations, shortness of breath, cough, abdominal pain, N/V/D, dysuria or other urinary symptoms, leg swelling.   ED Course: vitals: afebrile, bp: 174/57, HR: 89, RR: 17, oxygen: 99% on RA Pertinent labs: wbc: 11.8, hgb: 12.9, sodium: 128, creatinine: 1.30 (1.30 is baseline) troponin 4>pending CXR: clear In ED: [facility] asked to admit   Review of Systems: As per HPI; otherwise review of systems reviewed and negative.   Ambulatory Status:  Ambulates without assistance   Past Medical History:  Diagnosis Date   Arthritis    "right shoulder"  (02/25/2017)   CKD (chronic kidney disease), stage II    GERD (gastroesophageal reflux disease)    Hypertension    Hypothyroidism    Lower urinary tract symptoms (LUTS)    Microhematuria    history of, work-up in 2009 negative   Prostate cancer ([place]) 02/23/14   Ellis 3+4=7, volume 25.98 cc   Prostate enlargement    Sinus problem    Thyroid disease    Type 2 diabetes mellitus without complication ([place]) 02/09/2682    Past Surgical History:  Procedure Laterality Date   CATARACT EXTRACTION W/ INTRAOCULAR LENS  IMPLANT, BILATERAL Bilateral    COLONOSCOPY  2004   ESOPHAGOGASTRODUODENOSCOPY N/A 02/26/2017   Procedure: ESOPHAGOGASTRODUODENOSCOPY (EGD);  Surgeon: Carol Ada, MD;  Location: [facility];  Service: Endoscopy;  Laterality: N/A;   FLEXIBLE SIGMOIDOSCOPY N/A 03/09/2015   Procedure: FLEXIBLE SIGMOIDOSCOPY;  Surgeon: Carol Ada, MD;  Location: [facility];  Service: Endoscopy;  Laterality: N/A;   FLEXIBLE SIGMOIDOSCOPY N/A 02/26/2017   Procedure: FLEXIBLE SIGMOIDOSCOPY;  Surgeon: Carol Ada, MD;  Location: [facility];  Service: Endoscopy;  Laterality: N/A;   PROSTATE BIOPSY  02/23/2014   Ellis 7, volume 25.98 cc   TONSILLECTOMY  1953    Social History   Socioeconomic History   Marital status: Single    Spouse name: Not on file   Number of children: Not on file   Years of education: Not on file   Highest education level: Not on file  Occupational History  Not on file  Tobacco Use   Smoking status: Former    Packs/day: 0.25    Years: 30.00    Pack years: 7.50    Types: Cigarettes   Smokeless tobacco: Never   Tobacco comments:    02/25/2017 'quit smoking in the 1990s"  Vaping Use   Vaping Use: Never used  Substance and Sexual Activity   Alcohol use: Not Currently   Drug use: Never   Sexual activity: Not on file  Other Topics Concern   Not on file  Social History Narrative   ** Merged History Encounter **       Social Determinants of Health    Financial Resource Strain: Not on file  Food Insecurity: Not on file  Transportation Needs: Not on file  Physical Activity: Not on file  Stress: Not on file  Social Connections: Not on file  Intimate Partner Violence: Not on file    Allergies  Allergen Reactions   Penicillin G     Other reaction(s): Unknown    Family History  Problem Relation Age of Onset   Aneurysm Mother    Cancer Brother     Prior to Admission medications   Medication Sig Start Date End Date Taking? Authorizing Provider  amlodipine-olmesartan (AZOR) 10-20 MG tablet Take 1 tablet by mouth daily. 10/31/17   [provider]  aspirin 81 MG chewable tablet Chew 81 mg by mouth daily.    [provider]  aspirin EC 81 MG tablet Take 81 mg by mouth daily.    [provider]  clotrimazole-betamethasone (LOTRISONE) cream APPLY BETWEEN TOES TWICE A DAY 08/25/17   [provider]  clotrimazole-betamethasone (LOTRISONE) cream Apply 1 application topically as needed (between toes).  08/25/17   [provider]  finasteride (PROSCAR) 5 MG tablet Take 5 mg by mouth daily.    [provider]  finasteride (PROSCAR) 5 MG tablet Take 5 mg by mouth daily. 10/28/17   [provider]  FLUAD 0.5 ML SUSY ADM 0.5ML IM UTD 03/11/18   [provider]  hydrochlorothiazide (HYDRODIURIL) 12.5 MG tablet Take 1 tablet (12.5 mg total) by mouth daily. 05/01/21   Jaynee Eagles, PA-C  levothyroxine (SYNTHROID) 125 MCG tablet Take 125 mcg by mouth daily. 04/10/21   [provider]  levothyroxine (SYNTHROID, LEVOTHROID) 112 MCG tablet Take 125 mcg by mouth daily before breakfast.    [provider]  levothyroxine (SYNTHROID, LEVOTHROID) 112 MCG tablet Take 112 mcg by mouth daily. 09/29/17   [provider]  Multiple Vitamin (MULTIVITAMIN WITH MINERALS) TABS Take 1 tablet by mouth daily.    [provider]  Multiple Vitamins-Minerals (MULTIVITAMIN  WITH MINERALS) tablet Take 1 tablet by mouth daily.    [provider]  tamsulosin (FLOMAX) 0.4 MG CAPS capsule Take 0.4 mg by mouth daily.    [provider]  tamsulosin (FLOMAX) 0.4 MG CAPS capsule Take 0.4 mg by mouth at bedtime. 09/07/17   [provider]    Physical Exam: Vitals:   06/05/21 1504 06/05/21 1640 06/05/21 1700 06/05/21 1730  BP: (!) 161/72 (!) 194/72 (!) 176/65 (!) 179/77  Pulse: 71 73 72 77  Resp: 17 17 16  (!) 23  Temp:      TempSrc:      SpO2: 100% 100% 100% 98%     General:  Appears calm and comfortable and is in NAD Eyes:  PERRL, EOMI, normal lids, iris ENT:  grossly normal hearing, lips & tongue, mmm; appropriate dentition  Neck:  no LAD, masses or thyromegaly; no carotid bruits Cardiovascular:  RRR, quiet systolic murmur.  No LE edema. No reproducible pain. No friction rub  Respiratory:   CTA bilaterally with no wheezes/rales/rhonchi.  Normal respiratory effort. Abdomen:  soft, NT, ND, NABS Back:   normal alignment, no CVAT Skin:  no rash or induration seen on limited exam Musculoskeletal:  grossly normal tone BUE/BLE, good ROM, no bony abnormality Lower extremity:  No LE edema.  Limited foot exam with no ulcerations.  2+ distal pulses. Psychiatric:  grossly normal mood and affect, speech fluent and appropriate, AOx3 Neurologic:  CN 2-12 grossly intact, moves all extremities in coordinated fashion, sensation intact    Radiological Exams on Admission: Independently reviewed - see discussion in A/P where applicable  DG Chest 2 View  Result Date: 06/05/2021 CLINICAL DATA:  Chest pain EXAM: CHEST - 2 VIEW COMPARISON:  07/18/2004 FINDINGS: The heart size and mediastinal contours are within normal limits. Both lungs are clear. The visualized skeletal structures are unremarkable. IMPRESSION: No active cardiopulmonary disease. Electronically Signed   By: Franchot Gallo M.D.   On: 06/05/2021 12:59    EKG: Independently reviewed.  NSR  with rate 90; nonspecific ST changes with no evidence of acute ischemia   Labs on Admission: I have personally reviewed the available labs and imaging studies at the time of the admission.  Pertinent labs:  wbc: 11.8,  hgb: 12.9,  sodium: 128,  creatinine: 1.30 (1.30 is baseline)  troponin 4>pending   Assessment/Plan * Chest pain- (present on admission) 81 year old male presenting with 2-3 day history of chest pain. Heart score of 5 with risk factors of age, T2DM and recent covid infection.  -place on telemetry -troponin wnl x 2 and chest pain has resolved. ekg reassuring  -with recent covid will check inflammatory markers and echo, but has no rub or change in pain with position.  -recent stress with loss of niece  -consult cards tomorrow per day team   Hyponatremia- (present on admission) ? If medication induced. Holding home  hctz Strict I/o Urine studies/TSH/labs pending Continue to trend   Type 2 diabetes mellitus without complication ([place]) Newly diagnosed in 2019, no f/u labs that can be seen Repeat a1c SSI and accuchecks per protocol   Hypertension- (present on admission) Above goal Continue amlodipine-olmesartan and could increase the olmesartan to 40mg  if needed  Hold hctz with hyponatremia Adjust home meds as indicated Prn hydralazine    Chronic kidney disease- (present on admission) Unsure of stage, ? 2-3 Baseline around 1.3.  Continue to monitor   Hypothyroidism- (present on admission) Repeat TSH-suppressed Decreased synthroid down to 13mcg, check T4 Repeat labs in 6-8 weeks   Malignant neoplasm of prostate ([place])- (present on admission) S/p radiation  Memory loss- (present on admission) New since covid per niece Also very stressed with loss of niece last week  SW consult  F/u for memory testing outpatient      There is no height or weight on file to calculate BMI.   Level of care: Telemetry Cardiac DVT prophylaxis:  Lovenox Code Status:   Full - confirmed with patient Family Communication: None present; I spoke with the patient's niece by telephone at the time of admission. Adrian Ellis [phone] Disposition Plan:  The patient is from: home  Anticipated d/c is to: per day team  Patient placed in observation as anticipate less than 2 midnight stay. Requires hospitalization for work up for chest pain, constant monitoring, imaging and MDM  with specialists. Not safe to go home at this time.    Patient is currently: stable Consults called: none  Admission status:  observation    Orma Flaming MD [facility]   How to contact the [facility] Attending or Consulting provider [place] or covering provider during after hours [place], for this patient?  Check the care team in [facility] and look for a) attending/consulting [facility] provider listed and b) the [facility] team listed Log into [email or web address] and use [facility]'s universal password to access. If you do not have the password, please contact the hospital operator. Locate the [facility] provider you are looking for under [facility] and page to a number that you can be directly reached. If you still have difficulty reaching the provider, please page the [facility] (Director on Call) for the Hospitalists listed on amion for assistance.   06/05/2021, 8:30 PM

## 2021-06-05 NOTE — Assessment & Plan Note (Signed)
?   If medication induced. Holding home  hctz Strict I/o Urine studies/TSH/labs pending Continue to trend

## 2021-06-05 NOTE — Assessment & Plan Note (Addendum)
Above goal Continue amlodipine-olmesartan and could increase the olmesartan to 40mg  if needed  Hold hctz with hyponatremia Adjust home meds as indicated Prn hydralazine

## 2021-06-05 NOTE — Assessment & Plan Note (Addendum)
81 year old male presenting with 2-3 day history of chest pain. Heart score of 5 with risk factors of age, T2DM and recent covid infection.  -place on telemetry -troponin wnl x 2 and chest pain has resolved. ekg reassuring  -with recent covid will check inflammatory markers and echo, but has no rub or change in pain with position.  -recent stress with loss of niece  -consult cards tomorrow per day team

## 2021-06-05 NOTE — Assessment & Plan Note (Signed)
Newly diagnosed in 2019, no f/u labs that can be seen Repeat a1c SSI and accuchecks per protocol

## 2021-06-05 NOTE — Assessment & Plan Note (Addendum)
Repeat TSH-suppressed Decreased synthroid down to 152mcg, check T4 Repeat labs in 6-8 weeks

## 2021-06-05 NOTE — Assessment & Plan Note (Signed)
Unsure of stage, ? 2-3 Baseline around 1.3.  Continue to monitor

## 2021-06-06 ENCOUNTER — Observation Stay (HOSPITAL_BASED_OUTPATIENT_CLINIC_OR_DEPARTMENT_OTHER): Payer: Medicare Other

## 2021-06-06 ENCOUNTER — Encounter (HOSPITAL_COMMUNITY): Payer: Self-pay | Admitting: Family Medicine

## 2021-06-06 DIAGNOSIS — R079 Chest pain, unspecified: Secondary | ICD-10-CM | POA: Diagnosis not present

## 2021-06-06 LAB — BASIC METABOLIC PANEL
Anion gap: 9 (ref 5–15)
BUN: 14 mg/dL (ref 8–23)
CO2: 23 mmol/L (ref 22–32)
Calcium: 9.4 mg/dL (ref 8.9–10.3)
Chloride: 96 mmol/L — ABNORMAL LOW (ref 98–111)
Creatinine, Ser: 1.17 mg/dL (ref 0.61–1.24)
GFR, Estimated: >60 mL/min (ref >60)
Glucose, Bld: 109 mg/dL — ABNORMAL HIGH (ref 70–99)
Potassium: 4.5 mmol/L (ref 3.5–5.1)
Sodium: 128 mmol/L — ABNORMAL LOW (ref 135–145)

## 2021-06-06 LAB — CBC
HCT: 36.1 % — ABNORMAL LOW (ref 39.0–52.0)
Hemoglobin: 12.8 g/dL — ABNORMAL LOW (ref 13.0–17.0)
MCH: 31.3 pg (ref 26.0–34.0)
MCHC: 35.5 g/dL (ref 30.0–36.0)
MCV: 88.3 fL (ref 80.0–100.0)
Platelets: 270 10*3/uL (ref 150–400)
RBC: 4.09 MIL/uL — ABNORMAL LOW (ref 4.22–5.81)
RDW: 12.2 % (ref 11.5–15.5)
WBC: 10.7 10*3/uL — ABNORMAL HIGH (ref 4.0–10.5)
nRBC: 0 % (ref 0.0–0.2)

## 2021-06-06 LAB — ECHOCARDIOGRAM COMPLETE
AR max vel: 1.99 cm2
AV Area VTI: 2.36 cm2
AV Area mean vel: 2.15 cm2
AV Mean grad: 4 mmHg
AV Peak grad: 8.3 mmHg
Ao pk vel: 1.44 m/s
Area-P 1/2: 3.07 cm2
Height: 72 in
S' Lateral: 2.5 cm
Weight: 3488 oz

## 2021-06-06 LAB — T4, FREE: Free T4: 1.76 ng/dL — ABNORMAL HIGH (ref 0.61–1.12)

## 2021-06-06 MED ORDER — LEVOTHYROXINE SODIUM 100 MCG PO TABS: 100.0000 ug | ORAL_TABLET | Freq: Every day | ORAL | 2 refills | Status: AC

## 2021-06-06 NOTE — ED Notes (Signed)
Patient walked in hallway, stated he feel much better.

## 2021-06-06 NOTE — ED Notes (Signed)
AVS with prescriptions provided to and discussed with patient. Pt verbalizes understanding of discharge instructions and denies any questions or concerns at this time. Pt ambulated to lobby with steady gait to wait on ride home.

## 2021-06-06 NOTE — ED Notes (Signed)
Per MD, pt is going to be discharged. Will notify bed placement to give bed to someone else.

## 2021-06-06 NOTE — Progress Notes (Signed)
°   06/06/21 1145  TOC ED Mini Assessment  TOC Time spent with patient (minutes): 45  TOC Time saved using PING (minutes): 30  PING Used in TOC Assessment Yes  Admission or Readmission Diverted No  What brought you to the Emergency Department?  con  Barriers to Discharge Continued Medical Work up  [employer] PT evaluation  Means of departure Car   RNCM placed order for PT/OT eval and treat if indicated order to access safety at home alone.

## 2021-06-06 NOTE — Care Management Obs Status (Signed)
[facility] NOTIFICATION   Patient Details  Name: Reinhart Saulters MRN: [id number] Date of Birth: 07/05/1940   Medicare Observation Status Notification Given:  Yes    Fuller Mandril, RN 06/06/2021, 10:41 AM

## 2021-06-06 NOTE — Discharge Summary (Addendum)
[facility]  Physician Discharge Summary   Patient ID: Adrian Ellis MRN: [id number] DOB/AGE: 82/07/1940 82 y.o.  Admit date: 06/05/2021 Discharge date: 06/06/2021    PCP: Iona Beard, MD  DISCHARGE DIAGNOSES:  Chest pain, ruled out for ACS Hyponatremia Diabetes mellitus type 2 without complications Essential hypertension Chronic kidney disease unspecified stage Hypothyroidism History of prostate cancer  RECOMMENDATIONS FOR OUTPATIENT FOLLOW UP: Patient encouraged to follow-up with his PCP for further evaluation of his chest discomfort.  Patient mentioned that he has an appointment with Dr. Berdine Addison on 1/23. Will need to have his sodium level rechecked in a week   Home Health: None Equipment/Devices: None  CODE STATUS: Full code  DISCHARGE CONDITION: fair  Diet recommendation: As before  INITIAL HISTORY: As per H&P:  "81 y.o. male with medical history significant of CKD stage II, GERd, HTN, hypothyroidism, hx of prostate cancer s/p radiation treatment, chronic mesenteric ischemia who presented to ED with complaints of chest pain. He is a poor historian. He states he started to have chest pain in the middle of his chest. He describes the pain as "light." Intermittent in nature and rated as a 5/10. No radiation of the pain. No nausea or vomiting. He did have some sweating with it, no shortness of breath. He states the pain is gone at this time. He denies any reproducible pain. He denies any orthopnea. Food hasn't made it worse. Nothing else makes it better or worse.    Called and talked to his niece, [name], who tells me he had covid 12/17 and hasn't felt well since that time. Today was the first day she has heard him complain of chest pain. He did have a niece die about one week ago and he is stressed. He has also been a little bit more confused since he had covid, but she is not sure. He does have a learning disability at baseline.    He denies any fever/chills, he has  been congested. Denies any palpitations, shortness of breath, cough, abdominal pain, N/V/D, dysuria or other urinary symptoms, leg swelling.    ED Course: vitals: afebrile, bp: 174/57, HR: 89, RR: 17, oxygen: 99% on RA Pertinent labs: wbc: 11.8, hgb: 12.9, sodium: 128, creatinine: 1.30 (1.30 is baseline) troponin 4>pending CXR: clear In ED: [facility] asked to admit"    HOSPITAL COURSE:   Patient was admitted for evaluation of chest pain.  He was noted to have risk factors including hypertension and diabetes.  However his symptom onset was in the setting of significant stress recently with the recent passing of his niece.  Patient ruled out for ACS.  EKG did not show any ischemic changes.  Echocardiogram was done which showed normal systolic function without any regional wall motion abnormalities.  He was ambulated in the hallway without any recurrence of symptoms.  He felt much better on the day of discharge.  Cleared for discharge.  He was asked to continue to hold his HCTZ due to hyponatremia.  This will need to be followed up in the outpatient setting.  He has other medical issues including hypertension, chronic kidney disease, hypothyroidism, prostate cancer overall stable.  All of the above was also discussed with patient's niece.  Okay for discharge home today.    PERTINENT LABS:  The results of significant diagnostics from this hospitalization (including imaging, microbiology, ancillary and laboratory) are listed below for reference.    Microbiology: Recent Results (from the past 240 hour(s))  Resp Panel by RT-PCR (Flu A&B, Covid) Nasopharyngeal  Swab     Status: None   Collection Time: 06/05/21  3:04 PM   Specimen: Nasopharyngeal Swab; Nasopharyngeal(NP) swabs in vial transport medium  Result Value Ref Range Status   SARS Coronavirus 2 by RT PCR NEGATIVE NEGATIVE Final    Comment: (NOTE) SARS-CoV-2 target nucleic acids are NOT DETECTED.  The SARS-CoV-2 RNA is generally  detectable in upper respiratory specimens during the acute phase of infection. The lowest concentration of SARS-CoV-2 viral copies this assay can detect is 138 copies/mL. A negative result does not preclude SARS-Cov-2 infection and should not be used as the sole basis for treatment or other patient management decisions. A negative result may occur with  improper specimen collection/handling, submission of specimen other than nasopharyngeal swab, presence of viral mutation(s) within the areas targeted by this assay, and inadequate number of viral copies(<138 copies/mL). A negative result must be combined with clinical observations, patient history, and epidemiological information. The expected result is Negative.  Fact Sheet for Patients:  EntrepreneurPulse.com.au  Fact Sheet for Healthcare Providers:  IncredibleEmployment.be  This test is no t yet approved or cleared by the [place] FDA and  has been authorized for detection and/or diagnosis of SARS-CoV-2 by FDA under an Emergency Use Authorization (EUA). This EUA will remain  in effect (meaning this test can be used) for the duration of the COVID-19 declaration under Section 564(b)(1) of the Act, 21 U.S.C.section 360bbb-3(b)(1), unless the authorization is terminated  or revoked sooner.       Influenza A by PCR NEGATIVE NEGATIVE Final   Influenza B by PCR NEGATIVE NEGATIVE Final    Comment: (NOTE) The Xpert Xpress SARS-CoV-2/FLU/RSV plus assay is intended as an aid in the diagnosis of influenza from Nasopharyngeal swab specimens and should not be used as a sole basis for treatment. Nasal washings and aspirates are unacceptable for Xpert Xpress SARS-CoV-2/FLU/RSV testing.  Fact Sheet for Patients: EntrepreneurPulse.com.au  Fact Sheet for Healthcare Providers: IncredibleEmployment.be  This test is not yet approved or cleared by the [place] FDA  and has been authorized for detection and/or diagnosis of SARS-CoV-2 by FDA under an Emergency Use Authorization (EUA). This EUA will remain in effect (meaning this test can be used) for the duration of the COVID-19 declaration under Section 564(b)(1) of the Act, 21 U.S.C. section 360bbb-3(b)(1), unless the authorization is terminated or revoked.  Performed at [facility], [place] [address] [place], [place] [address]      Labs:  COVID-19 Labs  Recent Labs    06/05/21 2150  CRP 0.5    Lab Results  Component Value Date   [place] NEGATIVE 06/05/2021      Basic Metabolic Panel: Recent Labs  Lab 06/05/21 1238 06/06/21 0352  NA 128* 128*  K 4.2 4.5  CL 93* 96*  CO2 24 23  GLUCOSE 138* 109*  BUN 14 14  CREATININE 1.30* 1.17  CALCIUM 9.6 9.4    CBC: Recent Labs  Lab 06/05/21 1238 06/06/21 0352  WBC 11.8* 10.7*  HGB 12.9* 12.8*  HCT 38.6* 36.1*  MCV 90.4 88.3  PLT 287 270      IMAGING STUDIES DG Chest 2 View  Result Date: 06/05/2021 CLINICAL DATA:  Chest pain EXAM: CHEST - 2 VIEW COMPARISON:  07/18/2004 FINDINGS: The heart size and mediastinal contours are within normal limits. Both lungs are clear. The visualized skeletal structures are unremarkable. IMPRESSION: No active cardiopulmonary disease. Electronically Signed   By: Franchot Gallo M.D.   On: 06/05/2021 12:59  ECHOCARDIOGRAM COMPLETE  Result Date: 06/06/2021    ECHOCARDIOGRAM REPORT   Patient Name:   SANTEZ WOODCOX Date of Exam: 06/06/2021 Medical Rec #:  [id number]      Height:       72.0 in Accession #:    [id number]     Weight:       218.0 lb Date of Birth:  06-11-1940       BSA:          2.210 m Patient Age:    39 years       BP:           129/60 mmHg Patient Gender: M              HR:           129 bpm. Exam Location:  Inpatient Procedure: 2D Echo Indications:    R07.9* Chest pain, unspecified  History:        Patient has no prior history of Echocardiogram examinations.  Sonographer:     Glo Herring Referring Phys: [place]  1. Left ventricular ejection fraction, by estimation, is 60 to 65%. The left ventricle has normal function. The left ventricle has no regional wall motion abnormalities. Left ventricular diastolic parameters are consistent with Grade I diastolic dysfunction (impaired relaxation).  2. Right ventricular systolic function is normal. The right ventricular size is normal.  3. The mitral valve is normal in structure. Trivial mitral valve regurgitation. No evidence of mitral stenosis.  4. The aortic valve is tricuspid. Aortic valve regurgitation is not visualized. No aortic stenosis is present.  5. Aortic dilatation noted. There is borderline dilatation of the ascending aorta, measuring 38 mm.  6. The inferior vena cava is normal in size with greater than 50% respiratory variability, suggesting right atrial pressure of 3 mmHg. Comparison(s): No prior Echocardiogram. FINDINGS  Left Ventricle: Left ventricular ejection fraction, by estimation, is 60 to 65%. The left ventricle has normal function. The left ventricle has no regional wall motion abnormalities. The left ventricular internal cavity size was normal in size. There is  no left ventricular hypertrophy. Left ventricular diastolic parameters are consistent with Grade I diastolic dysfunction (impaired relaxation). Right Ventricle: The right ventricular size is normal. Right ventricular systolic function is normal. Left Atrium: Left atrial size was normal in size. Right Atrium: Right atrial size was normal in size. Pericardium: There is no evidence of pericardial effusion. Mitral Valve: The mitral valve is normal in structure. Trivial mitral valve regurgitation. No evidence of mitral valve stenosis. Tricuspid Valve: The tricuspid valve is normal in structure. Tricuspid valve regurgitation is mild . No evidence of tricuspid stenosis. Aortic Valve: The aortic valve is tricuspid. Aortic valve regurgitation is  not visualized. No aortic stenosis is present. Aortic valve mean gradient measures 4.0 mmHg. Aortic valve peak gradient measures 8.3 mmHg. Aortic valve area, by VTI measures 2.36 cm. Pulmonic Valve: The pulmonic valve was not well visualized. Pulmonic valve regurgitation is not visualized. No evidence of pulmonic stenosis. Aorta: Aortic dilatation noted. There is borderline dilatation of the ascending aorta, measuring 38 mm. Venous: The inferior vena cava is normal in size with greater than 50% respiratory variability, suggesting right atrial pressure of 3 mmHg. IAS/Shunts: No atrial level shunt detected by color flow Doppler.  LEFT VENTRICLE PLAX 2D LVIDd:         4.40 cm   Diastology LVIDs:         2.50 cm   LV e' medial:  7.51 cm/s LV PW:         1.10 cm   LV E/e' medial:  10.3 LV IVS:        1.10 cm   LV e' lateral:   9.57 cm/s LVOT diam:     2.00 cm   LV E/e' lateral: 8.0 LV SV:         71 LV SV Index:   32 LVOT Area:     3.14 cm  RIGHT VENTRICLE             IVC RV Basal diam:  3.20 cm     IVC diam: 1.80 cm RV S prime:     13.30 cm/s LEFT ATRIUM           Index        RIGHT ATRIUM           Index LA diam:      3.00 cm 1.36 cm/m   RA Area:     12.50 cm LA Vol (A2C): 31.5 ml 14.25 ml/m  RA Volume:   25.80 ml  11.67 ml/m  AORTIC VALVE                     PULMONIC VALVE AV Area (Vmax):    1.99 cm      PV Vmax:       0.91 m/s AV Area (Vmean):   2.15 cm      PV Peak grad:  3.3 mmHg AV Area (VTI):     2.36 cm AV Vmax:           144.00 cm/s AV Vmean:          100.000 cm/s AV VTI:            0.300 m AV Peak Grad:      8.3 mmHg AV Mean Grad:      4.0 mmHg LVOT Vmax:         91.00 cm/s LVOT Vmean:        68.300 cm/s LVOT VTI:          0.225 m LVOT/AV VTI ratio: 0.75  AORTA Ao Root diam: 3.20 cm Ao Asc diam:  3.80 cm MITRAL VALVE                TRICUSPID VALVE MV Area (PHT): 3.07 cm     TR Peak grad:   21.9 mmHg MV Decel Time: 247 msec     TR Vmax:        234.00 cm/s MV E velocity: 77.00 cm/s MV A velocity:  106.00 cm/s  SHUNTS MV E/A ratio:  0.73         Systemic VTI:  0.22 m                             Systemic Diam: 2.00 cm Kirk Ruths MD Electronically signed by Kirk Ruths MD Signature Date/Time: 06/06/2021/1:11:15 PM    Final     DISCHARGE EXAMINATION: Vitals:   06/06/21 0931 06/06/21 1000 06/06/21 1300 06/06/21 1336  BP: (!) 156/55 (!) 124/52 (!) 150/66 (!) 155/65  Pulse:  66 80 79  Resp:  17 (!) 22 18  Temp:    98.1 F (36.7 C)  TempSrc:    Oral  SpO2:  98% 98% 100%  Weight:      Height:       General appearance: Awake alert.  In no distress Resp: Clear  to auscultation bilaterally.  Normal effort Cardio: S1-S2 is normal regular.  No S3-S4.  No rubs murmurs or bruit GI: Abdomen is soft.  Nontender nondistended.  Bowel sounds are present normal.  No masses organomegaly    DISPOSITION: Home  Discharge Instructions     Call MD for:  difficulty breathing, headache or visual disturbances   Complete by: As directed    Call MD for:  extreme fatigue   Complete by: As directed    Call MD for:  hives   Complete by: As directed    Call MD for:  persistant dizziness or light-headedness   Complete by: As directed    Call MD for:  persistant nausea and vomiting   Complete by: As directed    Call MD for:  severe uncontrolled pain   Complete by: As directed    Call MD for:  temperature >100.4   Complete by: As directed    Diet - low sodium heart healthy   Complete by: As directed    Discharge instructions   Complete by: As directed    Please take your medications as prescribed.  Please be sure to follow-up with your PCP on Monday as scheduled.  Your primary care provider may refer you to a cardiologist for further testing.  Seek attention if your symptoms recur.  You were cared for by a hospitalist during your hospital stay. If you have any questions about your discharge medications or the care you received while you were in the hospital after you are discharged, you can call  the unit and asked to speak with the hospitalist on call if the hospitalist that took care of you is not available. Once you are discharged, your primary care physician will handle any further medical issues. Please note that NO REFILLS for any discharge medications will be authorized once you are discharged, as it is imperative that you return to your primary care physician (or establish a relationship with a primary care physician if you do not have one) for your aftercare needs so that they can reassess your need for medications and monitor your lab values. If you do not have a primary care physician, you can call [phone] for a physician referral.   Increase activity slowly   Complete by: As directed           Allergies as of 06/06/2021       Reactions   Penicillin G    Other reaction(s): Unknown        Medication List     STOP taking these medications    hydrochlorothiazide 12.5 MG tablet Commonly known as: HYDRODIURIL       TAKE these medications    amlodipine-olmesartan 10-20 MG tablet Commonly known as: AZOR Take 1 tablet by mouth daily.   aspirin 81 MG chewable tablet Chew 81 mg by mouth daily.   clotrimazole-betamethasone cream Commonly known as: LOTRISONE 1 application daily as needed (irritation on toes).   finasteride 5 MG tablet Commonly known as: PROSCAR Take 5 mg by mouth daily.   levothyroxine 100 MCG tablet Commonly known as: SYNTHROID Take 1 tablet (100 mcg total) by mouth daily at 6 (six) AM. What changed:  medication strength how much to take when to take this   tamsulosin 0.4 MG Caps capsule Commonly known as: FLOMAX Take 0.4 mg by mouth at bedtime.          Follow-up Information     Iona Beard, MD. Schedule an appointment as soon  as possible for a visit.   Specialty: Family Medicine Contact information: [address] [place] [address] [phone]                 TOTAL DISCHARGE TIME: 13  minutes  [place]  [facility] Pager on [email or web address]  06/07/2021, 12:13 PM

## 2021-06-10 DIAGNOSIS — R0981 Nasal congestion: Secondary | ICD-10-CM | POA: Diagnosis not present

## 2021-06-10 DIAGNOSIS — R519 Headache, unspecified: Secondary | ICD-10-CM | POA: Diagnosis not present

## 2021-06-17 DIAGNOSIS — E039 Hypothyroidism, unspecified: Secondary | ICD-10-CM | POA: Diagnosis not present

## 2021-06-17 DIAGNOSIS — E7849 Other hyperlipidemia: Secondary | ICD-10-CM | POA: Diagnosis not present

## 2021-06-17 DIAGNOSIS — I10 Essential (primary) hypertension: Secondary | ICD-10-CM | POA: Diagnosis not present

## 2021-06-25 DIAGNOSIS — R519 Headache, unspecified: Secondary | ICD-10-CM | POA: Diagnosis not present

## 2021-06-25 DIAGNOSIS — B353 Tinea pedis: Secondary | ICD-10-CM | POA: Diagnosis not present

## 2021-06-25 DIAGNOSIS — R7303 Prediabetes: Secondary | ICD-10-CM | POA: Diagnosis not present

## 2021-08-14 DIAGNOSIS — H35373 Puckering of macula, bilateral: Secondary | ICD-10-CM | POA: Diagnosis not present

## 2021-08-14 DIAGNOSIS — Z961 Presence of intraocular lens: Secondary | ICD-10-CM | POA: Diagnosis not present

## 2021-08-22 DIAGNOSIS — B353 Tinea pedis: Secondary | ICD-10-CM | POA: Diagnosis not present

## 2021-08-22 DIAGNOSIS — R519 Headache, unspecified: Secondary | ICD-10-CM | POA: Diagnosis not present

## 2021-08-22 DIAGNOSIS — R7303 Prediabetes: Secondary | ICD-10-CM | POA: Diagnosis not present

## 2021-08-22 DIAGNOSIS — E782 Mixed hyperlipidemia: Secondary | ICD-10-CM | POA: Diagnosis not present

## 2021-10-24 DIAGNOSIS — R319 Hematuria, unspecified: Secondary | ICD-10-CM | POA: Diagnosis not present

## 2021-11-15 DIAGNOSIS — E7849 Other hyperlipidemia: Secondary | ICD-10-CM | POA: Diagnosis not present

## 2021-11-15 DIAGNOSIS — I10 Essential (primary) hypertension: Secondary | ICD-10-CM | POA: Diagnosis not present

## 2021-11-25 DIAGNOSIS — E039 Hypothyroidism, unspecified: Secondary | ICD-10-CM | POA: Diagnosis not present

## 2021-11-25 DIAGNOSIS — I10 Essential (primary) hypertension: Secondary | ICD-10-CM | POA: Diagnosis not present

## 2021-11-25 DIAGNOSIS — R7303 Prediabetes: Secondary | ICD-10-CM | POA: Diagnosis not present

## 2021-11-25 DIAGNOSIS — Z0000 Encounter for general adult medical examination without abnormal findings: Secondary | ICD-10-CM | POA: Diagnosis not present

## 2021-11-25 DIAGNOSIS — B353 Tinea pedis: Secondary | ICD-10-CM | POA: Diagnosis not present

## 2022-02-17 DIAGNOSIS — E039 Hypothyroidism, unspecified: Secondary | ICD-10-CM | POA: Diagnosis not present

## 2022-02-17 DIAGNOSIS — B353 Tinea pedis: Secondary | ICD-10-CM | POA: Diagnosis not present

## 2022-02-17 DIAGNOSIS — R7303 Prediabetes: Secondary | ICD-10-CM | POA: Diagnosis not present

## 2022-02-17 DIAGNOSIS — I10 Essential (primary) hypertension: Secondary | ICD-10-CM | POA: Diagnosis not present

## 2022-05-06 DIAGNOSIS — R21 Rash and other nonspecific skin eruption: Secondary | ICD-10-CM | POA: Diagnosis not present

## 2022-05-06 DIAGNOSIS — E1169 Type 2 diabetes mellitus with other specified complication: Secondary | ICD-10-CM | POA: Diagnosis not present

## 2022-05-27 DIAGNOSIS — R21 Rash and other nonspecific skin eruption: Secondary | ICD-10-CM | POA: Diagnosis not present

## 2022-08-26 DIAGNOSIS — B353 Tinea pedis: Secondary | ICD-10-CM | POA: Diagnosis not present

## 2022-08-26 DIAGNOSIS — E039 Hypothyroidism, unspecified: Secondary | ICD-10-CM | POA: Diagnosis not present

## 2022-08-26 DIAGNOSIS — I10 Essential (primary) hypertension: Secondary | ICD-10-CM | POA: Diagnosis not present

## 2022-08-26 DIAGNOSIS — R7303 Prediabetes: Secondary | ICD-10-CM | POA: Diagnosis not present

## 2022-09-10 DIAGNOSIS — Z961 Presence of intraocular lens: Secondary | ICD-10-CM | POA: Diagnosis not present

## 2022-09-10 DIAGNOSIS — H35373 Puckering of macula, bilateral: Secondary | ICD-10-CM | POA: Diagnosis not present

## 2022-11-25 DIAGNOSIS — E781 Pure hyperglyceridemia: Secondary | ICD-10-CM | POA: Diagnosis not present

## 2022-11-25 DIAGNOSIS — B353 Tinea pedis: Secondary | ICD-10-CM | POA: Diagnosis not present

## 2022-11-25 DIAGNOSIS — R7303 Prediabetes: Secondary | ICD-10-CM | POA: Diagnosis not present

## 2022-11-25 DIAGNOSIS — I10 Essential (primary) hypertension: Secondary | ICD-10-CM | POA: Diagnosis not present

## 2022-11-25 DIAGNOSIS — E039 Hypothyroidism, unspecified: Secondary | ICD-10-CM | POA: Diagnosis not present

## 2023-01-12 DIAGNOSIS — W108XXA Fall (on) (from) other stairs and steps, initial encounter: Secondary | ICD-10-CM | POA: Diagnosis not present

## 2023-01-12 DIAGNOSIS — S0003XA Contusion of scalp, initial encounter: Secondary | ICD-10-CM | POA: Diagnosis not present

## 2023-01-20 DIAGNOSIS — S0003XD Contusion of scalp, subsequent encounter: Secondary | ICD-10-CM | POA: Diagnosis not present

## 2023-01-20 DIAGNOSIS — W108XXD Fall (on) (from) other stairs and steps, subsequent encounter: Secondary | ICD-10-CM | POA: Diagnosis not present

## 2023-03-30 DIAGNOSIS — I10 Essential (primary) hypertension: Secondary | ICD-10-CM | POA: Diagnosis not present

## 2023-03-30 DIAGNOSIS — R7303 Prediabetes: Secondary | ICD-10-CM | POA: Diagnosis not present

## 2023-03-30 DIAGNOSIS — E039 Hypothyroidism, unspecified: Secondary | ICD-10-CM | POA: Diagnosis not present

## 2023-03-30 DIAGNOSIS — B353 Tinea pedis: Secondary | ICD-10-CM | POA: Diagnosis not present

## 2023-03-30 DIAGNOSIS — R0981 Nasal congestion: Secondary | ICD-10-CM | POA: Diagnosis not present

## 2023-06-30 DIAGNOSIS — E039 Hypothyroidism, unspecified: Secondary | ICD-10-CM | POA: Diagnosis not present

## 2023-06-30 DIAGNOSIS — I10 Essential (primary) hypertension: Secondary | ICD-10-CM | POA: Diagnosis not present

## 2023-06-30 DIAGNOSIS — B353 Tinea pedis: Secondary | ICD-10-CM | POA: Diagnosis not present

## 2023-06-30 DIAGNOSIS — R7303 Prediabetes: Secondary | ICD-10-CM | POA: Diagnosis not present

## 2023-09-22 DIAGNOSIS — H35373 Puckering of macula, bilateral: Secondary | ICD-10-CM | POA: Diagnosis not present

## 2023-09-22 DIAGNOSIS — Z961 Presence of intraocular lens: Secondary | ICD-10-CM | POA: Diagnosis not present

## 2023-09-28 DIAGNOSIS — R7303 Prediabetes: Secondary | ICD-10-CM | POA: Diagnosis not present

## 2023-09-28 DIAGNOSIS — E039 Hypothyroidism, unspecified: Secondary | ICD-10-CM | POA: Diagnosis not present

## 2023-09-28 DIAGNOSIS — B353 Tinea pedis: Secondary | ICD-10-CM | POA: Diagnosis not present

## 2023-09-28 DIAGNOSIS — Z0000 Encounter for general adult medical examination without abnormal findings: Secondary | ICD-10-CM | POA: Diagnosis not present

## 2023-09-28 DIAGNOSIS — I10 Essential (primary) hypertension: Secondary | ICD-10-CM | POA: Diagnosis not present

## 2024-01-11 DIAGNOSIS — R3121 Asymptomatic microscopic hematuria: Secondary | ICD-10-CM | POA: Diagnosis not present

## 2024-02-01 DIAGNOSIS — Z23 Encounter for immunization: Secondary | ICD-10-CM | POA: Diagnosis not present

## 2024-02-01 DIAGNOSIS — E039 Hypothyroidism, unspecified: Secondary | ICD-10-CM | POA: Diagnosis not present

## 2024-02-01 DIAGNOSIS — B353 Tinea pedis: Secondary | ICD-10-CM | POA: Diagnosis not present

## 2024-02-01 DIAGNOSIS — R7303 Prediabetes: Secondary | ICD-10-CM | POA: Diagnosis not present

## 2024-02-01 DIAGNOSIS — I10 Essential (primary) hypertension: Secondary | ICD-10-CM | POA: Diagnosis not present
